# Patient Record
Sex: Female | Born: 1950 | Race: Black or African American | Hispanic: No | Marital: Single | Smoking: Never smoker
Health system: Southern US, Community
[De-identification: ages and names within clinical notes are randomized; demographics above are authoritative.]

## PROBLEM LIST (undated history)

## (undated) DIAGNOSIS — E876 Hypokalemia: Secondary | ICD-10-CM

## (undated) DIAGNOSIS — D649 Anemia, unspecified: Secondary | ICD-10-CM

## (undated) DIAGNOSIS — M545 Low back pain, unspecified: Secondary | ICD-10-CM

## (undated) DIAGNOSIS — H409 Unspecified glaucoma: Secondary | ICD-10-CM

## (undated) DIAGNOSIS — F32A Depression, unspecified: Secondary | ICD-10-CM

## (undated) DIAGNOSIS — M159 Polyosteoarthritis, unspecified: Secondary | ICD-10-CM

## (undated) DIAGNOSIS — M25579 Pain in unspecified ankle and joints of unspecified foot: Secondary | ICD-10-CM

## (undated) DIAGNOSIS — F438 Other reactions to severe stress: Secondary | ICD-10-CM

## (undated) DIAGNOSIS — M79609 Pain in unspecified limb: Secondary | ICD-10-CM

## (undated) DIAGNOSIS — M109 Gout, unspecified: Secondary | ICD-10-CM

## (undated) DIAGNOSIS — C801 Malignant (primary) neoplasm, unspecified: Secondary | ICD-10-CM

## (undated) DIAGNOSIS — J309 Allergic rhinitis, unspecified: Secondary | ICD-10-CM

## (undated) DIAGNOSIS — T7840XA Allergy, unspecified, initial encounter: Secondary | ICD-10-CM

## (undated) DIAGNOSIS — G5600 Carpal tunnel syndrome, unspecified upper limb: Secondary | ICD-10-CM

## (undated) DIAGNOSIS — E785 Hyperlipidemia, unspecified: Secondary | ICD-10-CM

## (undated) DIAGNOSIS — M949 Disorder of cartilage, unspecified: Secondary | ICD-10-CM

## (undated) DIAGNOSIS — E119 Type 2 diabetes mellitus without complications: Secondary | ICD-10-CM

## (undated) DIAGNOSIS — K219 Gastro-esophageal reflux disease without esophagitis: Secondary | ICD-10-CM

## (undated) DIAGNOSIS — M899 Disorder of bone, unspecified: Secondary | ICD-10-CM

## (undated) DIAGNOSIS — I10 Essential (primary) hypertension: Secondary | ICD-10-CM

## (undated) DIAGNOSIS — G629 Polyneuropathy, unspecified: Secondary | ICD-10-CM

## (undated) HISTORY — DX: Pain in unspecified ankle and joints of unspecified foot: M25.579

## (undated) HISTORY — DX: Hyperlipidemia, unspecified: E78.5

## (undated) HISTORY — DX: Gastro-esophageal reflux disease without esophagitis: K21.9

## (undated) HISTORY — DX: Other reactions to severe stress: F43.8

## (undated) HISTORY — PX: TONSILLECTOMY: SUR1361

## (undated) HISTORY — PX: NO PAST SURGERIES: SHX2092

## (undated) HISTORY — DX: Allergy, unspecified, initial encounter: T78.40XA

## (undated) HISTORY — PX: OTHER SURGICAL HISTORY: SHX169

## (undated) HISTORY — DX: Allergic rhinitis, unspecified: J30.9

## (undated) HISTORY — DX: Type 2 diabetes mellitus without complications: E11.9

## (undated) HISTORY — DX: Essential (primary) hypertension: I10

## (undated) HISTORY — DX: Disorder of cartilage, unspecified: M94.9

## (undated) HISTORY — DX: Polyosteoarthritis, unspecified: M15.9

## (undated) HISTORY — DX: Pain in unspecified limb: M79.609

## (undated) HISTORY — DX: Gout, unspecified: M10.9

## (undated) HISTORY — DX: Polyneuropathy, unspecified: G62.9

## (undated) HISTORY — DX: Unspecified glaucoma: H40.9

## (undated) HISTORY — DX: Disorder of bone, unspecified: M89.9

## (undated) HISTORY — PX: COLONOSCOPY: SHX174

## (undated) HISTORY — DX: Carpal tunnel syndrome, unspecified upper limb: G56.00

## (undated) HISTORY — DX: Hypokalemia: E87.6

---

## 1998-04-07 ENCOUNTER — Other Ambulatory Visit: Admission: RE | Admit: 1998-04-07 | Discharge: 1998-04-07 | Payer: Self-pay | Admitting: Obstetrics and Gynecology

## 1998-06-27 ENCOUNTER — Emergency Department (HOSPITAL_COMMUNITY): Admission: EM | Admit: 1998-06-27 | Discharge: 1998-06-27 | Payer: Self-pay | Admitting: Emergency Medicine

## 1999-03-25 ENCOUNTER — Emergency Department (HOSPITAL_COMMUNITY): Admission: EM | Admit: 1999-03-25 | Discharge: 1999-03-25 | Payer: Self-pay | Admitting: Internal Medicine

## 1999-06-27 ENCOUNTER — Emergency Department (HOSPITAL_COMMUNITY): Admission: EM | Admit: 1999-06-27 | Discharge: 1999-06-27 | Payer: Self-pay | Admitting: *Deleted

## 1999-06-28 ENCOUNTER — Emergency Department (HOSPITAL_COMMUNITY): Admission: EM | Admit: 1999-06-28 | Discharge: 1999-06-28 | Payer: Self-pay | Admitting: Emergency Medicine

## 1999-06-28 ENCOUNTER — Encounter: Payer: Self-pay | Admitting: Emergency Medicine

## 1999-06-29 ENCOUNTER — Emergency Department (HOSPITAL_COMMUNITY): Admission: EM | Admit: 1999-06-29 | Discharge: 1999-06-29 | Payer: Self-pay | Admitting: Emergency Medicine

## 1999-07-01 ENCOUNTER — Inpatient Hospital Stay (HOSPITAL_COMMUNITY): Admission: EM | Admit: 1999-07-01 | Discharge: 1999-07-03 | Payer: Self-pay | Admitting: Emergency Medicine

## 1999-07-12 ENCOUNTER — Inpatient Hospital Stay (HOSPITAL_COMMUNITY): Admission: EM | Admit: 1999-07-12 | Discharge: 1999-07-14 | Payer: Self-pay | Admitting: Emergency Medicine

## 1999-07-13 ENCOUNTER — Encounter: Payer: Self-pay | Admitting: Internal Medicine

## 1999-07-19 ENCOUNTER — Inpatient Hospital Stay (HOSPITAL_COMMUNITY): Admission: EM | Admit: 1999-07-19 | Discharge: 1999-07-22 | Payer: Self-pay | Admitting: Emergency Medicine

## 2000-03-18 ENCOUNTER — Emergency Department (HOSPITAL_COMMUNITY): Admission: EM | Admit: 2000-03-18 | Discharge: 2000-03-18 | Payer: Self-pay | Admitting: Emergency Medicine

## 2000-09-20 ENCOUNTER — Other Ambulatory Visit: Admission: RE | Admit: 2000-09-20 | Discharge: 2000-09-20 | Payer: Self-pay | Admitting: Obstetrics and Gynecology

## 2000-12-14 ENCOUNTER — Encounter: Admission: RE | Admit: 2000-12-14 | Discharge: 2001-03-14 | Payer: Self-pay | Admitting: Internal Medicine

## 2001-11-30 ENCOUNTER — Ambulatory Visit (HOSPITAL_BASED_OUTPATIENT_CLINIC_OR_DEPARTMENT_OTHER): Admission: RE | Admit: 2001-11-30 | Discharge: 2001-11-30 | Payer: Self-pay | Admitting: Internal Medicine

## 2001-12-25 ENCOUNTER — Ambulatory Visit (HOSPITAL_COMMUNITY): Admission: RE | Admit: 2001-12-25 | Discharge: 2001-12-25 | Payer: Self-pay | Admitting: Internal Medicine

## 2001-12-25 ENCOUNTER — Encounter: Payer: Self-pay | Admitting: Internal Medicine

## 2002-11-29 ENCOUNTER — Other Ambulatory Visit: Admission: RE | Admit: 2002-11-29 | Discharge: 2002-11-29 | Payer: Self-pay | Admitting: Internal Medicine

## 2004-01-23 ENCOUNTER — Ambulatory Visit: Payer: Self-pay | Admitting: Internal Medicine

## 2004-02-19 ENCOUNTER — Encounter: Admission: RE | Admit: 2004-02-19 | Discharge: 2004-05-19 | Payer: Self-pay | Admitting: Internal Medicine

## 2004-03-17 ENCOUNTER — Ambulatory Visit: Payer: Self-pay | Admitting: Internal Medicine

## 2004-06-10 ENCOUNTER — Encounter: Admission: RE | Admit: 2004-06-10 | Discharge: 2004-09-08 | Payer: Self-pay | Admitting: Internal Medicine

## 2004-12-09 ENCOUNTER — Ambulatory Visit: Payer: Self-pay | Admitting: Internal Medicine

## 2005-01-21 ENCOUNTER — Emergency Department (HOSPITAL_COMMUNITY): Admission: EM | Admit: 2005-01-21 | Discharge: 2005-01-22 | Payer: Self-pay | Admitting: Emergency Medicine

## 2005-03-12 ENCOUNTER — Emergency Department (HOSPITAL_COMMUNITY): Admission: EM | Admit: 2005-03-12 | Discharge: 2005-03-12 | Payer: Self-pay | Admitting: Family Medicine

## 2005-04-13 ENCOUNTER — Emergency Department (HOSPITAL_COMMUNITY): Admission: EM | Admit: 2005-04-13 | Discharge: 2005-04-13 | Payer: Self-pay | Admitting: Family Medicine

## 2005-10-02 ENCOUNTER — Emergency Department (HOSPITAL_COMMUNITY): Admission: AD | Admit: 2005-10-02 | Discharge: 2005-10-02 | Payer: Self-pay | Admitting: Family Medicine

## 2006-02-01 LAB — HM MAMMOGRAPHY: HM Mammogram: NORMAL

## 2006-04-06 ENCOUNTER — Ambulatory Visit: Payer: Self-pay | Admitting: Internal Medicine

## 2006-04-06 LAB — CONVERTED CEMR LAB
ALT: 31 units/L (ref 0–40)
AST: 25 units/L (ref 0–37)
Albumin: 3.6 g/dL (ref 3.5–5.2)
Alkaline Phosphatase: 67 units/L (ref 39–117)
BUN: 5 mg/dL — ABNORMAL LOW (ref 6–23)
Basophils Absolute: 0 10*3/uL (ref 0.0–0.1)
Basophils Relative: 0.6 % (ref 0.0–1.0)
Bilirubin Urine: NEGATIVE
Bilirubin, Direct: 0.2 mg/dL (ref 0.0–0.3)
CO2: 30 meq/L (ref 19–32)
Calcium: 9.3 mg/dL (ref 8.4–10.5)
Chloride: 105 meq/L (ref 96–112)
Cholesterol: 179 mg/dL (ref 0–200)
Creatinine, Ser: 0.7 mg/dL (ref 0.4–1.2)
Eosinophils Absolute: 0.4 10*3/uL (ref 0.0–0.6)
Eosinophils Relative: 6.2 % — ABNORMAL HIGH (ref 0.0–5.0)
GFR calc Af Amer: 113 mL/min
GFR calc non Af Amer: 93 mL/min
Glucose, Bld: 147 mg/dL — ABNORMAL HIGH (ref 70–99)
HCT: 40.4 % (ref 36.0–46.0)
HDL: 40.6 mg/dL (ref >39.0)
Hemoglobin, Urine: NEGATIVE
Hemoglobin: 13.3 g/dL (ref 12.0–15.0)
Ketones, ur: NEGATIVE mg/dL
LDL Cholesterol: 120 mg/dL — ABNORMAL HIGH (ref 0–99)
Leukocytes, UA: NEGATIVE
Lymphocytes Relative: 41.9 % (ref 12.0–46.0)
MCHC: 33 g/dL (ref 30.0–36.0)
MCV: 83.7 fL (ref 78.0–100.0)
Monocytes Absolute: 0.4 10*3/uL (ref 0.2–0.7)
Monocytes Relative: 7.5 % (ref 3.0–11.0)
Neutro Abs: 2.5 10*3/uL (ref 1.4–7.7)
Neutrophils Relative %: 43.8 % (ref 43.0–77.0)
Nitrite: NEGATIVE
Platelets: 231 10*3/uL (ref 150–400)
Potassium: 4.1 meq/L (ref 3.5–5.1)
RBC: 4.83 M/uL (ref 3.87–5.11)
RDW: 13.5 % (ref 11.5–14.6)
Sodium: 142 meq/L (ref 135–145)
Specific Gravity, Urine: 1.025 (ref 1.000–1.03)
TSH: 1.03 microintl units/mL (ref 0.35–5.50)
Total Bilirubin: 1 mg/dL (ref 0.3–1.2)
Total CHOL/HDL Ratio: 4.4
Total Protein, Urine: NEGATIVE mg/dL
Total Protein: 7.4 g/dL (ref 6.0–8.3)
Triglycerides: 93 mg/dL (ref 0–149)
Urine Glucose: NEGATIVE mg/dL
Urobilinogen, UA: 0.2 (ref 0.0–1.0)
VLDL: 19 mg/dL (ref 0–40)
WBC: 5.7 10*3/uL (ref 4.5–10.5)
pH: 6 (ref 5.0–8.0)

## 2006-04-13 ENCOUNTER — Ambulatory Visit: Payer: Self-pay | Admitting: Internal Medicine

## 2006-05-05 ENCOUNTER — Ambulatory Visit: Payer: Self-pay | Admitting: Internal Medicine

## 2006-05-20 ENCOUNTER — Ambulatory Visit: Payer: Self-pay | Admitting: Family Medicine

## 2006-08-10 ENCOUNTER — Emergency Department (HOSPITAL_COMMUNITY): Admission: EM | Admit: 2006-08-10 | Discharge: 2006-08-10 | Payer: Self-pay | Admitting: Family Medicine

## 2006-11-15 DIAGNOSIS — G5600 Carpal tunnel syndrome, unspecified upper limb: Secondary | ICD-10-CM | POA: Insufficient documentation

## 2006-11-15 DIAGNOSIS — K219 Gastro-esophageal reflux disease without esophagitis: Secondary | ICD-10-CM | POA: Insufficient documentation

## 2006-11-15 DIAGNOSIS — E1140 Type 2 diabetes mellitus with diabetic neuropathy, unspecified: Secondary | ICD-10-CM

## 2006-11-15 DIAGNOSIS — I10 Essential (primary) hypertension: Secondary | ICD-10-CM

## 2006-11-15 DIAGNOSIS — E119 Type 2 diabetes mellitus without complications: Secondary | ICD-10-CM

## 2006-11-15 HISTORY — DX: Type 2 diabetes mellitus without complications: E11.9

## 2006-11-15 HISTORY — DX: Essential (primary) hypertension: I10

## 2006-11-15 HISTORY — DX: Carpal tunnel syndrome, unspecified upper limb: G56.00

## 2006-11-15 HISTORY — DX: Gastro-esophageal reflux disease without esophagitis: K21.9

## 2007-05-01 ENCOUNTER — Ambulatory Visit: Payer: Self-pay | Admitting: Internal Medicine

## 2007-05-01 ENCOUNTER — Telehealth (INDEPENDENT_AMBULATORY_CARE_PROVIDER_SITE_OTHER): Payer: Self-pay | Admitting: *Deleted

## 2007-05-01 DIAGNOSIS — E785 Hyperlipidemia, unspecified: Secondary | ICD-10-CM

## 2007-05-01 DIAGNOSIS — E782 Mixed hyperlipidemia: Secondary | ICD-10-CM | POA: Insufficient documentation

## 2007-05-01 DIAGNOSIS — J309 Allergic rhinitis, unspecified: Secondary | ICD-10-CM

## 2007-05-01 HISTORY — DX: Allergic rhinitis, unspecified: J30.9

## 2007-05-01 HISTORY — DX: Hyperlipidemia, unspecified: E78.5

## 2007-05-24 ENCOUNTER — Ambulatory Visit: Payer: Self-pay | Admitting: Internal Medicine

## 2007-05-24 LAB — CONVERTED CEMR LAB
ALT: 27 units/L (ref 0–35)
AST: 21 units/L (ref 0–37)
Albumin: 3.3 g/dL — ABNORMAL LOW (ref 3.5–5.2)
Alkaline Phosphatase: 71 units/L (ref 39–117)
BUN: 7 mg/dL (ref 6–23)
Basophils Absolute: 0 10*3/uL (ref 0.0–0.1)
Basophils Relative: 0.1 % (ref 0.0–1.0)
Bilirubin Urine: NEGATIVE
Bilirubin, Direct: 0.1 mg/dL (ref 0.0–0.3)
CO2: 31 meq/L (ref 19–32)
Calcium: 9.2 mg/dL (ref 8.4–10.5)
Chloride: 103 meq/L (ref 96–112)
Cholesterol: 188 mg/dL (ref 0–200)
Creatinine, Ser: 0.7 mg/dL (ref 0.4–1.2)
Creatinine,U: 30.2 mg/dL
Eosinophils Absolute: 0.3 10*3/uL (ref 0.0–0.7)
Eosinophils Relative: 6.1 % — ABNORMAL HIGH (ref 0.0–5.0)
GFR calc Af Amer: 112 mL/min
GFR calc non Af Amer: 93 mL/min
Glucose, Bld: 193 mg/dL — ABNORMAL HIGH (ref 70–99)
HCT: 36.5 % (ref 36.0–46.0)
HDL: 41.7 mg/dL (ref >39.0)
Hemoglobin, Urine: NEGATIVE
Hemoglobin: 11.9 g/dL — ABNORMAL LOW (ref 12.0–15.0)
Hgb A1c MFr Bld: 9.1 % — ABNORMAL HIGH (ref 4.6–6.0)
Ketones, ur: NEGATIVE mg/dL
LDL Cholesterol: 121 mg/dL — ABNORMAL HIGH (ref 0–99)
Leukocytes, UA: NEGATIVE
Lymphocytes Relative: 44.1 % (ref 12.0–46.0)
MCHC: 32.8 g/dL (ref 30.0–36.0)
MCV: 85.4 fL (ref 78.0–100.0)
Microalb Creat Ratio: 9.9 mg/g (ref 0.0–30.0)
Microalb, Ur: 0.3 mg/dL (ref 0.0–1.9)
Monocytes Absolute: 0.4 10*3/uL (ref 0.1–1.0)
Monocytes Relative: 7.3 % (ref 3.0–12.0)
Neutro Abs: 2.2 10*3/uL (ref 1.4–7.7)
Neutrophils Relative %: 42.4 % — ABNORMAL LOW (ref 43.0–77.0)
Nitrite: NEGATIVE
Platelets: 194 10*3/uL (ref 150–400)
Potassium: 3.6 meq/L (ref 3.5–5.1)
RBC: 4.27 M/uL (ref 3.87–5.11)
RDW: 12.9 % (ref 11.5–14.6)
Sodium: 142 meq/L (ref 135–145)
Specific Gravity, Urine: 1.01 (ref 1.000–1.03)
TSH: 1.18 microintl units/mL (ref 0.35–5.50)
Total Bilirubin: 0.5 mg/dL (ref 0.3–1.2)
Total CHOL/HDL Ratio: 4.5
Total Protein, Urine: NEGATIVE mg/dL
Total Protein: 7.2 g/dL (ref 6.0–8.3)
Triglycerides: 127 mg/dL (ref 0–149)
Urine Glucose: NEGATIVE mg/dL
Urobilinogen, UA: 0.2 (ref 0.0–1.0)
VLDL: 25 mg/dL (ref 0–40)
WBC: 5.2 10*3/uL (ref 4.5–10.5)
pH: 6.5 (ref 5.0–8.0)

## 2007-06-12 ENCOUNTER — Telehealth: Payer: Self-pay | Admitting: Internal Medicine

## 2007-06-12 ENCOUNTER — Ambulatory Visit: Payer: Self-pay | Admitting: Internal Medicine

## 2008-01-01 ENCOUNTER — Emergency Department (HOSPITAL_COMMUNITY): Admission: EM | Admit: 2008-01-01 | Discharge: 2008-01-01 | Payer: Self-pay | Admitting: Emergency Medicine

## 2008-02-14 ENCOUNTER — Emergency Department (HOSPITAL_COMMUNITY): Admission: EM | Admit: 2008-02-14 | Discharge: 2008-02-14 | Payer: Self-pay | Admitting: Family Medicine

## 2008-05-03 ENCOUNTER — Emergency Department (HOSPITAL_COMMUNITY): Admission: EM | Admit: 2008-05-03 | Discharge: 2008-05-03 | Payer: Self-pay | Admitting: Emergency Medicine

## 2008-07-03 ENCOUNTER — Ambulatory Visit: Payer: Self-pay | Admitting: Internal Medicine

## 2008-07-03 ENCOUNTER — Encounter: Payer: Self-pay | Admitting: Internal Medicine

## 2008-07-04 ENCOUNTER — Ambulatory Visit: Payer: Self-pay | Admitting: Internal Medicine

## 2008-07-04 DIAGNOSIS — H409 Unspecified glaucoma: Secondary | ICD-10-CM

## 2008-07-04 HISTORY — DX: Unspecified glaucoma: H40.9

## 2008-07-04 LAB — CONVERTED CEMR LAB
ALT: 34 units/L (ref 0–35)
AST: 27 units/L (ref 0–37)
Albumin: 3.7 g/dL (ref 3.5–5.2)
Alkaline Phosphatase: 72 units/L (ref 39–117)
BUN: 12 mg/dL (ref 6–23)
Basophils Absolute: 0 10*3/uL (ref 0.0–0.1)
Basophils Relative: 0.5 % (ref 0.0–3.0)
Bilirubin Urine: NEGATIVE
Bilirubin, Direct: 0.1 mg/dL (ref 0.0–0.3)
CO2: 33 meq/L — ABNORMAL HIGH (ref 19–32)
Calcium: 9.5 mg/dL (ref 8.4–10.5)
Chloride: 106 meq/L (ref 96–112)
Cholesterol: 205 mg/dL — ABNORMAL HIGH (ref 0–200)
Creatinine, Ser: 0.7 mg/dL (ref 0.4–1.2)
Creatinine,U: 178.3 mg/dL
Direct LDL: 142.4 mg/dL
Eosinophils Absolute: 0.3 10*3/uL (ref 0.0–0.7)
Eosinophils Relative: 4.2 % (ref 0.0–5.0)
GFR calc non Af Amer: 111.3 mL/min (ref >60)
Glucose, Bld: 126 mg/dL — ABNORMAL HIGH (ref 70–99)
HCT: 40.6 % (ref 36.0–46.0)
HDL: 48.9 mg/dL (ref >39.00)
Hemoglobin, Urine: NEGATIVE
Hemoglobin: 13.7 g/dL (ref 12.0–15.0)
Hgb A1c MFr Bld: 8 % — ABNORMAL HIGH (ref 4.6–6.5)
Ketones, ur: NEGATIVE mg/dL
Leukocytes, UA: NEGATIVE
Lymphocytes Relative: 44.6 % (ref 12.0–46.0)
Lymphs Abs: 3.3 10*3/uL (ref 0.7–4.0)
MCHC: 33.7 g/dL (ref 30.0–36.0)
MCV: 85.3 fL (ref 78.0–100.0)
Microalb Creat Ratio: 5 mg/g (ref 0.0–30.0)
Microalb, Ur: 0.9 mg/dL (ref 0.0–1.9)
Monocytes Absolute: 0.5 10*3/uL (ref 0.1–1.0)
Monocytes Relative: 6.1 % (ref 3.0–12.0)
Neutro Abs: 3.3 10*3/uL (ref 1.4–7.7)
Neutrophils Relative %: 44.6 % (ref 43.0–77.0)
Nitrite: NEGATIVE
Platelets: 196 10*3/uL (ref 150.0–400.0)
Potassium: 3.5 meq/L (ref 3.5–5.1)
RBC: 4.76 M/uL (ref 3.87–5.11)
RDW: 13.2 % (ref 11.5–14.6)
Sodium: 145 meq/L (ref 135–145)
Specific Gravity, Urine: >=1.03 (ref 1.000–1.030)
TSH: 0.82 microintl units/mL (ref 0.35–5.50)
Total Bilirubin: 0.7 mg/dL (ref 0.3–1.2)
Total CHOL/HDL Ratio: 4
Total Protein, Urine: NEGATIVE mg/dL
Total Protein: 8.2 g/dL (ref 6.0–8.3)
Triglycerides: 109 mg/dL (ref 0.0–149.0)
Urine Glucose: NEGATIVE mg/dL
Urobilinogen, UA: 0.2 (ref 0.0–1.0)
VLDL: 21.8 mg/dL (ref 0.0–40.0)
WBC: 7.4 10*3/uL (ref 4.5–10.5)
pH: 6 (ref 5.0–8.0)

## 2008-09-26 ENCOUNTER — Ambulatory Visit: Payer: Self-pay | Admitting: Internal Medicine

## 2008-09-27 LAB — CONVERTED CEMR LAB
BUN: 7 mg/dL (ref 6–23)
CO2: 32 meq/L (ref 19–32)
Calcium: 9.4 mg/dL (ref 8.4–10.5)
Chloride: 103 meq/L (ref 96–112)
Cholesterol: 181 mg/dL (ref 0–200)
Creatinine, Ser: 0.7 mg/dL (ref 0.4–1.2)
GFR calc non Af Amer: 111.21 mL/min (ref >60)
Glucose, Bld: 126 mg/dL — ABNORMAL HIGH (ref 70–99)
HDL: 41.6 mg/dL (ref >39.00)
Hgb A1c MFr Bld: 7.4 % — ABNORMAL HIGH (ref 4.6–6.5)
LDL Cholesterol: 125 mg/dL — ABNORMAL HIGH (ref 0–99)
Potassium: 3.2 meq/L — ABNORMAL LOW (ref 3.5–5.1)
Sodium: 141 meq/L (ref 135–145)
Total CHOL/HDL Ratio: 4
Triglycerides: 70 mg/dL (ref 0.0–149.0)
VLDL: 14 mg/dL (ref 0.0–40.0)

## 2008-10-02 ENCOUNTER — Ambulatory Visit: Payer: Self-pay | Admitting: Internal Medicine

## 2008-10-02 DIAGNOSIS — E876 Hypokalemia: Secondary | ICD-10-CM

## 2008-10-02 HISTORY — DX: Hypokalemia: E87.6

## 2009-03-11 ENCOUNTER — Encounter: Payer: Self-pay | Admitting: Internal Medicine

## 2009-03-12 ENCOUNTER — Telehealth: Payer: Self-pay | Admitting: Internal Medicine

## 2009-03-31 ENCOUNTER — Ambulatory Visit: Payer: Self-pay | Admitting: Internal Medicine

## 2009-03-31 LAB — CONVERTED CEMR LAB
Chloride: 106 meq/L (ref 96–112)
Cholesterol: 192 mg/dL (ref 0–200)
Creatinine, Ser: 0.7 mg/dL (ref 0.4–1.2)
HDL: 56.8 mg/dL (ref >39.00)
LDL Cholesterol: 121 mg/dL — ABNORMAL HIGH (ref 0–99)
Potassium: 4.2 meq/L (ref 3.5–5.1)
Sodium: 144 meq/L (ref 135–145)
Triglycerides: 69 mg/dL (ref 0.0–149.0)

## 2009-04-02 ENCOUNTER — Ambulatory Visit: Payer: Self-pay | Admitting: Internal Medicine

## 2009-04-02 DIAGNOSIS — M79609 Pain in unspecified limb: Secondary | ICD-10-CM

## 2009-04-02 HISTORY — DX: Pain in unspecified limb: M79.609

## 2009-07-10 ENCOUNTER — Telehealth: Payer: Self-pay | Admitting: Internal Medicine

## 2009-07-18 ENCOUNTER — Emergency Department (HOSPITAL_COMMUNITY): Admission: EM | Admit: 2009-07-18 | Discharge: 2009-07-18 | Payer: Self-pay | Admitting: Family Medicine

## 2009-07-21 ENCOUNTER — Encounter: Payer: Self-pay | Admitting: Internal Medicine

## 2009-07-23 ENCOUNTER — Encounter: Payer: Self-pay | Admitting: Internal Medicine

## 2009-09-02 ENCOUNTER — Encounter: Payer: Self-pay | Admitting: Internal Medicine

## 2009-09-02 ENCOUNTER — Ambulatory Visit: Payer: Self-pay | Admitting: Internal Medicine

## 2009-09-02 DIAGNOSIS — M949 Disorder of cartilage, unspecified: Secondary | ICD-10-CM

## 2009-09-02 DIAGNOSIS — M899 Disorder of bone, unspecified: Secondary | ICD-10-CM

## 2009-09-02 DIAGNOSIS — M25579 Pain in unspecified ankle and joints of unspecified foot: Secondary | ICD-10-CM

## 2009-09-02 DIAGNOSIS — F4389 Other reactions to severe stress: Secondary | ICD-10-CM

## 2009-09-02 DIAGNOSIS — F438 Other reactions to severe stress: Secondary | ICD-10-CM

## 2009-09-02 HISTORY — DX: Other reactions to severe stress: F43.89

## 2009-09-02 HISTORY — DX: Disorder of bone, unspecified: M89.9

## 2009-09-02 HISTORY — DX: Pain in unspecified ankle and joints of unspecified foot: M25.579

## 2009-09-02 HISTORY — DX: Other reactions to severe stress: F43.8

## 2009-09-02 LAB — CONVERTED CEMR LAB
BUN: 11 mg/dL (ref 6–23)
Basophils Absolute: 0 10*3/uL (ref 0.0–0.1)
Cholesterol: 203 mg/dL — ABNORMAL HIGH (ref 0–200)
Creatinine,U: 175.1 mg/dL
Direct LDL: 136 mg/dL
GFR calc non Af Amer: 122.91 mL/min (ref >60)
Glucose, Bld: 114 mg/dL — ABNORMAL HIGH (ref 70–99)
HCT: 40.3 % (ref 36.0–46.0)
HDL: 50.2 mg/dL (ref >39.00)
Hemoglobin, Urine: NEGATIVE
Ketones, ur: NEGATIVE mg/dL
Lymphs Abs: 2.2 10*3/uL (ref 0.7–4.0)
MCV: 85.5 fL (ref 78.0–100.0)
Monocytes Absolute: 0.4 10*3/uL (ref 0.1–1.0)
Monocytes Relative: 7.5 % (ref 3.0–12.0)
Platelets: 215 10*3/uL (ref 150.0–400.0)
Potassium: 4.2 meq/L (ref 3.5–5.1)
RDW: 15.2 % — ABNORMAL HIGH (ref 11.5–14.6)
Total Bilirubin: 0.6 mg/dL (ref 0.3–1.2)
Total CHOL/HDL Ratio: 4
Triglycerides: 84 mg/dL (ref 0.0–149.0)
Urine Glucose: NEGATIVE mg/dL
Urobilinogen, UA: 0.2 (ref 0.0–1.0)
VLDL: 16.8 mg/dL (ref 0.0–40.0)

## 2010-02-14 ENCOUNTER — Emergency Department (HOSPITAL_COMMUNITY)
Admission: EM | Admit: 2010-02-14 | Discharge: 2010-02-14 | Payer: Self-pay | Source: Home / Self Care | Admitting: Family Medicine

## 2010-03-02 ENCOUNTER — Ambulatory Visit: Admit: 2010-03-02 | Payer: Self-pay | Admitting: Internal Medicine

## 2010-03-05 NOTE — Medication Information (Signed)
Summary: [facility] [employer] for Diovan/[facility]  [facility] [employer] for Diovan/[facility]   Imported By: Sherian Rein 07/23/2009 11:11:36  _____________________________________________________________________  External Attachment:    Type:   Image     Comment:   External Document

## 2010-03-05 NOTE — Medication Information (Signed)
Summary: Diovan DENIED/[facility]  Diovan DENIED/[facility]   Imported By: Sherian Rein 07/28/2009 09:19:18  _____________________________________________________________________  External Attachment:    Type:   Image     Comment:   External Document

## 2010-03-05 NOTE — Medication Information (Signed)
Summary: Diovan DENIED/[facility]  Diovan DENIED/[facility]   Imported By: Sherian Rein 07/25/2009 12:06:15  _____________________________________________________________________  External Attachment:    Type:   Image     Comment:   External Document

## 2010-03-05 NOTE — Letter (Signed)
Summary: Optometrist Clarene Critchley  Optometrist Charlie Byrnes   Imported By: Lester Anson 03/14/2009 07:34:44  _____________________________________________________________________  External Attachment:    Type:   Image     Comment:   External Document

## 2010-03-05 NOTE — Miscellaneous (Signed)
Summary: Orders Update  Clinical Lists Changes  Problems: Added new problem of ANKLE PAIN, RIGHT (ICD-719.47) Orders: Added new Test order of T-Ankle Comp Right (73610TC) - Signed

## 2010-03-05 NOTE — Progress Notes (Signed)
Summary: PA Diovan  Phone Note Call from Patient Call back at Home Phone [phone]   Caller: Patient [phone] Summary of Call: pt called stating PA is needed fo BP med Diovan and she is completely out. I called pt and advised that PA will take a few days but we can change RX or she may get 2 week sample while PA gets worked out. Requested pt call back. Initial call taken by: Margaret Pyle, CMA,  July 10, 2009 4:02 PM  Follow-up for Phone Call        Case is not available via [email or web address]  Spoke with [employer] and the preferred alternatives are Prinzide and Lotensin HCT. Please advise.  [employer] [phone] ID:  [id number] Follow-up by: Lucious Groves,  July 11, 2009 10:04 AM  Additional Follow-up for Phone Call Additional follow up Details #1::        ok for samples, and ok to try the PA  pt informed via VM, samples in cabinet for pt pick up Margaret Pyle, CMA  July 14, 2009 11:55 AM  Additional Follow-up by: Corwin Levins MD,  July 11, 2009 10:58 AM    Additional Follow-up for Phone Call Additional follow up Details #2::    [employer] ID: [id number] Form has been requested from [employer]. Lucious Groves  July 16, 2009 3:06 PM   Form requested again. Lucious Groves  July 18, 2009 10:25 AM   I was notified via fax that [employer] does not do this prior authorization and I should call [phone]. Spoke with insurance company and was transferred to [phone].  They will fax form. Lucious Groves  July 21, 2009 9:50 AM   Form completed and faxed, will await insurance company reply. Lucious Groves  July 21, 2009 3:28 PM    Appended Document: PA Diovan PA has been denied, please advise.  Appended Document: PA Diovan is there another ARB such as micardis, benicar, atacand , or losartan that is covered/?  Appended Document: PA Diovan Benicar and Micardis are covered without prior authorization.

## 2010-03-05 NOTE — Assessment & Plan Note (Signed)
Summary: 6 MO ROV /NWS $50   Vital Signs:  Patient profile:   60 year old female Height:      62 inches Weight:      241.25 pounds BMI:     44.28 O2 Sat:      98 % on Room air Temp:     97.1 degrees F oral Pulse rate:   82 / minute BP sitting:   140 / 82  (left arm) Cuff size:   large  Vitals Entered ByZella Ball Ewing (April 02, 2009 1:25 PM)  O2 Flow:  Room air  CC: 6 Mo ROV/RE   CC:  6 Mo ROV/RE.  History of Present Illness: has not yet taken her diovan hct today, BP at work usually < 130.90;  Pt denies CP, sob, doe, wheezing, orthopnea, pnd, worsening LE edema, palps, dizziness or syncope  Pt denies new neuro symptoms such as headache, facial or extremity weakness   Pt denies polydipsia, polyuria, or low sugar symptoms such as shakiness improved with eating.  Overall good compliance with meds, trying to follow low chol, DM diet, wt stable, little excercise however Unfort right thumb is not painful and swollen at the distal joint and cannot use now as it gets stuck in flexion. Had to stop the lovastatin due to myalgias.   Preventive Screening-Counseling & Management      Drug Use:  no.    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 1po Once Daily 2)  Actos 45 Mg Tabs (Pioglitazone Hcl) .Marland Kitchen.. 1 By Mouth Once Daily 3)  Lovastatin 40 Mg Tabs (Lovastatin) .... Take 1 Tablet By Mouth At Night 4)  Diovan Hct 320-25 Mg Tabs (Valsartan-Hydrochlorothiazide) .Marland Kitchen.. 1po Once Daily 5)  Adult  Aspirin Ec Low Strength 81 Mg  Tbec (Aspirin) .Marland Kitchen.. 1 By Mouth Qd 6)  Cetirizine Hcl 10 Mg Tabs (Cetirizine Hcl) .Marland Kitchen.. 1 By Mouth Once Daily 7)  Klor-Con 10 10 Meq Cr-Tabs (Potassium Chloride) .Marland Kitchen.. 1 By Mouth Once Daily  Current Medications (verified): 1)  Pantoprazole Sodium 40 Mg  Tbec (Pantoprazole Sodium) .Marland Kitchen.. 1po Once Daily 2)  Actos 45 Mg Tabs (Pioglitazone Hcl) .Marland Kitchen.. 1 By Mouth Once Daily 3)  Pravachol 20 Mg Tabs (Pravastatin Sodium) .Marland Kitchen.. 1 By Mouth Once Daily 4)  Diovan Hct 320-25 Mg Tabs (Valsartan-Hydrochlorothiazide) .Marland Kitchen.. 1po Once Daily 5)  Adult Aspirin Ec Low Strength 81 Mg  Tbec (Aspirin) .Marland Kitchen.. 1 By Mouth Qd 6)  Cetirizine Hcl 10 Mg Tabs (Cetirizine Hcl) .Marland Kitchen.. 1 By Mouth Once Daily 7)  Klor-Con 10 10 Meq Cr-Tabs (Potassium Chloride) .Marland Kitchen.. 1 By Mouth Once Daily 8)  Glimepiride 1 Mg Tabs (Glimepiride) .Marland Kitchen.. 1 By Mouth Once Daily  Allergies (verified): 1)  ! 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 brother with throat cancer/smoker heart disease HTN 2 uncles with lung cancer/smioking breast cancer  Social History: Reviewed history from 05/01/2007 and no changes required. 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   Head:  normocephalic and  atraumatic.   Eyes:  vision grossly intact and pupils reactive to light.   Ears:  R ear normal and L ear normal.   Nose:  no external deformity and no nasal discharge.   Mouth:  no gingival abnormalities and pharynx pink and moist.   Neck:  supple and no masses.   Lungs:  normal respiratory effort and normal breath sounds.   Heart:  normal rate and regular rhythm.   Msk:  right thumb with DIP "click" and pain with flexion/extension o/w neurovasc intact Extremities:  no edema, no erythema    Impression & Recommendations:  Problem # 1:  DIABETES MELLITUS, TYPE II (ICD-250.00)  Her updated medication list for this problem includes:    Actos 45 Mg Tabs (Pioglitazone hcl) .Marland Kitchen... 1 by mouth once daily    Diovan Hct 320-25 Mg Tabs (Valsartan-hydrochlorothiazide) .Marland Kitchen... 1po once daily    Adult Aspirin Ec Low Strength 81 Mg Tbec (Aspirin) .Marland Kitchen... 1 by mouth qd    Glimepiride 1 Mg Tabs (Glimepiride) .Marland Kitchen... 1 by mouth once daily uncontrolled - to add the low dose glimeparide 1 mg   Labs Reviewed: Creat: 0.7 (03/31/2009)    Reviewed HgBA1c results: 7.3 (03/31/2009)  7.4 (09/26/2008)  Problem # 2:  HYPERLIPIDEMIA (ICD-272.4)  Her updated medication list for this problem includes:    Pravachol 20 Mg Tabs (Pravastatin sodium) .Marland Kitchen... 1 by mouth once daily to stop the lovastatin due to leg cramps; start the pravastatin 20 mg per day  Labs Reviewed: SGOT: 27 (07/04/2008)   SGPT: 34 (07/04/2008)   HDL:56.80 (03/31/2009), 41.60 (09/26/2008)  LDL:121 (03/31/2009), 125 (09/26/2008)  Chol:192 (03/31/2009), 181 (09/26/2008)  Trig:69.0 (03/31/2009), 70.0 (09/26/2008)  Problem # 3:  HYPERTENSION (ICD-401.9)  Her updated medication list for this problem includes:    Diovan Hct 320-25 Mg Tabs (Valsartan-hydrochlorothiazide) .Marland Kitchen... 1po once daily  BP today: 140/82 Prior BP: 162/106 (10/02/2008)  Labs Reviewed: K+: 4.2 (03/31/2009) Creat: : 0.7 (03/31/2009)   Chol: 192 (03/31/2009)   HDL: 56.80  (03/31/2009)   LDL: 121 (03/31/2009)   TG: 69.0 (03/31/2009) stable overall by hx and exam, ok to continue meds/tx as is   Problem # 4:  THUMB PAIN, RIGHT (ICD-729.5)  will need hand ortho referral  Orders: Orthopedic Referral (Ortho)  Complete Medication List: 1)  Pantoprazole Sodium 40 Mg Tbec (Pantoprazole sodium) .Marland Kitchen.. 1po once daily 2)  Actos 45 Mg Tabs (Pioglitazone hcl) .Marland Kitchen.. 1 by mouth once daily 3)  Pravachol 20 Mg Tabs (Pravastatin sodium) .Marland Kitchen.. 1 by mouth once daily 4)  Diovan Hct 320-25 Mg Tabs (Valsartan-hydrochlorothiazide) .Marland Kitchen.. 1po once daily 5)  Adult Aspirin Ec Low Strength 81 Mg Tbec (Aspirin) .Marland Kitchen.. 1 by mouth qd 6)  Cetirizine Hcl 10 Mg Tabs (Cetirizine hcl) .Marland Kitchen.. 1 by mouth once daily 7)  Klor-con 10 10 Meq Cr-tabs (Potassium chloride) .Marland KitchenMarland KitchenMarland Kitchen  1 by mouth once daily 8)  Glimepiride 1 Mg Tabs (Glimepiride) .Marland Kitchen.. 1 by mouth once daily  Patient Instructions: 1)  You will be contacted about the referral(s) to: hand surgeon 2)  Please take all new medications as prescribed 3)  Continue all previous medications as before this visit  4)  Please schedule a follow-up appointment in 6 months.with: CPX labs and: 5)  HbgA1C prior to visit, ICD-9: 250.02 6)  Urine Microalbumin prior to visit, ICD-9: Prescriptions: GLIMEPIRIDE 1 MG TABS (GLIMEPIRIDE) 1 by mouth once daily  #90 x 3   Entered and Authorized by:   Corwin Levins MD   Signed by:   Corwin Levins MD on 04/02/2009   Method used:   Electronically to        [facility] [address] [phone]* (retail)       [address]       [address]       [place], [place]  [address]       Ph: [phone]       Fax: [phone]   RxID:   [id number] PRAVACHOL 20 MG TABS (PRAVASTATIN SODIUM) 1 by mouth once daily  #90 x 3   Entered and Authorized by:   Corwin Levins MD   Signed by:   Corwin Levins MD on 04/02/2009   Method used:   Electronically to        [facility] [phone]* (retail)       [address]       [address]       [place], [place]  [address]       Ph: [phone]       Fax: [phone]   RxID:   [id number]

## 2010-03-05 NOTE — Assessment & Plan Note (Signed)
Summary: DISCUSS PAPERWORK /NWS   Vital Signs:  Patient profile:   60 year old female Height:      63 inches Weight:      241.25 pounds BMI:     42.89 O2 Sat:      97 % on Room air Temp:     98.2 degrees F oral Pulse rate:   82 / minute BP sitting:   126 / 82  (left arm) Cuff size:   large  Vitals Entered By: Zella Ball Ewing CMA Duncan Dull) (September 02, 2009 8:13 AM)  O2 Flow:  Room air  Preventive Care Screening     declines colonsocpy due to cost of copay  CC: Discuss note for work, right ankle pain/RE   CC:  Discuss note for work and right ankle pain/RE.  History of Present Illness: here - "having trouble with my job"  - her employer is requiring firther trainng classes which involve hoomework, even online , often late at night, eyes hurt, head pounds,  tearful today,  "it's stressing me out"  Tried going on campus as well.  Also misstepped  and twisted the right ankle.  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  Denies significant depressive symtpoms or suicidal ideation.  No panic.  Her full time job is trying to attain a 5 star rating and requiring all to attend classes.  Many her age have quit.  She cant quit her jobs due to finances.  Stays tired all the time and just  .  No osa symptoms, lost 15 lbs since early 2009 RIght ankle still painful medially after twisting the ankle,  has some swelling but no bruising, stil llimping though.  Gets to sit at her job at [employer], but does stand and walk at the daycare. Pt denies CP, sob, doe, wheezing, orthopnea, pnd, worsening LE edema, palps, dizziness or syncope  Pt denies new neuro symptoms such as headache, facial or extremity weakness Pt denies polydipsia, polyuria, or low sugar symptoms such as shakiness improved with eating.  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 1po Once Daily 2)  Actos 45 Mg Tabs (Pioglitazone Hcl) .Marland Kitchen.. 1 By Mouth Once Daily 3)  Pravachol 20 Mg Tabs (Pravastatin Sodium) .Marland Kitchen.. 1 By Mouth Once Daily 4)  Benicar Hct 40-25 Mg Tabs (Olmesartan Medoxomil-Hctz) .Marland Kitchen.. 1po Once Daily 5)  Adult Aspirin Ec Low Strength 81 Mg  Tbec (Aspirin) .Marland Kitchen.. 1 By Mouth Qd 6)  Cetirizine Hcl 10 Mg Tabs (Cetirizine Hcl) .Marland Kitchen.. 1 By Mouth Once Daily 7)  Klor-Con 10 10 Meq Cr-Tabs (Potassium Chloride) .Marland Kitchen.. 1 By Mouth Once Daily 8)  Glimepiride 1 Mg Tabs (Glimepiride) .Marland Kitchen.. 1 By Mouth Once Daily  Current Medications (verified): 1)  Pantoprazole Sodium 40 Mg  Tbec (Pantoprazole Sodium) .Marland Kitchen.. 1po Once Daily 2)  Actos 45 Mg Tabs (Pioglitazone Hcl) .Marland Kitchen.. 1 By Mouth Once  Daily 3)  Pravachol 20 Mg Tabs (Pravastatin Sodium) .Marland Kitchen.. 1 By Mouth Once Daily 4)  Benicar Hct 40-25 Mg Tabs (Olmesartan Medoxomil-Hctz) .Marland Kitchen.. 1po Once Daily 5)  Adult Aspirin Ec Low Strength 81 Mg  Tbec (Aspirin) .Marland Kitchen.. 1 By Mouth Qd 6)  Cetirizine Hcl 10 Mg Tabs (Cetirizine Hcl) .Marland Kitchen.. 1 By Mouth Once Daily 7)  Klor-Con 10 10 Meq Cr-Tabs (Potassium Chloride) .Marland Kitchen.. 1 By Mouth Once Daily 8)  Glimepiride 1 Mg Tabs (Glimepiride) .Marland Kitchen.. 1 By Mouth Once Daily  Allergies (verified): 1)  ! 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 Divorced 3 children Drug use-no  Risk Factors: Smoking Status: never (05/01/2007)  Past Medical History: GERD Hypertension Diabetes mellitus, type II Hyperlipidemia right knee DJD right CTS Allergic rhinitis glaucoma Osteopenia  Review of Systems  The patient denies anorexia, fever, vision loss, decreased hearing, hoarseness, chest pain, syncope, dyspnea on exertion, peripheral edema, prolonged cough, headaches, hemoptysis, abdominal pain, melena, hematochezia, severe indigestion/heartburn, hematuria, muscle weakness, suspicious skin lesions, transient blindness, difficulty walking, depression, unusual weight change, abnormal bleeding, enlarged lymph nodes, and angioedema.         all otherwise negative per pt -    Physical Exam  General:  alert and overweight-appearing.   Head:  normocephalic and atraumatic.   Eyes:  vision grossly intact, pupils equal, and pupils round.   Ears:  R ear normal and L ear normal.   Nose:  no external deformity and no nasal discharge.   Mouth:  good dentition, no gingival abnormalities, and pharynx pink and moist.   Neck:  supple and no masses.   Lungs:  normal respiratory effort and normal breath sounds.   Heart:  normal rate and regular rhythm.   Abdomen:  soft, non-tender, and normal bowel sounds.   Msk:  medial tender and mild swelling to the area just distal to the medial malleolus Extremities:  no edema, no erythema  Neurologic:  cranial nerves II-XII intact and strength normal in all extremities.   Skin:  color normal and no rashes.   Psych:  memory intact for recent and remote and normally interactive.  , but severe anxious and tearful   Impression & Recommendations:  Problem # 1:  Preventive Health Care (ICD-V70.0)  Overall doing well, age appropriate education and counseling updated and  referral for appropriate preventive services done unless declined, immunizations up to date or declined, diet counseling done if overweight, urged to quit smoking if smokes , most recent labs reviewed and current ordered if appropriate, ecg reviewed or declined (interpretation per ECG scanned in the EMR if done); information regarding Medicare Prevention requirements given if appropriate; speciality referrals updated as appropriate   Orders: TLB-BMP (Basic Metabolic Panel-BMET) (80048-METABOL) TLB-CBC Platelet - w/Differential (85025-CBCD) TLB-Hepatic/Liver Function Pnl (80076-HEPATIC) TLB-Lipid Panel (80061-LIPID) TLB-TSH (Thyroid Stimulating Hormone) (84443-TSH) TLB-Udip ONLY (81003-UDIP)  Problem # 2:  DIABETES MELLITUS, TYPE II (ICD-250.00)  Her updated medication list for this problem includes:    Actos 45 Mg Tabs (Pioglitazone hcl) .Marland Kitchen... 1 by mouth once daily    Benicar Hct 40-25 Mg Tabs (Olmesartan medoxomil-hctz) .Marland Kitchen... 1po once daily    Adult Aspirin Ec Low Strength 81 Mg Tbec (Aspirin) .Marland KitchenMarland KitchenMarland KitchenMarland Kitchen  1 by mouth qd    Glimepiride 1 Mg Tabs (Glimepiride) .Marland Kitchen... 1 by mouth once daily  Labs Reviewed: Creat: 0.7 (03/31/2009)    Reviewed HgBA1c results: 7.3 (03/31/2009)  7.4 (09/26/2008) stable overall by hx and exam, ok to continue meds/tx as is . Pt to cont DM diet, excercise, wt loss efforts; to check labs today   Orders: TLB-Microalbumin/Creat Ratio, Urine (82043-MALB) TLB-A1C / Hgb A1C (Glycohemoglobin) (83036-A1C)  Problem # 3:  HYPERTENSION (ICD-401.9)  Her updated medication list for this problem includes:    Benicar Hct 40-25 Mg Tabs (Olmesartan medoxomil-hctz) .Marland Kitchen... 1po once daily  BP today: 126/82 Prior BP: 140/82 (04/02/2009)  Labs Reviewed: K+: 4.2 (03/31/2009) Creat: : 0.7 (03/31/2009)   Chol: 192 (03/31/2009)   HDL: 56.80 (03/31/2009)   LDL: 121 (03/31/2009)   TG: 69.0 (03/31/2009) stable overall by hx and exam, ok to continue meds/tx as is   Problem # 4:  ANXIETY,  SITUATIONAL (ICD-308.3) pt declines meds or counseling;  will Ok letter to state she should not be required to attend the classes for medical reason  Complete Medication List: 1)  Pantoprazole Sodium 40 Mg Tbec (Pantoprazole sodium) .Marland Kitchen.. 1po once daily 2)  Actos 45 Mg Tabs (Pioglitazone hcl) .Marland Kitchen.. 1 by mouth once daily 3)  Pravachol 20 Mg Tabs (Pravastatin sodium) .Marland Kitchen.. 1 by mouth once daily 4)  Benicar Hct 40-25 Mg Tabs (Olmesartan medoxomil-hctz) .Marland Kitchen.. 1po once daily 5)  Adult Aspirin Ec Low Strength 81 Mg Tbec (Aspirin) .Marland Kitchen.. 1 by mouth qd 6)  Cetirizine Hcl 10 Mg Tabs (Cetirizine hcl) .Marland Kitchen.. 1 by mouth once daily 7)  Klor-con 10 10 Meq Cr-tabs (Potassium chloride) .Marland Kitchen.. 1 by mouth once daily 8)  Glimepiride 1 Mg Tabs (Glimepiride) .Marland Kitchen.. 1 by mouth once daily  Patient Instructions: 1)  you had the pneumonia shot today 2)  please call for the screening colonoscopy if you change your mind 3)  Please call for your yearly mammogram -  consider [facility] on [address], or [facility] on [address] 4)  You are given the letter today 5)  Please schedule a follow-up appointment in 6 months with: 6)  BMP prior to visit, ICD-9: 250.02 7)  Lipid Panel prior to visit, ICD-9: 8)  HbgA1C prior to visit, ICD-9:  Appended Document: DISCUSS PAPERWORK /NWS also for right ankle film - will order  Appended Document: Immunization Entry      Immunizations Administered:  Pneumonia Vaccine:    Vaccine Type: Pneumovax    Site: left deltoid    Mfr: Merck    Dose: 0.5 ml    Route: IM    Given by: Zella Ball Ewing CMA (AAMA)    Exp. Date: 01/31/2011    Lot #: [id number]    VIS given: 08/30/95 version given September 02, 2009.

## 2010-03-05 NOTE — Letter (Signed)
Summary: Generic Letter  [facility]  [address] [place], [place] [address]   Phone: [phone]  Fax: [phone]    09/02/2009  [facility] [address] [place], [place]  [address]  Dear Ms. Oquin,       Please excuse Ms Lininger from the requirement of  classes as a condition of her continued employment as  this will lead to adverse and negative  effect on her  health.         Sincerely,   Oliver Barre MD

## 2010-03-05 NOTE — Progress Notes (Signed)
Summary: Flu Shot  Phone Note From Pharmacy   Caller: [facility] [address] [place] [address] Initial call taken by: Margaret Pyle, CMA,  March 12, 2009 1:37 PM      Immunization History:  Influenza Immunization History:    Influenza:  historical (03/12/2009)

## 2010-03-10 ENCOUNTER — Ambulatory Visit: Payer: Self-pay | Admitting: Internal Medicine

## 2010-03-16 ENCOUNTER — Telehealth: Payer: Self-pay | Admitting: Internal Medicine

## 2010-03-17 ENCOUNTER — Ambulatory Visit: Payer: Self-pay | Admitting: Internal Medicine

## 2010-03-24 ENCOUNTER — Telehealth (INDEPENDENT_AMBULATORY_CARE_PROVIDER_SITE_OTHER): Payer: Self-pay | Admitting: *Deleted

## 2010-03-25 ENCOUNTER — Encounter: Payer: Self-pay | Admitting: Internal Medicine

## 2010-03-25 NOTE — Progress Notes (Signed)
  Phone Note Call from Patient Call back at Home Phone [phone]   Caller: [phone] Call For: Dr Jonny Ruiz Summary of Call: Pt states her Benicar, needs prior-auth from our office for [employer].---[phone] Initial call taken by: Verdell Face,  March 16, 2010 8:59 AM  Follow-up for Phone Call        please ask if pt can ask at the pharmacy if a change to diovan, micadis or losartan is ok? Follow-up by: Corwin Levins MD,  March 16, 2010 10:10 AM  Additional Follow-up for Phone Call Additional follow up Details #1::        called pt. left msg. to call back Additional Follow-up by: Robin Ewing CMA Duncan Dull),  March 16, 2010 1:18 PM    Additional Follow-up for Phone Call Additional follow up Details #2::    patient returned call this morniing, informed of MD's request, patient agreed to do so and will call back once she gets a response. Follow-up by: Zella Ball Ewing CMA Duncan Dull),  March 17, 2010 8:54 AM   Appended Document:  patient called back and did call the pharmacy, they need a prescription and the insur. company should send a prior authorization request form. The patient is requesting samples of BP med. until she can get a prescription. Called the patient left msg. to call back.  Appended Document:  ok for 2 -3 wks samples  Appended Document:  called pt left msg. to call back  Appended Document:  called pt. informed samples in the cabnet at the front desk. She informed her insur. will be faxing over a prior auth. form for her medication.

## 2010-03-31 NOTE — Medication Information (Signed)
Summary: Prior autho & approved for Benicar/[employer]  Prior autho & approved for Benicar/[employer]   Imported By: Sherian Rein 03/27/2010 08:31:17  _____________________________________________________________________  External Attachment:    Type:   Image     Comment:   External Document

## 2010-03-31 NOTE — Progress Notes (Signed)
Summary: PA-Benicar  Phone Note From Pharmacy   Summary of Call: PA-Benicar, faxed to [facility] @  [phone], awaiting approval. Dagoberto Reef  March 24, 2010 4:32 PM PA Approved 03/25/10 - 12/18/2012, pt aware.  Initial call taken by: Dagoberto Reef,  March 25, 2010 3:59 PM

## 2010-05-12 ENCOUNTER — Other Ambulatory Visit: Payer: Self-pay

## 2010-05-12 ENCOUNTER — Other Ambulatory Visit (INDEPENDENT_AMBULATORY_CARE_PROVIDER_SITE_OTHER): Payer: BLUE CROSS/BLUE SHIELD

## 2010-05-12 ENCOUNTER — Ambulatory Visit (INDEPENDENT_AMBULATORY_CARE_PROVIDER_SITE_OTHER): Payer: BLUE CROSS/BLUE SHIELD | Admitting: Internal Medicine

## 2010-05-12 ENCOUNTER — Encounter: Payer: Self-pay | Admitting: Internal Medicine

## 2010-05-12 VITALS — BP 112/82 | HR 81 | Temp 97.9°F | Ht 62.0 in | Wt 233.4 lb

## 2010-05-12 DIAGNOSIS — Z0000 Encounter for general adult medical examination without abnormal findings: Secondary | ICD-10-CM

## 2010-05-12 DIAGNOSIS — Z01810 Encounter for preprocedural cardiovascular examination: Secondary | ICD-10-CM | POA: Insufficient documentation

## 2010-05-12 DIAGNOSIS — M25579 Pain in unspecified ankle and joints of unspecified foot: Secondary | ICD-10-CM

## 2010-05-12 DIAGNOSIS — M79609 Pain in unspecified limb: Secondary | ICD-10-CM

## 2010-05-12 DIAGNOSIS — E119 Type 2 diabetes mellitus without complications: Secondary | ICD-10-CM

## 2010-05-12 DIAGNOSIS — M79646 Pain in unspecified finger(s): Secondary | ICD-10-CM

## 2010-05-12 DIAGNOSIS — Z13220 Encounter for screening for lipoid disorders: Secondary | ICD-10-CM | POA: Insufficient documentation

## 2010-05-12 DIAGNOSIS — M25571 Pain in right ankle and joints of right foot: Secondary | ICD-10-CM

## 2010-05-12 DIAGNOSIS — I10 Essential (primary) hypertension: Secondary | ICD-10-CM

## 2010-05-12 LAB — LIPID PANEL
LDL Cholesterol: 118 mg/dL — ABNORMAL HIGH (ref 0–99)
Total CHOL/HDL Ratio: 4

## 2010-05-12 LAB — BASIC METABOLIC PANEL
BUN: 10 mg/dL (ref 6–23)
Chloride: 102 mEq/L (ref 96–112)
GFR: 103.7 mL/min (ref >60.00)
Potassium: 4 mEq/L (ref 3.5–5.1)
Sodium: 142 mEq/L (ref 135–145)

## 2010-05-12 LAB — HEMOGLOBIN A1C: Hgb A1c MFr Bld: 7.2 % — ABNORMAL HIGH (ref 4.6–6.5)

## 2010-05-12 MED ORDER — OLMESARTAN MEDOXOMIL-HCTZ 40-25 MG PO TABS: 1.0000 | ORAL_TABLET | Freq: Every day | ORAL | Status: DC

## 2010-05-12 MED ORDER — GLIMEPIRIDE 1 MG PO TABS: 1.0000 mg | ORAL_TABLET | Freq: Every day | ORAL | Status: DC

## 2010-05-12 MED ORDER — POTASSIUM CHLORIDE 10 MEQ PO TBCR: 10.0000 meq | EXTENDED_RELEASE_TABLET | Freq: Every day | ORAL | Status: DC

## 2010-05-12 MED ORDER — PANTOPRAZOLE SODIUM 40 MG PO TBEC: 40.0000 mg | DELAYED_RELEASE_TABLET | Freq: Every day | ORAL | Status: DC

## 2010-05-12 MED ORDER — CETIRIZINE HCL 10 MG PO TABS: 10.0000 mg | ORAL_TABLET | Freq: Every day | ORAL | Status: DC

## 2010-05-12 NOTE — Assessment & Plan Note (Signed)
stable overall by hx and exam, most recent lab reviewed with pt, and pt to continue medical treatment as before , to check labs today,  to f/u any worsening symptoms or concerns  Lab Results  Component Value Date   HGBA1C 7.2* 05/12/2010

## 2010-05-12 NOTE — Assessment & Plan Note (Signed)
stable overall by hx and exam, most recent lab reviewed with pt, and pt to continue medical treatment as before   BP Readings from Last 3 Encounters:  05/12/10 112/82  09/02/09 126/82  04/02/09 140/82

## 2010-05-12 NOTE — Assessment & Plan Note (Signed)
Improved, with evidence for tendonitis, pt educated and reassured, has minor flexion abnormality and no pain so pt declines hand surgury referral,  to f/u any worsening symptoms or concerns

## 2010-05-12 NOTE — Patient Instructions (Addendum)
You will be contacted regarding the referral for: orthopedic, Dr Lestine Box - [facility] [facility] Ok to stop the actos as you have Continue all other medications as before Please go to LAB in the Basement for the blood and/or urine tests to be done today Please call the number on the Blue Card (the [employer] System) for results of testing in 2-3 days Please call for referral if you need for the right thumb if gets worse Please return in 6 mo with Lab testing done 3-5 days before

## 2010-05-12 NOTE — Assessment & Plan Note (Addendum)
Mild to mod but persistent pain And swelling to medial ankle and foot -= ? tendinopathy - for referral to ortho/dr bednarz, Continue all other medications as before

## 2010-05-12 NOTE — Progress Notes (Signed)
Subjective:    Patient ID: Alice Hickman, female    DOB: Mar 30, 1950, 60 y.o.   MRN: [id number]  HPI  Here to f/u;  Overall doing ok,  Did lose 10 lbs since last visit with better diet;  Pt denies chest pain, increased sob or doe, wheezing, orthopnea, PND, increased LE swelling, palpitations, dizziness or syncope  Pt denies new neurological symptoms such as new headache, or facial or extremity weakness or numbness   Pt denies polydipsia, polyuria, or low sugar symptoms such as weakness or confusion improved with po intake.  Pt states overall good compliance with meds she is currently taking, trying to follow lower cholesterol, diabetic diet, wt overall stable but little exercise however.     CBG's in the lower 100's. Has ongoign right ankle and medial foot pain, just not improved from last visit, with some puffuiness, mild persistent, worse to stand, better to sit, limits her exercise as well.  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    Overall good compliance with treatment, and good medicine tolerability  Denies worsening depressive symptoms, suicidal ideation, or panic, though has ongoing anxiety, not increased recently.   Did stop the actos after she saw ads on TV with concerns about bladder cancer.  Stopped her pravastatin as well due to leg aching a few months ago.   Past Medical History  Diagnosis Date  . DM w/o Complication Type II 57/32/2025  . HYPERLIPIDEMIA 05/01/2007  . HYPOKALEMIA 10/02/2008  . ANXIETY, SITUATIONAL 09/02/2009  . CARPAL TUNNEL SYNDROME, RIGHT 11/15/2006  . GLAUCOMA 07/04/2008  . HYPERTENSION 11/15/2006  . ALLERGIC RHINITIS 05/01/2007  . GERD 11/15/2006  . ANKLE PAIN, RIGHT 09/02/2009  . THUMB PAIN, RIGHT 04/02/2009  . OSTEOPENIA 09/02/2009   Past Surgical History    Procedure Date  . Tonsillectomy   . Right shoulder sugury     reports that she has never smoked. She does not have any smokeless tobacco history on file. She reports that she does not drink alcohol or use illicit drugs. family history includes Cancer in her brother and other; Heart disease in her other; and Hypertension in her other. Allergies  Allergen Reactions  . Lovastatin     REACTION: leg cramps  . Metformin     REACTION: GI upset  . Penicillins     REACTION: erythema multiforme 2001   Current Outpatient Prescriptions on File Prior to Visit  Medication Sig Dispense Refill  . aspirin 81 MG tablet Take 81 mg by mouth daily.        Marland Kitchen DISCONTD: cetirizine (ZYRTEC) 10 MG tablet Take 10 mg by mouth daily.        Marland Kitchen DISCONTD: glimepiride (AMARYL) 1 MG tablet Take 1 mg by mouth daily.        Marland Kitchen DISCONTD: olmesartan-hydrochlorothiazide (BENICAR HCT) 40-25 MG per tablet Take 1 tablet by mouth daily.        Marland Kitchen DISCONTD: pantoprazole (PROTONIX) 40 MG tablet Take 40 mg by mouth daily.        Marland Kitchen DISCONTD: potassium chloride (KLOR-CON) 10 MEQ CR tablet Take 10 mEq by mouth daily.        Marland Kitchen DISCONTD: pioglitazone (ACTOS) 45 MG tablet Take 45 mg by  mouth daily.        Marland Kitchen DISCONTD: pravastatin (PRAVACHOL) 20 MG tablet Take 20 mg by mouth daily.          Review of Systems All otherwise neg per pt     Objective:   Physical ExamBP 112/82  Pulse 81  Temp(Src) 97.9 F (36.6 C) (Oral)  Ht 5\' 2"  (1.575 m)  Wt 233 lb 6 oz (105.858 kg)  BMI 42.68 kg/m2  SpO2 99% Physical Exam  VS noted Constitutional: Pt appears well-developed and well-nourished.  HENT: Head: Normocephalic.  Right Ear: External ear normal.  Left Ear: External ear normal.  Eyes: Conjunctivae and EOM are normal. Pupils are equal, round, and reactive to light.  Neck: Normal range of motion. Neck supple.  Cardiovascular: Normal rate and regular rhythm.   Pulmonary/Chest: Effort normal and breath sounds normal.  Abd:  Soft, NT,  non-distended, + BS Neurological: Pt is alert. No cranial nerve deficit.  Skin: Skin is warm. No erythema.  Psychiatric: Pt behavior is normal. Thought content normal.  MSK:  Right medial ankle with tender over tarsal tunnel area with mild puffiness, o/w neurovasc intact, no ulcers or erythema Right thumb with slight decreased full flexion, and palmar tendon mass approx 10 mm near base of the thumb, mobile, firm, nontender        Assessment & Plan:

## 2010-05-13 ENCOUNTER — Other Ambulatory Visit: Payer: Self-pay | Admitting: Internal Medicine

## 2010-05-13 MED ORDER — GLIMEPIRIDE 1 MG PO TABS: ORAL_TABLET | ORAL | Status: DC

## 2010-05-13 NOTE — Telephone Encounter (Signed)
glimeparide increased

## 2010-06-01 ENCOUNTER — Other Ambulatory Visit: Payer: Self-pay

## 2010-06-01 MED ORDER — OLMESARTAN MEDOXOMIL-HCTZ 40-25 MG PO TABS: 1.0000 | ORAL_TABLET | Freq: Every day | ORAL | Status: DC

## 2010-06-01 MED ORDER — CETIRIZINE HCL 10 MG PO TABS: 10.0000 mg | ORAL_TABLET | Freq: Every day | ORAL | Status: DC

## 2010-06-01 MED ORDER — POTASSIUM CHLORIDE 10 MEQ PO TBCR: 10.0000 meq | EXTENDED_RELEASE_TABLET | Freq: Every day | ORAL | Status: DC

## 2010-06-01 MED ORDER — GLIMEPIRIDE 1 MG PO TABS: ORAL_TABLET | ORAL | Status: DC

## 2010-06-01 MED ORDER — PANTOPRAZOLE SODIUM 40 MG PO TBEC: 40.0000 mg | DELAYED_RELEASE_TABLET | Freq: Every day | ORAL | Status: DC

## 2010-06-10 ENCOUNTER — Telehealth: Payer: Self-pay

## 2010-06-10 NOTE — Telephone Encounter (Signed)
Patient requesting updated letter to be completed for her employer stating the patient is unable to attend school due to medical reasons. Patient is starting PT for her legs and feet as Dr. Jonny Ruiz referred her for. Dr. Jonny Ruiz did ok letter for the patient. Completed letter and called patient to inform pickup at front desk.

## 2010-06-19 NOTE — H&P (Signed)
[place]. [facility]  Patient:    Alice Hickman, Alice Hickman                      MRN: [id number] Adm. Date:  [id number] Attending:  Rosezetta Schlatter                         History and Physical  CHIEF COMPLAINT:  Recurrent erythema multiforme with bullae.  HISTORY OF PRESENT ILLNESS:  Alice Hickman is a 60 year old black female recently hospitalized for erythema multiforme May 30 to July 03, 1999.  She was readmitted June 10 to July 14, 1999, for steroid-induced gastritis.  The patient presents to the ER today for recurrent rash now with fluid-filled bullae on her arms.  She reports the rash is pruritic and painful.  She has had no respiratory distress.  She denies any other systemic symptoms. She has had a prior PENICILLIN reaction.  She has no known other allergies.  She has had no prior skin diseases.  PAST MEDICAL HISTORY, FAMILY HISTORY, AND SOCIAL HISTORY:  Are documented in her recent two admissions.  PHYSICAL EXAMINATION AT ADMISSION:  VITAL SIGNS: Temperature 97.6, blood pressure 155/85, heart rate 95, respirations 20.  GENERAL:  A pleasant, obese, black female in no acute distress.  HEENT:  Normocephalic, atraumatic.  Conjunctivae and sclerae clear. Oropharynx without lesion.  NECK:  Supple.  There was no thyromegaly, no adenopathy noted in cervical or supraclavicular regions.  CHEST:  Clear to auscultation and percussion with no rales, wheezes, or rhonchi.  CARDIOVASCULAR:  Radial pulses 2+.  Quiet precordium.  Regular rate and rhythm without murmurs.  ABDOMEN:  Obese, soft, with positive bowel sounds.  GENITALIA/RECTAL:  Exams deferred.  EXTREMITIES:  Without clubbing, cyanosis, edema, or deformity.  DERMATOLOGIC:  The patient has diffuse macular rash on her back, arms, and abdomen with lesions that are generally small.  She has in addition larger macular lesions with central clearing, mostly on her arms an back.  The patient has  multiple fluid-filled bullae on her arms with one that has been ruptured with some grayish fibrin now over the lesion.  There is no erythema about that lesion.  No erythema or tenderness to suggest cellulitis.  LABORATORY DATA:  I-STAT revealed a sodium of 142, potassium 3.6, chloride 105, BUN 7, creatinine 0.6, glucose 132.  CBC showed a hemoglobin of 11.7, hematocrit 35.8, white count 9800 with 29% segs, 58% lymphs, 4% monos, 6% eosinophils.  ASSESSMENT/PLAN:  Dermatologic.  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 The patient was intolerant of all steroids in the past with significant gastritis.  PLAN:  The patient is admitted for IV steroids starting with Solu-Medrol 80 mg t.i.d. x 3 doses, then 80 mg b.i.d.  The patient will need a dermatology consult.  Will have nursing staff apply nonadherent dressing to the patients ruptured bullae.  The patient will be continued on a proton pump inhibitor. She will be continued on hydrochlorothiazide 25 mg q.d., Darvocet-N 100 for pain.  Will also obtain routine chemistries given the patients history of hyponatremia and hyperglycemia.  Will also include hemoglobin A1C in that lab. DD:  07/19/99 TD:  07/19/99 Job: [id number]

## 2010-06-19 NOTE — H&P (Signed)
[place]. [facility]  Patient:    CLAIRISSA, VALVANO                      MRN: [id number] Adm. Date:  [id number] Disc. Date: [id number] Attending:  Rosezetta Schlatter CC:         Biagio Borg, M.D. [facility]                         History and Physical  DATE OF BIRTH:  March 16, 1950  CHIEF COMPLAINT:  Cannot keep anything down.  HISTORY OF PRESENT ILLNESS:  The patient is a 60 year old female who started to get sick to her stomach about three days ago.  She was supposed to take two prednisone a day and has not been able to take any for three days.  She woke up this morning with nausea.  Her throat was burning and she had abdominal discomfort.  She threw up and after that started to spit up foam.  She has been unable to keep anything down and presented to the emergency room.  MEDICINES: 1. Prednisone taper (not taken for three days). 2. Diflucan, finished. 3. HCTZ 25 mg daily. 4. Darvocet p.r.n.  ALLERGIES:  PENICILLIN.  Her erythema may be related to [facility], which is an over the counter body detoxification product.  She took the last dose on May 18.  She stated that it was burning her throat all the time when she was taking it.  PAST MEDICAL HISTORY:  Erythema multiforme, hospitalized Jul 01, 1999 through July 03, 1999.  Oral Candidiasis, treated.  Hypertension.  SOCIAL HISTORY:  She has three kids.  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  FAMILY HISTORY:  No severe allergic reactions in the family.  REVIEW OF SYSTEMS:  Some indigestion over the past 2-3 days.  She denies chest pain.  There was no abdominal pain prior, no syncope, no hives.  The rest is negative or as above.  PHYSICAL EXAMINATION:  VITAL SIGNS:  Blood pressure 132/88, pulse 119, respirations 24, temperature 98.3, O2 saturation 98% on room air.  GENERAL:  She is in mild acute distress, appears uncomfortable.  She is holding a tray and spitting foamy saliva all the  time.  HEENT:  Reveals slightly dry oral mucosa.  She has erythema in the back of her throat, extending up to posterior part of the hard palate and she does have erythema of the inner lower gum.  There are no ulcers or erosions in the mouth.  She denies mouth pain.  NECK:  Supple, no pain to palpation.  CHEST:  Chest wall nontender.  LUNGS:  Clear, no wheezes.  HEART:  With S1, S2, tachycardic.  ABDOMEN:  _________ sensitive in the epigastric area, no organomegaly, no mass felt.  No rebound symptoms.  SKIN:  With healing papules (no fresh lesions).  Lower extremities without edema.  NEUROLOGIC:  She is alert and cooperative.  Cranial nerves 2-12 normal. _________ strength normal.  LABORATORY:  White count 14.5, hemoglobin 15.2, platelets 318.  ASSESSMENT/PLAN: 1. Nausea and vomiting due to possible gastritis.  Possibly steroid induced.    Will use IV Protonix, IV Phenergan.  May need a GI consult on June 11 if    not feeling better. 2. Dehydration due to problem #1.  Will treat with IV fluids. 3. Elevated white count due to steroids, most likely.  Will repeat next  erythema multiforme, probably improving overall.  The patient denies    mouth/throat pain, no itching, no new skin lesions.  Will treat with IV    steroids to cover for stress.  _________ 10 mg daily with Benadryl IV. DD:  07/12/99 TD:  07/12/99 Job: [id number]

## 2010-06-19 NOTE — Discharge Summary (Signed)
[place]. [facility]  Patient:    Alice Hickman, Alice Hickman                      MRN: [id number] Adm. Date:  [id number] Disc. Date: [id number] Attending:  Rosezetta Schlatter Dictator:   Helayne Seminole, P.A. CC:         Dr. Jenny Reichmann                           Discharge Summary  DISCHARGE DIAGNOSES: 1. Erythema multiforme minor. 2. Candida. 3. Penicillin allergy.  RECENT ADMISSION HISTORY:  Alice Hickman is a 60 year old African-American female who was seen in the [facility] Emergency Room on Friday, May 25.  She had canker sores that were inflamed.  She was apparently given some Keflex to be taken four times a day.  Her symptoms worsened with spread of that rash leading to a revisit on Sunday, May 27.  At that time she was diagnosed with "fungus" and given IV ampicillin and sent home on amoxicillin.  The patient then presented to [facility] Emergency Department on Monday, May 28 with oral blisters.  She was seen by Dr. Kenton Kingfisher and Radford Pax and diagnosed with Stevens-Johnson syndrome.  She was started on Magic Mouthwash, Zithromax and prednisone.  The patient was discharged home but was unable to take the prednisone secondary to vomiting and mouth pain.  Therefore, she presented back to the [facility] Emergency Room unable to swallow.  The patient was admitted for IV fluids and supportive care.  PAST MEDICAL HISTORY: 1. History of measles and mumps. 2. History of gestational diabetes. 3. Status post tonsillectomy. 4. Bilateral tubal ligation. 5. Fractured clavicle and right foot.  LABORATORY DATA ON ADMISSION:  White count was 11.7 with 67% neutrophils and 3% eosinophils which is normal.  Potassium is 3.3, glucose 136.  Blood cultures were negative.  HOSPITAL COURSE:  Problem 1. Dermatology.  Patient presented with worsening erythema multiforme.  The patient was unable to take the steroids at home. She was admitted to the hospital and Dr. Radford Pax was again, asked to see  the patient.  He recommended IV Solu-Medrol.  The patient was empirically started on IV Levaquin as well secondary to a fever with a T-max of 103 and a mild leukocytosis.  As noted, the blood cultures were negative.  The patients lesions did not progress and she began to feel better.  She did appear to have evidence of Candida orally and was complaining of some dysuria, therefore, we did empirically start her on Diflucan as well.  Dr. Heron Nay made recommendations for steroid taper.  As the patients fever resolved and her white count normalized, antibiotics were discontinued as we did not have any clear source of infection.  Problem 2. Hypertension.  The patient had consistently elevated systolic blood pressure between 170 and 180.  She has been told in the past that she has high blood pressure, but has never been on any antihypertensive.  We did start the patient on hydrochlorothiazide.  DISCHARGE MEDICATIONS: 1. Prednisone taper 20 mg t.i.d. for seven days, then 20 mg b.i.d. for two    days, then 20 mg 1-1/2 tabs q.d. for two days, then 1 tab for two days,    then stop. 2. Diflucan 150 mg daily for eight days. 3. Hydrochlorothiazide 25 mg q.d. 4. Darvocet-N 100 q.4-6h. p.r.n.  FOLLOWUP:  Follow up with Dr. Jenny Reichmann, June 5 at 9:15 a.m. DD:  07/03/99 TD:  07/07/99 Job: [id number]

## 2010-06-19 NOTE — Discharge Summary (Signed)
[place]. [facility]  Patient:    Alice Hickman, Alice Hickman                      MRN: [id number] Adm. Date:  [id number] Disc. Date: [id number] Attending:  Rosezetta Schlatter Dictator:   Helayne Seminole, P.A. CC:         Princess Bruins. Lynnea Maizes, M.D.             Walker Kehr, M.D. [facility]                           Discharge Summary  DISCHARGE DIAGNOSES: 1. Recurrent erythema multiforme. 2. Glucose intolerance. 3. Hypertension. 4. Dysuria. 5. Steroid-induced gastritis.  HISTORY OF PRESENT ILLNESS:  [name] is a 60 year old African-American female.  He was recently hospitalized with erythema multiforme May 30 through July 03, 1999.  This was secondary to a penicillin reaction.  She was readmitted June 10 through July 14, 1999, for a steroid-induced gastritis. She presented to the ER on the day of admission with recurrent rash.  HOSPITAL COURSE: #1 - DERMATOLOGY:  The patient presented with recurrent erythema multiforme originally secondary to a penicillin reaction.  Dr. Radford Pax saw the patient during her initial hospitalization and we did ask him to evaluate her again. He did note that the reaction could continue for up to six to eight weeks and that it would be important for her to be able to continue the steroids.  At this time, he recommended 60 mg of prednisone in three divided doses daily until he sees her next week.  He also recommended triamcinolone cream topically up to three times a day to prevent discoloration of the rash.  #2 - HYPERGLYCEMIA/GLUCOSE INTOLERANCE:  Likely steroid induced.  Patient has been given instructions for a diabetic diet while on prednisone.  #3 - GASTRITIS:  This has been better since she has been on Protonix and will have her continue the Protonix.  FOLLOW-UP:  The patient is to follow up with Dr. Radford Pax and her primary physician in seven days. DD:  07/22/99 TD:  07/23/99 Job: [id number]

## 2010-06-19 NOTE — Discharge Summary (Signed)
[place]. [facility]  Patient:    Alice Hickman, Alice Hickman                      MRN: [id number] Adm. Date:  [id number] Disc. Date: [id number] Attending:  Cassandria Anger Dictator:   Helayne Seminole, P.A. CC:         Biagio Borg, M.D. [facility]                           Discharge Summary  DISCHARGE DIAGNOSES: 1. Intractable nausea and vomiting. 2. Abdominal pain.  HISTORY OF PRESENT ILLNESS:  Alice Hickman is a 60 year old African-American female who presented after three days of nausea and vomiting.  The patient has been on a steroid taper for two weeks secondary to erythema multiforme minor. The patient presented to the ER as she was not able to keep anything down.  PAST MEDICAL HISTORY: 1. Erythema multiforme secondary to penicillin allergy, hospitalized Jul 01, 1999, through July 03, 1999. 2. Recent oral candidiasis, treated. 3. Hypertension. 4. History of gestational diabetes.  ADMISSION LABORATORY DATA:  White count 14.5, hemoglobin 15.2, platelets 318.  HOSPITAL COURSE: #1 - GASTROINTESTINAL:  The patient presented with intractable nausea and vomiting likely secondary to gastritis, possibly steroid-induced.  The patient was started on Protonix and Phenergan as well as IV fluids.  The patient did have some mild dehydration.  The patient was rehydrated and, with treatment, was able to tolerate a diet.  At discharge, she is tolerating a regular diet without any nausea or vomiting.  #2 - HYPERGLYCEMIA:  The patients blood sugars were in the 200s.  She does have a history of gestational diabetes, but has been on a two-week course of steroids, and this may be the etiology of her hyperglycemia.  We did check a hemoglobin A1C; however, this is still pending.  At discharge, her blood sugars were between 120 and 130.  We will let Dr. Jenny Reichmann follow up this in the office.  #3 - LEUKOCYTOSIS WITHOUT FEVER:  Again, this is felt to be a steroid effect as no evidence of  infection was identified.  #4 - HYPONATREMIA:  The patients sodium levels were low on admission.  This may be secondary to her gastritis and/or her hydrochlorothiazide.  At discharge, her sodium was 134.  DISCHARGE LABORATORY DATA:  Amylase and lipase were normal.  Sodium was 134, BUN was 19, creatinine was 0.8.  Sedimentation rate was 19.  DISCHARGE MEDICATIONS: 1. Hydrochlorothiazide 25 mg q.d. 2. Darvocet p.r.n. 3. Protonix 40 mg q.d.  FOLLOW-UP:  The patient is to follow up with Dr. Jenny Reichmann in one to two weeks. DD:  07/14/99 TD:  07/16/99 Job: [id number]

## 2010-08-17 ENCOUNTER — Ambulatory Visit: Payer: BLUE CROSS/BLUE SHIELD | Admitting: *Deleted

## 2010-08-17 DIAGNOSIS — Z111 Encounter for screening for respiratory tuberculosis: Secondary | ICD-10-CM

## 2010-11-11 ENCOUNTER — Ambulatory Visit: Payer: BLUE CROSS/BLUE SHIELD | Admitting: Internal Medicine

## 2011-01-25 ENCOUNTER — Emergency Department (HOSPITAL_COMMUNITY)
Admission: EM | Admit: 2011-01-25 | Discharge: 2011-01-25 | Disposition: A | Discharge status: Home / Self Care | Payer: Self-pay | Attending: Emergency Medicine | Admitting: Emergency Medicine

## 2011-01-25 ENCOUNTER — Encounter (HOSPITAL_COMMUNITY): Payer: Self-pay | Admitting: Emergency Medicine

## 2011-01-25 DIAGNOSIS — S335XXA Sprain of ligaments of lumbar spine, initial encounter: Secondary | ICD-10-CM | POA: Insufficient documentation

## 2011-01-25 DIAGNOSIS — S39012A Strain of muscle, fascia and tendon of lower back, initial encounter: Secondary | ICD-10-CM

## 2011-01-25 DIAGNOSIS — E785 Hyperlipidemia, unspecified: Secondary | ICD-10-CM | POA: Insufficient documentation

## 2011-01-25 DIAGNOSIS — M545 Low back pain, unspecified: Secondary | ICD-10-CM | POA: Insufficient documentation

## 2011-01-25 DIAGNOSIS — Z7982 Long term (current) use of aspirin: Secondary | ICD-10-CM | POA: Insufficient documentation

## 2011-01-25 DIAGNOSIS — I10 Essential (primary) hypertension: Secondary | ICD-10-CM | POA: Insufficient documentation

## 2011-01-25 DIAGNOSIS — E119 Type 2 diabetes mellitus without complications: Secondary | ICD-10-CM | POA: Insufficient documentation

## 2011-01-25 DIAGNOSIS — X503XXA Overexertion from repetitive movements, initial encounter: Secondary | ICD-10-CM | POA: Insufficient documentation

## 2011-01-25 DIAGNOSIS — Z79899 Other long term (current) drug therapy: Secondary | ICD-10-CM | POA: Insufficient documentation

## 2011-01-25 DIAGNOSIS — H409 Unspecified glaucoma: Secondary | ICD-10-CM | POA: Insufficient documentation

## 2011-01-25 DIAGNOSIS — K219 Gastro-esophageal reflux disease without esophagitis: Secondary | ICD-10-CM | POA: Insufficient documentation

## 2011-01-25 HISTORY — DX: Low back pain: M54.5

## 2011-01-25 HISTORY — DX: Low back pain, unspecified: M54.50

## 2011-01-25 MED ORDER — HYDROCODONE-ACETAMINOPHEN 5-325 MG PO TABS
1.0000 | ORAL_TABLET | Freq: Once | ORAL | Status: AC
  Administered 2011-01-25: 1 via ORAL
  Filled 2011-01-25 (×2): qty 1

## 2011-01-25 MED ORDER — METHOCARBAMOL 500 MG PO TABS: 1000.0000 mg | ORAL_TABLET | Freq: Four times a day (QID) | ORAL | Status: AC | PRN

## 2011-01-25 MED ORDER — DIAZEPAM 5 MG PO TABS
5.0000 mg | ORAL_TABLET | Freq: Once | ORAL | Status: AC
  Administered 2011-01-25: 5 mg via ORAL
  Filled 2011-01-25: qty 1

## 2011-01-25 MED ORDER — HYDROCODONE-ACETAMINOPHEN 5-325 MG PO TABS: ORAL_TABLET | ORAL | Status: DC

## 2011-01-25 NOTE — ED Notes (Signed)
Dr Clarene Duke at bedside to see pt.

## 2011-01-25 NOTE — ED Notes (Signed)
Pt c/o right sided low back pain. Pt states been doing some heavy lifting and moving. Pt denies n/t to legs. Pt denies n/v. Pt denies any pain with urination or urinary frequency. Pt ambulatory steady gait noted.

## 2011-01-25 NOTE — ED Provider Notes (Signed)
History     CSN: [id number]  Arrival date & time 01/25/11  0134  Chief Complaint  Patient presents with  . Back Pain    pt states been having right flank pain since yesterday. pt states has been doing some heavy lifting and moving. pt denies n/t to legs.    HPI Pt was seen at 0240.  Per pt, c/o gradual onset and persistence of constant right sided low back "pain" x2 days.  States her pain began after she was moving furniture.  Has previous hx of LBP.  Denies incont/retention of bowel or bladder, no saddle anesthesia, no focal motor weakness, no tingling/numbness in extremities, no fevers, no injury.   The symptoms have been associated with no other complaints.   Past Medical History  Diagnosis Date  . DM w/o Complication Type II 11/15/2006  . HYPERLIPIDEMIA 05/01/2007  . HYPOKALEMIA 10/02/2008  . ANXIETY, SITUATIONAL 09/02/2009  . CARPAL TUNNEL SYNDROME, RIGHT 11/15/2006  . GLAUCOMA 07/04/2008  . HYPERTENSION 11/15/2006  . ALLERGIC RHINITIS 05/01/2007  . GERD 11/15/2006  . ANKLE PAIN, RIGHT 09/02/2009  . THUMB PAIN, RIGHT 04/02/2009  . OSTEOPENIA 09/02/2009  . Low back pain     Past Surgical History  Procedure Date  . Tonsillectomy   . Right shoulder sugury     Family History  Problem Relation Age of Onset  . Cancer Brother     throat cancer, smoker  . Heart disease Other   . Hypertension Other   . Cancer Other     lung cancer    History  Substance Use Topics  . Smoking status: Never Smoker   . Smokeless tobacco: Not on file  . Alcohol Use: No    Review of Systems ROS: Statement: All systems negative except as marked or noted in the HPI; Constitutional: Negative for fever and chills. ; ; Eyes: Negative for eye pain, redness and discharge. ; ; ENMT: Negative for ear pain, hoarseness, nasal congestion, sinus pressure and sore throat. ; ; Cardiovascular: Negative for chest pain, palpitations, diaphoresis, dyspnea and peripheral edema. ; ; Respiratory: Negative for cough,  wheezing and stridor. ; ; Gastrointestinal: Negative for nausea, vomiting, diarrhea, abdominal pain, blood in stool, hematemesis, jaundice and rectal bleeding. . ; ; Genitourinary: Negative for dysuria, flank pain and hematuria. ; ; Musculoskeletal: +LBP.  Negative for neck pain. Negative for swelling and trauma.; ; Skin: Negative for pruritus, rash, abrasions, blisters, bruising and skin lesion.; ; Neuro: Negative for headache, lightheadedness and neck stiffness. Negative for weakness, altered level of consciousness , altered mental status, extremity weakness, paresthesias, involuntary movement, seizure and syncope.     Allergies  Lovastatin; Metformin; and Penicillins  Home Medications   Current Outpatient Rx  Name Route Sig Dispense Refill  . ASPIRIN 81 MG PO TABS Oral Take 81 mg by mouth daily.      Marland Kitchen GLIMEPIRIDE 1 MG PO TABS  Take 1 and 1/2 pills in the AM 135 tablet 3  . OLMESARTAN MEDOXOMIL-HCTZ 40-25 MG PO TABS Oral Take 1 tablet by mouth daily. 90 tablet 3  . PANTOPRAZOLE SODIUM 40 MG PO TBEC Oral Take 1 tablet (40 mg total) by mouth daily. 90 tablet 3    BP 158/98  Pulse 82  Temp(Src) 98.5 F (36.9 C) (Oral)  Resp 20  SpO2 99%  Physical Exam 0245: Physical examination:  Nursing notes reviewed; Vital signs and O2 SAT reviewed;  Constitutional: Well developed, Well nourished, Well hydrated, In no acute distress; Head:  Normocephalic,  atraumatic; Eyes: EOMI, PERRL, No scleral icterus; ENMT: Mouth and pharynx normal, Mucous membranes moist; Neck: Supple, Full range of motion, No lymphadenopathy; Cardiovascular: Regular rate and rhythm, No murmur, rub, or gallop; Respiratory: Breath sounds clear & equal bilaterally, No rales, rhonchi, wheezes, or rub, Normal respiratory effort/excursion; Chest: Nontender, Movement normal; Genitourinary: No CVA tenderness; Spine:  No midline CS, TS, LS tenderness. +TTP right lower lumbar paraspinal muscles which reproduces pt's symptoms. Extremities:  Pulses normal, No tenderness, No edema, No calf edema or asymmetry.; Neuro: AA&Ox3, Major CN grossly intact. Strength 5/5 equal bilat UE's and LE's, including great toe dorsiflexion.  DTR 2/4 equal bilat UE's and LE's.  No gross sensory deficits.  Neg straight leg raises bilat.  Gait steady..; Skin: Color normal, Warm, Dry, no rash.    ED Course  Procedures    MDM  MDM Reviewed: nursing note and vitals    Right sided LBP after moving furniture.  Endorses hx of same.  Has not taken any meds for pain.  Requesting dose here before discharge.    Romanda Turrubiates Allison Quarry, DO 01/27/11 [id number]

## 2011-01-31 ENCOUNTER — Encounter (HOSPITAL_COMMUNITY): Payer: Self-pay

## 2011-01-31 ENCOUNTER — Emergency Department (INDEPENDENT_AMBULATORY_CARE_PROVIDER_SITE_OTHER)
Admission: EM | Admit: 2011-01-31 | Discharge: 2011-01-31 | Disposition: A | Discharge status: Home / Self Care | Payer: Self-pay | Source: Home / Self Care

## 2011-01-31 DIAGNOSIS — B029 Zoster without complications: Secondary | ICD-10-CM

## 2011-01-31 MED ORDER — VALACYCLOVIR HCL 1 G PO TABS: 1000.0000 mg | ORAL_TABLET | Freq: Three times a day (TID) | ORAL | Status: AC

## 2011-01-31 NOTE — ED Notes (Signed)
Pt states prescribed hydrocodone Friday for pulled muscle and started breaking out with a red raised rash on back and abdomen. C/o itching.

## 2011-01-31 NOTE — ED Provider Notes (Signed)
Medical screening examination/treatment/procedure(s) were performed by non-physician practitioner and as supervising physician I was immediately available for consultation/collaboration.  Hillery Hunter, MD 01/31/11 [phone]

## 2011-01-31 NOTE — ED Provider Notes (Signed)
History     CSN: [id number]  Arrival date & time 01/31/11  0910   None     Chief Complaint  Patient presents with  . Allergic Reaction    rash appeared after taking hydrocodone friday night.    (Consider location/radiation/quality/duration/timing/severity/associated sxs/prior treatment) HPI Comments: Pt was seen in ED 01-25-11 with Rt lower back pain. She thought she had pulled a muscle and was prescribed Robaxin and Hydrocodone. The next day she began to notice a rash on her Rt lower back and yesterday noticed the rash coming around to her Rt abdomen. She put hydrocortisone cream on the rash yesterday without improvement. She is concerned that the rash is due to an allergic reaction to the Hydrocodone she is taking for pain.    Past Medical History  Diagnosis Date  . DM w/o Complication Type II 11/15/2006  . HYPERLIPIDEMIA 05/01/2007  . HYPOKALEMIA 10/02/2008  . ANXIETY, SITUATIONAL 09/02/2009  . CARPAL TUNNEL SYNDROME, RIGHT 11/15/2006  . GLAUCOMA 07/04/2008  . HYPERTENSION 11/15/2006  . ALLERGIC RHINITIS 05/01/2007  . GERD 11/15/2006  . ANKLE PAIN, RIGHT 09/02/2009  . THUMB PAIN, RIGHT 04/02/2009  . OSTEOPENIA 09/02/2009  . Low back pain     Past Surgical History  Procedure Date  . Tonsillectomy   . Right shoulder sugury     Family History  Problem Relation Age of Onset  . Cancer Brother     throat cancer, smoker  . Heart disease Other   . Hypertension Other   . Cancer Other     lung cancer    History  Substance Use Topics  . Smoking status: Never Smoker   . Smokeless tobacco: Not on file  . Alcohol Use: No    OB History    Grav Para Term Preterm Abortions TAB SAB Ect Mult Living                  Review of Systems  Constitutional: Negative for fever and chills.  Respiratory: Negative for cough and shortness of breath.   Cardiovascular: Negative for chest pain.  Gastrointestinal: Negative for nausea, vomiting and abdominal pain.  Musculoskeletal: Positive  for back pain.  Skin: Positive for rash.    Allergies  Lovastatin; Metformin; and Penicillins  Home Medications   Current Outpatient Rx  Name Route Sig Dispense Refill  . ASPIRIN 81 MG PO TABS Oral Take 81 mg by mouth daily.      Marland Kitchen GLIMEPIRIDE 1 MG PO TABS  Take 1 and 1/2 pills in the AM 135 tablet 3  . HYDROCODONE-ACETAMINOPHEN 5-325 MG PO TABS  1 or 2 tabs PO q4-6 hours prn pain 20 tablet 0  . METHOCARBAMOL 500 MG PO TABS Oral Take 2 tablets (1,000 mg total) by mouth 4 (four) times daily as needed. 15 tablet 0  . OLMESARTAN MEDOXOMIL-HCTZ 40-25 MG PO TABS Oral Take 1 tablet by mouth daily. 90 tablet 3  . PANTOPRAZOLE SODIUM 40 MG PO TBEC Oral Take 1 tablet (40 mg total) by mouth daily. 90 tablet 3  . VALACYCLOVIR HCL 1 G PO TABS Oral Take 1 tablet (1,000 mg total) by mouth 3 (three) times daily. 21 tablet 0    BP 187/94  Pulse 84  Temp(Src) 98.5 F (36.9 C) (Oral)  Resp 18  SpO2 100%  Physical Exam  Nursing note and vitals reviewed. Constitutional: She appears well-developed and well-nourished. No distress.  Cardiovascular: Normal rate, regular rhythm and normal heart sounds.   Pulmonary/Chest: Effort normal and breath  sounds normal. No respiratory distress.  Skin: Skin is warm and dry. Rash noted. Rash is papular and vesicular.     Psychiatric: She has a normal mood and affect.    ED Course  Procedures (including critical care time)  Labs Reviewed - No data to display No results found.   1. Herpes zoster       MDM  Typical zoster rash Rt waistline.  01-25-11 ED visit reviewed.        Melody Comas, [place] 01/31/11 [phone]

## 2011-02-03 ENCOUNTER — Other Ambulatory Visit (INDEPENDENT_AMBULATORY_CARE_PROVIDER_SITE_OTHER): Payer: BLUE CROSS/BLUE SHIELD

## 2011-02-03 ENCOUNTER — Ambulatory Visit (INDEPENDENT_AMBULATORY_CARE_PROVIDER_SITE_OTHER): Payer: BLUE CROSS/BLUE SHIELD | Admitting: Internal Medicine

## 2011-02-03 ENCOUNTER — Encounter: Payer: Self-pay | Admitting: Internal Medicine

## 2011-02-03 ENCOUNTER — Other Ambulatory Visit: Payer: Self-pay | Admitting: Internal Medicine

## 2011-02-03 VITALS — BP 122/80 | HR 75 | Temp 98.4°F | Ht 62.0 in | Wt 229.4 lb

## 2011-02-03 DIAGNOSIS — I10 Essential (primary) hypertension: Secondary | ICD-10-CM

## 2011-02-03 DIAGNOSIS — E119 Type 2 diabetes mellitus without complications: Secondary | ICD-10-CM

## 2011-02-03 DIAGNOSIS — B029 Zoster without complications: Secondary | ICD-10-CM | POA: Insufficient documentation

## 2011-02-03 DIAGNOSIS — Z0000 Encounter for general adult medical examination without abnormal findings: Secondary | ICD-10-CM

## 2011-02-03 LAB — BASIC METABOLIC PANEL
CO2: 34 mEq/L — ABNORMAL HIGH (ref 19–32)
Chloride: 102 mEq/L (ref 96–112)
Creatinine, Ser: 0.8 mg/dL (ref 0.4–1.2)
Glucose, Bld: 104 mg/dL — ABNORMAL HIGH (ref 70–99)

## 2011-02-03 LAB — LIPID PANEL
Cholesterol: 186 mg/dL (ref 0–200)
Total CHOL/HDL Ratio: 4
Triglycerides: 96 mg/dL (ref 0.0–149.0)

## 2011-02-03 MED ORDER — PRAVASTATIN SODIUM 20 MG PO TABS: 20.0000 mg | ORAL_TABLET | Freq: Every day | ORAL | Status: DC

## 2011-02-03 MED ORDER — LIDOCAINE 5 % EX PTCH: 1.0000 | MEDICATED_PATCH | CUTANEOUS | Status: AC

## 2011-02-03 MED ORDER — HYDROCODONE-ACETAMINOPHEN 5-325 MG PO TABS: 1.0000 | ORAL_TABLET | Freq: Four times a day (QID) | ORAL | Status: AC | PRN

## 2011-02-03 NOTE — Patient Instructions (Addendum)
Take all new medications as prescribed - the pain medication, and the patch Please finish antibiotic You are given the work note Your FMLA form was filled out Please go to LAB in the Basement for the blood and/or urine tests to be done today Please call the phone number [phone] (the [employer]Tree System) for results of testing in 2-3 days;  When calling, simply dial the number, and when prompted enter the MRN number above (the Medical Record Number) and the # key, then the message should start. Please return in 6 mo with Lab testing done 3-5 days before

## 2011-02-05 ENCOUNTER — Telehealth: Payer: Self-pay

## 2011-02-05 NOTE — Telephone Encounter (Signed)
Patient came by office to ask for extension on work note from 01/24/11 through 02/07/11 to go until 02/14/11. Informed JWJ and he stated he could not do so. Called and informed the patient of MD's response.

## 2011-02-07 ENCOUNTER — Encounter: Payer: Self-pay | Admitting: Internal Medicine

## 2011-02-07 NOTE — Assessment & Plan Note (Signed)
Improved overall except for pain;  To finish the valtrex asd, and add vicodin, lidoderm prn, work note given

## 2011-02-07 NOTE — Assessment & Plan Note (Signed)
stable overall by hx and exam, most recent data reviewed with pt, and pt to continue medical treatment as before  Lab Results  Component Value Date   HGBA1C 7.1* 02/03/2011

## 2011-02-07 NOTE — Progress Notes (Signed)
Subjective:    Patient ID: Alice Hickman, female    DOB: 1950-04-01, 61 y.o.   MRN: [id number]  HPI  Here to f/u after being seen in ER twice with back pain; tx first for MSK pain, later for shingles after rash presented, out of work for 2 days due to pain, only 3 more days left valtrex and tolerating well but pain still 8/10; overall missed work 4 days between dec 23 to date;  Beckie Busing helps somewhat but too much pain to work [employer] 3rd shift involving lifting, bending, twisting.  Pt denies chest pain, increased sob or doe, wheezing, orthopnea, PND, increased LE swelling, palpitations, dizziness or syncope.  Pt denies new neurological symptoms such as new headache, or facial or extremity weakness or numbness   Pt denies polydipsia, polyuria, or low sugar symptoms such as weakness or confusion improved with po intake.  Pt states overall good compliance with meds, trying to follow lower cholesterol, diabetic diet, wt overall stable but little exercise however.    Past Medical History  Diagnosis Date  . DM w/o Complication Type II 27/07/2374  . HYPERLIPIDEMIA 05/01/2007  . HYPOKALEMIA 10/02/2008  . ANXIETY, SITUATIONAL 09/02/2009  . CARPAL TUNNEL SYNDROME, RIGHT 11/15/2006  . GLAUCOMA 07/04/2008  . HYPERTENSION 11/15/2006  . ALLERGIC RHINITIS 05/01/2007  . GERD 11/15/2006  . ANKLE PAIN, RIGHT 09/02/2009  . THUMB PAIN, RIGHT 04/02/2009  . OSTEOPENIA 09/02/2009  . Low back pain    Past Surgical History  Procedure Date  . Tonsillectomy   . Right shoulder sugury     reports that she has never smoked. She does not have any smokeless tobacco history on file. She reports that she does not drink alcohol or use illicit drugs. family history includes Cancer in her brother and other; Heart disease in her other; and Hypertension in her other. Allergies  Allergen Reactions  . Lovastatin     REACTION: leg cramps  . Metformin     REACTION: GI upset  . Penicillins     REACTION: erythema multiforme  2001    Current Outpatient Prescriptions on File Prior to Visit  Medication Sig Dispense Refill  . aspirin 81 MG tablet Take 81 mg by mouth daily.        Marland Kitchen glimepiride (AMARYL) 1 MG tablet Take 1 and 1/2 pills in the AM  135 tablet  3  . olmesartan-hydrochlorothiazide (BENICAR HCT) 40-25 MG per tablet Take 1 tablet by mouth daily.  90 tablet  3  . pantoprazole (PROTONIX) 40 MG tablet Take 1 tablet (40 mg total) by mouth daily.  90 tablet  3  . valACYclovir (VALTREX) 1000 MG tablet Take 1 tablet (1,000 mg total) by mouth 3 (three) times daily.  21 tablet  0   Review of Systems Review of Systems  Constitutional: Negative for diaphoresis and unexpected weight change.  HENT: Negative for drooling and tinnitus.   Eyes: Negative for photophobia and visual disturbance.  Respiratory: Negative for choking and stridor.   Gastrointestinal: Negative for vomiting and blood in stool.  Genitourinary: Negative for hematuria and decreased urine volume.  Objective:   Physical Exam BP 122/80  Pulse 75  Temp(Src) 98.4 F (36.9 C) (Oral)  Ht 5\' 2"  (1.575 m)  Wt 229 lb 6 oz (104.044 kg)  BMI 41.95 kg/m2  SpO2 96% Physical Exam  VS noted Constitutional: Pt appears well-developed and well-nourished.  HENT: Head: Normocephalic.  Right Ear: External ear normal.  Left Ear: External ear normal.  Eyes:  Conjunctivae and EOM are normal. Pupils are equal, round, and reactive to light.  Neck: Normal range of motion. Neck supple.  Cardiovascular: Normal rate and regular rhythm.   Pulmonary/Chest: Effort normal and breath sounds normal.  Abd:  Soft, NT, non-distended, + BS Neurological: Pt is alert. No cranial nerve deficit.  Skin: Skin is warm. No erythema. except for typical rash left lumbar starting to crust Psychiatric: Pt behavior is normal. Thought content normal.     Assessment & Plan:

## 2011-02-07 NOTE — Assessment & Plan Note (Signed)
stable overall by hx and exam, most recent data reviewed with pt, and pt to continue medical treatment as before  BP Readings from Last 3 Encounters:  02/03/11 122/80  01/31/11 187/94  01/25/11 148/66

## 2011-08-06 ENCOUNTER — Ambulatory Visit: Payer: BLUE CROSS/BLUE SHIELD | Admitting: Internal Medicine

## 2012-03-06 ENCOUNTER — Telehealth: Payer: Self-pay

## 2012-03-06 DIAGNOSIS — Z0000 Encounter for general adult medical examination without abnormal findings: Secondary | ICD-10-CM

## 2012-03-06 NOTE — Telephone Encounter (Signed)
Labs ordered for CPX

## 2012-04-14 ENCOUNTER — Other Ambulatory Visit (INDEPENDENT_AMBULATORY_CARE_PROVIDER_SITE_OTHER): Payer: BLUE CROSS/BLUE SHIELD

## 2012-04-14 DIAGNOSIS — Z0000 Encounter for general adult medical examination without abnormal findings: Secondary | ICD-10-CM

## 2012-04-14 LAB — HEPATIC FUNCTION PANEL
ALT: 31 U/L (ref 0–35)
AST: 23 U/L (ref 0–37)
Bilirubin, Direct: 0.1 mg/dL (ref 0.0–0.3)
Total Bilirubin: 0.4 mg/dL (ref 0.3–1.2)
Total Protein: 7.6 g/dL (ref 6.0–8.3)

## 2012-04-14 LAB — CBC WITH DIFFERENTIAL/PLATELET
Basophils Absolute: 0 10*3/uL (ref 0.0–0.1)
Basophils Relative: 0.7 % (ref 0.0–3.0)
Eosinophils Absolute: 0.4 10*3/uL (ref 0.0–0.7)
Hemoglobin: 13.2 g/dL (ref 12.0–15.0)
Lymphocytes Relative: 45.6 % (ref 12.0–46.0)
MCHC: 32.3 g/dL (ref 30.0–36.0)
Monocytes Relative: 7 % (ref 3.0–12.0)
Neutrophils Relative %: 40.4 % — ABNORMAL LOW (ref 43.0–77.0)
RBC: 4.8 Mil/uL (ref 3.87–5.11)

## 2012-04-14 LAB — BASIC METABOLIC PANEL
BUN: 8 mg/dL (ref 6–23)
CO2: 28 mEq/L (ref 19–32)
Chloride: 104 mEq/L (ref 96–112)
Potassium: 3.8 mEq/L (ref 3.5–5.1)

## 2012-04-14 LAB — LIPID PANEL
Cholesterol: 193 mg/dL (ref 0–200)
HDL: 49.3 mg/dL (ref >39.00)
Triglycerides: 79 mg/dL (ref 0.0–149.0)

## 2012-04-14 LAB — URINALYSIS, ROUTINE W REFLEX MICROSCOPIC
Bilirubin Urine: NEGATIVE
Ketones, ur: NEGATIVE
Total Protein, Urine: NEGATIVE
pH: 6 (ref 5.0–8.0)

## 2012-04-24 ENCOUNTER — Encounter: Payer: Self-pay | Admitting: Internal Medicine

## 2012-04-24 ENCOUNTER — Ambulatory Visit (INDEPENDENT_AMBULATORY_CARE_PROVIDER_SITE_OTHER): Payer: BC Managed Care – PPO | Admitting: Internal Medicine

## 2012-04-24 VITALS — BP 162/90 | HR 99 | Temp 97.3°F | Ht 63.0 in | Wt 228.4 lb

## 2012-04-24 DIAGNOSIS — E119 Type 2 diabetes mellitus without complications: Secondary | ICD-10-CM

## 2012-04-24 DIAGNOSIS — Z0000 Encounter for general adult medical examination without abnormal findings: Secondary | ICD-10-CM

## 2012-04-24 DIAGNOSIS — IMO0001 Reserved for inherently not codable concepts without codable children: Secondary | ICD-10-CM

## 2012-04-24 MED ORDER — OLMESARTAN MEDOXOMIL-HCTZ 40-25 MG PO TABS: 1.0000 | ORAL_TABLET | Freq: Every day | ORAL | Status: DC

## 2012-04-24 MED ORDER — GLIMEPIRIDE 1 MG PO TABS: ORAL_TABLET | ORAL | Status: DC

## 2012-04-24 MED ORDER — PANTOPRAZOLE SODIUM 40 MG PO TBEC: 40.0000 mg | DELAYED_RELEASE_TABLET | Freq: Every day | ORAL | Status: DC

## 2012-04-24 MED ORDER — PRAVASTATIN SODIUM 20 MG PO TABS: 20.0000 mg | ORAL_TABLET | Freq: Every day | ORAL | Status: DC

## 2012-04-24 NOTE — Assessment & Plan Note (Signed)
D/w pt, to take OHA at [facility] AM which is when she gets up from sleeping during the day, as she eats prior and during work hours

## 2012-04-24 NOTE — Patient Instructions (Addendum)
Please take all new medication as prescribed- the pravastatin 20 mg per day Please continue all other medications as before, and refills have been done today Please continue your efforts at being more active, low cholesterol diabetic diet, and weight control. You are otherwise up to date with prevention measures today. Please remember to followup with your GYN for the yearly pap smear and/or mammogram Please call if you change your mind about the colonoscopy, and the podiatry for the foot pain Thank you for enrolling in MyChart. Please follow the instructions below to securely access your online medical record. MyChart allows you to send messages to your doctor, view your test results, renew your prescriptions, schedule appointments, and more. To Log into My Chart online, please go by [employer] or [employer] to [email or web address], or download the MyChart App from the [employer] .  Your Username is: [email or web address] (pass summer) Please return in 6 months, or sooner if needed, with Lab testing done 3-5 days before

## 2012-04-24 NOTE — Progress Notes (Signed)
Subjective:    Patient ID: Alice Hickman, female    DOB: Oct 13, 1950, 62 y.o.   MRN: [id number]  HPI   Here for wellness and f/u;  Overall doing ok;  Pt denies CP, worsening SOB, DOE, wheezing, orthopnea, PND, worsening LE edema, palpitations, dizziness or syncope.  Pt denies neurological change such as new headache, facial or extremity weakness.  Pt denies polydipsia, polyuria, or low sugar symptoms. Pt states overall good compliance with treatment and medications, good tolerability, and has been trying to follow lower cholesterol diet.  Pt denies worsening depressive symptoms, suicidal ideation or panic. No fever, night sweats, wt loss, loss of appetite, or other constitutional symptoms.  Pt states good ability with ADL's, has low fall risk, home safety reviewed and adequate, no other significant changes in hearing or vision, and only occasionally active with exercise. Tired today as she works third shift.  Due for refills. No acute complaints.  Has been taking her OHA prior to her bedtime when she gets home from work in the am. Past Medical History  Diagnosis Date  . DM w/o Complication Type II 84/69/6295  . HYPERLIPIDEMIA 05/01/2007  . HYPOKALEMIA 10/02/2008  . ANXIETY, SITUATIONAL 09/02/2009  . CARPAL TUNNEL SYNDROME, RIGHT 11/15/2006  . GLAUCOMA 07/04/2008  . HYPERTENSION 11/15/2006  . ALLERGIC RHINITIS 05/01/2007  . GERD 11/15/2006  . ANKLE PAIN, RIGHT 09/02/2009  . THUMB PAIN, RIGHT 04/02/2009  . OSTEOPENIA 09/02/2009  . Low back pain    Past Surgical History  Procedure Laterality Date  . Tonsillectomy    . Right shoulder sugury      reports that she has never smoked. She does not have any smokeless tobacco history on file. She reports that she does not drink alcohol or use illicit drugs. family history includes Cancer in her brother and other; Heart disease in her other; and Hypertension in her other. Allergies  Allergen Reactions  . Lovastatin     REACTION: leg cramps  . Metformin      REACTION: GI upset  . Penicillins     REACTION: erythema multiforme 2001   Current Outpatient Prescriptions on File Prior to Visit  Medication Sig Dispense Refill  . aspirin 81 MG tablet Take 81 mg by mouth daily.         No current facility-administered medications on file prior to visit.   Review of Systems Constitutional: Negative for diaphoresis, activity change, appetite change or unexpected weight change.  HENT: Negative for hearing loss, ear pain, facial swelling, mouth sores and neck stiffness.   Eyes: Negative for pain, redness and visual disturbance.  Respiratory: Negative for shortness of breath and wheezing.   Cardiovascular: Negative for chest pain and palpitations.  Gastrointestinal: Negative for diarrhea, blood in stool, abdominal distention or other pain Genitourinary: Negative for hematuria, flank pain or change in urine volume.  Musculoskeletal: Negative for myalgias and joint swelling.  Skin: Negative for color change and wound.  Neurological: Negative for syncope and numbness. other than noted Hematological: Negative for adenopathy.  Psychiatric/Behavioral: Negative for hallucinations, self-injury, decreased concentration and agitation.      Objective:   Physical Exam BP 162/90  Pulse 99  Temp(Src) 97.3 F (36.3 C) (Oral)  Ht 5\' 3"  (1.6 m)  Wt 228 lb 6 oz (103.59 kg)  BMI 40.46 kg/m2  SpO2 96% VS noted,  Constitutional: Pt is oriented to person, place, and time. Appears well-developed and well-nourished.  Head: Normocephalic and atraumatic.  Right Ear: External ear normal.  Left Ear:  External ear normal.  Nose: Nose normal.  Mouth/Throat: Oropharynx is clear and moist.  Eyes: Conjunctivae and EOM are normal. Pupils are equal, round, and reactive to light.  Neck: Normal range of motion. Neck supple. No JVD present. No tracheal deviation present.  Cardiovascular: Normal rate, regular rhythm, normal heart sounds and intact distal pulses.    Pulmonary/Chest: Effort normal and breath sounds normal.  Abdominal: Soft. Bowel sounds are normal. There is no tenderness. No HSM  Musculoskeletal: Normal range of motion. Exhibits no edema.  Lymphadenopathy:  Has no cervical adenopathy.  Neurological: Pt is alert and oriented to person, place, and time. Pt has normal reflexes. No cranial nerve deficit.  Skin: Skin is warm and dry. No rash noted.  Psychiatric:  Has  normal mood and affect. Behavior is normal.     Assessment & Plan:

## 2012-04-24 NOTE — Assessment & Plan Note (Signed)

## 2012-09-09 ENCOUNTER — Encounter (HOSPITAL_COMMUNITY): Payer: Self-pay | Admitting: Emergency Medicine

## 2012-09-09 ENCOUNTER — Emergency Department (HOSPITAL_COMMUNITY)
Admission: EM | Admit: 2012-09-09 | Discharge: 2012-09-09 | Disposition: A | Discharge status: Home / Self Care | Payer: BC Managed Care – PPO | Attending: Emergency Medicine | Admitting: Emergency Medicine

## 2012-09-09 ENCOUNTER — Emergency Department (HOSPITAL_COMMUNITY): Payer: BC Managed Care – PPO

## 2012-09-09 DIAGNOSIS — Z8709 Personal history of other diseases of the respiratory system: Secondary | ICD-10-CM | POA: Insufficient documentation

## 2012-09-09 DIAGNOSIS — S60229A Contusion of unspecified hand, initial encounter: Secondary | ICD-10-CM | POA: Insufficient documentation

## 2012-09-09 DIAGNOSIS — Z8659 Personal history of other mental and behavioral disorders: Secondary | ICD-10-CM | POA: Insufficient documentation

## 2012-09-09 DIAGNOSIS — M19049 Primary osteoarthritis, unspecified hand: Secondary | ICD-10-CM | POA: Insufficient documentation

## 2012-09-09 DIAGNOSIS — Y9389 Activity, other specified: Secondary | ICD-10-CM | POA: Insufficient documentation

## 2012-09-09 DIAGNOSIS — E119 Type 2 diabetes mellitus without complications: Secondary | ICD-10-CM | POA: Insufficient documentation

## 2012-09-09 DIAGNOSIS — M1990 Unspecified osteoarthritis, unspecified site: Secondary | ICD-10-CM

## 2012-09-09 DIAGNOSIS — E876 Hypokalemia: Secondary | ICD-10-CM | POA: Insufficient documentation

## 2012-09-09 DIAGNOSIS — K219 Gastro-esophageal reflux disease without esophagitis: Secondary | ICD-10-CM | POA: Insufficient documentation

## 2012-09-09 DIAGNOSIS — S60222A Contusion of left hand, initial encounter: Secondary | ICD-10-CM

## 2012-09-09 DIAGNOSIS — W19XXXA Unspecified fall, initial encounter: Secondary | ICD-10-CM | POA: Insufficient documentation

## 2012-09-09 DIAGNOSIS — Z880 Allergy status to penicillin: Secondary | ICD-10-CM | POA: Insufficient documentation

## 2012-09-09 DIAGNOSIS — I10 Essential (primary) hypertension: Secondary | ICD-10-CM | POA: Insufficient documentation

## 2012-09-09 DIAGNOSIS — Y929 Unspecified place or not applicable: Secondary | ICD-10-CM | POA: Insufficient documentation

## 2012-09-09 DIAGNOSIS — Z7982 Long term (current) use of aspirin: Secondary | ICD-10-CM | POA: Insufficient documentation

## 2012-09-09 DIAGNOSIS — Z79899 Other long term (current) drug therapy: Secondary | ICD-10-CM | POA: Insufficient documentation

## 2012-09-09 DIAGNOSIS — E785 Hyperlipidemia, unspecified: Secondary | ICD-10-CM | POA: Insufficient documentation

## 2012-09-09 DIAGNOSIS — Z8669 Personal history of other diseases of the nervous system and sense organs: Secondary | ICD-10-CM | POA: Insufficient documentation

## 2012-09-09 DIAGNOSIS — Z8739 Personal history of other diseases of the musculoskeletal system and connective tissue: Secondary | ICD-10-CM | POA: Insufficient documentation

## 2012-09-09 MED ORDER — HYDROCODONE-ACETAMINOPHEN 5-325 MG PO TABS: 1.0000 | ORAL_TABLET | ORAL | Status: DC | PRN

## 2012-09-09 MED ORDER — HYDROCODONE-ACETAMINOPHEN 5-325 MG PO TABS
2.0000 | ORAL_TABLET | Freq: Once | ORAL | Status: AC
  Administered 2012-09-09: 2 via ORAL
  Filled 2012-09-09: qty 2

## 2012-09-09 NOTE — ED Notes (Signed)
Pt states that her right knee gave out (which pt's state is normal), and when pt fell she tried to catch herself with her left hand. Pt having pain in her left hand and wrist. Pain 10/10

## 2012-09-09 NOTE — Discharge Instructions (Signed)

## 2012-09-09 NOTE — ED Provider Notes (Signed)
CSN: [id number]     Arrival date & time 09/09/12  1717 History  This chart was scribed for non-physician practitioner Teressa Lower, FNP,  working with Derwood Kaplan, MD, by Yevette Edwards, ED Scribe. This patient was seen in room WTR9/WTR9 and the patient's care was started at 6:00 PM.   First MD Initiated Contact with Patient 09/09/12 1732     Chief Complaint  Patient presents with  . Fall  . Hand Pain    The history is provided by the patient. No language interpreter was used.   HPI Commen Alice Hickman is a 62 y.o. female, with a h/o DM and HTN, who presents to the Emergency Department complaining of a fall. She reports her right knee gave out, which has occurred three times previously, and she attempted to catch herself with her left hand. She denies any head trauma or LOC associated with the fall. Her hand made contact with concrete. The pt reports she is experiencing pain to her left hand and wrist, and she rates the pain as a 10/10. The pt denies any prior issues with her left hand. The pt reports she has experienced intermittent pain to the right patellar region, but she is currently not experiencing pain to her knee. She has previously visited [address].   Past Medical History  Diagnosis Date  . DM w/o Complication Type II 11/15/2006  . HYPERLIPIDEMIA 05/01/2007  . HYPOKALEMIA 10/02/2008  . ANXIETY, SITUATIONAL 09/02/2009  . CARPAL TUNNEL SYNDROME, RIGHT 11/15/2006  . GLAUCOMA 07/04/2008  . HYPERTENSION 11/15/2006  . ALLERGIC RHINITIS 05/01/2007  . GERD 11/15/2006  . ANKLE PAIN, RIGHT 09/02/2009  . THUMB PAIN, RIGHT 04/02/2009  . OSTEOPENIA 09/02/2009  . Low back pain    Past Surgical History  Procedure Laterality Date  . Tonsillectomy    . Right shoulder sugury     Family History  Problem Relation Age of Onset  . Cancer Brother     throat cancer, smoker  . Heart disease Other   . Hypertension Other   . Cancer Other     lung cancer   History  Substance  Use Topics  . Smoking status: Never Smoker   . Smokeless tobacco: Not on file  . Alcohol Use: No   No OB history provided.  Review of Systems  Musculoskeletal: Positive for arthralgias (Left hand and wrist).  Skin: Negative for wound.  Neurological: Negative for syncope.  All other systems reviewed and are negative.    Allergies  Lovastatin; Metformin; and Penicillins  Home Medications   Current Outpatient Rx  Name  Route  Sig  Dispense  Refill  . aspirin 81 MG tablet   Oral   Take 81 mg by mouth daily.           Marland Kitchen glimepiride (AMARYL) 1 MG tablet      Take 1 and 1/2 pills in the AM   135 tablet   3   . olmesartan-hydrochlorothiazide (BENICAR HCT) 40-25 MG per tablet   Oral   Take 1 tablet by mouth daily.   90 tablet   3   . pantoprazole (PROTONIX) 40 MG tablet   Oral   Take 1 tablet (40 mg total) by mouth daily.   90 tablet   3   . pravastatin (PRAVACHOL) 20 MG tablet   Oral   Take 1 tablet (20 mg total) by mouth daily.   90 tablet   3    Triage Vitals: Pulse 81  Temp(Src) 98.7  F (37.1 C) (Oral)  Resp 19  SpO2 98%  Physical Exam  Nursing note and vitals reviewed. Constitutional: She is oriented to person, place, and time. She appears well-developed and well-nourished. No distress.  HENT:  Head: Normocephalic and atraumatic.  Eyes: EOM are normal.  Neck: Neck supple. No tracheal deviation present.  Cardiovascular: Normal rate.   Pulmonary/Chest: Effort normal. No respiratory distress.  Musculoskeletal: Normal range of motion.  Swelling noted at palmar aspect of left hand at base of thumb.  Snuff box tenderness at left hand. Full ROM of left hand.   Neurological: She is alert and oriented to person, place, and time.  Skin: Skin is warm and dry.  Psychiatric: She has a normal mood and affect. Her behavior is normal.    ED Course   DIAGNOSTIC STUDIES: Oxygen Saturation is 98% on room air, normal by my interpretation.    COORDINATION OF  CARE:  6:03 PM- Discussed treatment plan with patient which includes imaging, and the patient agreed to the plan.   Procedures (including critical care time)  Labs Reviewed  GLUCOSE, CAPILLARY - Abnormal; Notable for the following:    Glucose-Capillary 109 (*)    All other components within normal limits   Dg Wrist Complete Left  09/09/2012   *RADIOLOGY REPORT*  Clinical Data: Left wrist and hand pain status post fall.  LEFT WRIST - COMPLETE 3+ VIEW  Comparison: Left hand radiographs 02/14/2010.  Findings: There is stable radiocarpal joint space loss. Osteoarthritic changes are present at the first carpal metacarpal articulation.  There is no evidence of acute fracture, dislocation or erosive change.  There is generalized prominence of the soft tissues, similar to the prior study.  IMPRESSION: No acute osseous findings.  Osteoarthritis.   Original Report Authenticated By: Carey Bullocks, M.D.   Dg Knee Complete 4 Views Right  09/09/2012   *RADIOLOGY REPORT*  Clinical Data: Generalized knee pain since falling today. Instability.  RIGHT KNEE - COMPLETE 4+ VIEW  Comparison: None.  Findings: The bones appear mildly demineralized.  There are tricompartmental degenerative changes, most advanced medially. There is some meniscal chondrocalcinosis medially.  No acute fracture or dislocation is demonstrated.  There is spurring at the quadriceps insertion on the patella.  There is no significant knee joint effusion.  IMPRESSION: Tricompartmental osteoarthritis.  No acute osseous findings.   Original Report Authenticated By: Carey Bullocks, M.D.   Dg Hand Complete Left  09/09/2012   *RADIOLOGY REPORT*  Clinical Data: Left wrist and hand pain status post fall.  LEFT HAND - COMPLETE 3+ VIEW  Comparison: 02/14/2010 radiographs.  Findings: In addition to degenerative changes in the wrist described on the separate report, there are interphalangeal degenerative changes which are stable.  There is no evidence of acute  fracture, dislocation or focal soft tissue swelling.  IMPRESSION: No acute osseous findings.  Stable degenerative changes.   Original Report Authenticated By: Carey Bullocks, M.D.   1. Hand contusion, left, initial encounter   2. Osteoarthritis     MDM  No acute bony abnormality noted:pt wrapped for comfort  I personally performed the services described in this documentation, which was scribed in my presence. The recorded information has been reviewed and is accurate.    Teressa Lower, NP 09/09/12 1836

## 2012-09-11 ENCOUNTER — Ambulatory Visit (INDEPENDENT_AMBULATORY_CARE_PROVIDER_SITE_OTHER): Payer: BC Managed Care – PPO | Admitting: Internal Medicine

## 2012-09-11 ENCOUNTER — Encounter: Payer: Self-pay | Admitting: Internal Medicine

## 2012-09-11 VITALS — BP 140/90 | HR 97 | Temp 97.7°F | Ht 63.0 in | Wt 226.5 lb

## 2012-09-11 DIAGNOSIS — S60222A Contusion of left hand, initial encounter: Secondary | ICD-10-CM | POA: Insufficient documentation

## 2012-09-11 DIAGNOSIS — M25571 Pain in right ankle and joints of right foot: Secondary | ICD-10-CM

## 2012-09-11 DIAGNOSIS — S60222D Contusion of left hand, subsequent encounter: Secondary | ICD-10-CM

## 2012-09-11 DIAGNOSIS — M1711 Unilateral primary osteoarthritis, right knee: Secondary | ICD-10-CM

## 2012-09-11 DIAGNOSIS — Z5189 Encounter for other specified aftercare: Secondary | ICD-10-CM

## 2012-09-11 DIAGNOSIS — M1710 Unilateral primary osteoarthritis, unspecified knee: Secondary | ICD-10-CM

## 2012-09-11 DIAGNOSIS — G5750 Tarsal tunnel syndrome, unspecified lower limb: Secondary | ICD-10-CM

## 2012-09-11 DIAGNOSIS — Z9181 History of falling: Secondary | ICD-10-CM

## 2012-09-11 DIAGNOSIS — M25579 Pain in unspecified ankle and joints of unspecified foot: Secondary | ICD-10-CM

## 2012-09-11 DIAGNOSIS — G5751 Tarsal tunnel syndrome, right lower limb: Secondary | ICD-10-CM

## 2012-09-11 DIAGNOSIS — R296 Repeated falls: Secondary | ICD-10-CM | POA: Insufficient documentation

## 2012-09-11 MED ORDER — HYDROCODONE-ACETAMINOPHEN 5-325 MG PO TABS: 1.0000 | ORAL_TABLET | ORAL | Status: DC | PRN

## 2012-09-11 NOTE — Patient Instructions (Signed)
Please continue all other medications as before, and refills have been done if requested, including a few more of the vicodin for pain You will be contacted regarding the referral for: Dr Hewitt/ortho for the right ankle You will be contacted regarding the referral for: other orthopedic (Dr Darrelyn Hillock of [facility] if ok with your insurance) for the right knee and recurring falls You are given the work note  Please remember to sign up for My Chart if you have not done so, as this will be important to you in the future with finding out test results, communicating by private email, and scheduling acute appointments online when needed.

## 2012-09-11 NOTE — Progress Notes (Signed)
Subjective:    Patient ID: Alice Hickman, female    DOB: October 20, 1950, 62 y.o.   MRN: [id number]  HPI here to f/u, unfortunately after right knee and leg gave away descending stairs and fell down the last few stairs, striking the left hand primarily to save hitting her head, has large bruise and sprained wrist it seems; seen in ER with neg film for fx, but left hand and wrist with persistent 2-3+ tender, swelling, bruise, stiffness.  This is third fall with sudden right knee giveaway in the past month, has ongoing pain with walking/standing.  Also has ongoing worsening right ankle known tarsal tunnell pain, seen per Dr Gloris Manchester 2013.  Pt denies chest pain, increased sob or doe, wheezing, orthopnea, PND, increased LE swelling, palpitations, dizziness or syncope.   Pt denies polydipsia, polyuria, Past Medical History  Diagnosis Date  . DM w/o Complication Type II 40/81/4481  . HYPERLIPIDEMIA 05/01/2007  . HYPOKALEMIA 10/02/2008  . ANXIETY, SITUATIONAL 09/02/2009  . CARPAL TUNNEL SYNDROME, RIGHT 11/15/2006  . GLAUCOMA 07/04/2008  . HYPERTENSION 11/15/2006  . ALLERGIC RHINITIS 05/01/2007  . GERD 11/15/2006  . ANKLE PAIN, RIGHT 09/02/2009  . THUMB PAIN, RIGHT 04/02/2009  . OSTEOPENIA 09/02/2009  . Low back pain    Past Surgical History  Procedure Laterality Date  . Tonsillectomy    . Right shoulder sugury      reports that she has never smoked. She does not have any smokeless tobacco history on file. She reports that she does not drink alcohol or use illicit drugs. family history includes Cancer in her brother and other; Heart disease in her other; and Hypertension in her other. Allergies  Allergen Reactions  . Lovastatin     REACTION: leg cramps  . Metformin     REACTION: GI upset  . Penicillins     REACTION: erythema multiforme 2001   Current Outpatient Prescriptions on File Prior to Visit  Medication Sig Dispense Refill  . aspirin 81 MG tablet Take 81 mg by mouth daily.        . fish  oil-omega-3 fatty acids 1000 MG capsule Take 2 g by mouth daily.      Marland Kitchen glimepiride (AMARYL) 1 MG tablet Take 1 and 1/2 pills in the AM  135 tablet  3  . olmesartan-hydrochlorothiazide (BENICAR HCT) 40-25 MG per tablet Take 1 tablet by mouth daily.  90 tablet  3  . pantoprazole (PROTONIX) 40 MG tablet Take 1 tablet (40 mg total) by mouth daily.  90 tablet  3  . pravastatin (PRAVACHOL) 20 MG tablet Take 1 tablet (20 mg total) by mouth daily.  90 tablet  3   No current facility-administered medications on file prior to visit.   Review of Systems  Constitutional: Negative for unexpected weight change, or unusual diaphoresis  HENT: Negative for tinnitus.   Eyes: Negative for photophobia and visual disturbance.  Respiratory: Negative for choking and stridor.   Gastrointestinal: Negative for vomiting and blood in stool.  Genitourinary: Negative for hematuria and decreased urine volume.  Musculoskeletal: Negative for acute joint swelling Skin: Negative for color change and wound.  Neurological: Negative for tremors and numbness other than noted  Psychiatric/Behavioral: Negative for decreased concentration or  hyperactivity.       Objective:   Physical Exam BP 140/90  Pulse 97  Temp(Src) 97.7 F (36.5 C) (Oral)  Ht 5\' 3"  (1.6 m)  Wt 226 lb 8 oz (102.74 kg)  BMI 40.13 kg/m2  SpO2 99% VS noted,  Constitutional: Pt appears well-developed and well-nourished.  HENT: Head: NCAT.  Right Ear: External ear normal.  Left Ear: External ear normal.  Eyes: Conjunctivae and EOM are normal. Pupils are equal, round, and reactive to light.  Neck: Normal range of motion. Neck supple.  Cardiovascular: Normal rate and regular rhythm.   Pulmonary/Chest: Effort normal and breath sounds normal.  Left hand/thumb large contusion/swelling/tedner without erythema, fluctuance, drainage Neurological: Pt is alert. Not confused  Skin: Skin is warm. No erythema.  Psychiatric: Pt behavior is normal. Thought  content normal.  Right knee without effusion, but decrased ROM, crepitus    Assessment & Plan:

## 2012-09-11 NOTE — Assessment & Plan Note (Signed)
For pain control, cont'd monitoring for any worsening,  to f/u any worsening symptoms or concerns

## 2012-09-11 NOTE — Assessment & Plan Note (Signed)
Need to address right knee and ankle to avoid further falls

## 2012-09-11 NOTE — Assessment & Plan Note (Signed)
For ortho referral, dr hewitt/[facility]

## 2012-09-11 NOTE — Assessment & Plan Note (Signed)
Worsening function recent , for ortho referral as well

## 2012-09-11 NOTE — Assessment & Plan Note (Signed)
As above/right ankle pain

## 2012-09-13 NOTE — ED Provider Notes (Signed)
Medical screening examination/treatment/procedure(s) were performed by non-physician practitioner and as supervising physician I was immediately available for consultation/collaboration.  Derwood Kaplan, MD 09/13/12 1217

## 2012-09-14 DIAGNOSIS — Z0279 Encounter for issue of other medical certificate: Secondary | ICD-10-CM

## 2012-09-20 DIAGNOSIS — Z0279 Encounter for issue of other medical certificate: Secondary | ICD-10-CM

## 2012-10-27 ENCOUNTER — Ambulatory Visit (INDEPENDENT_AMBULATORY_CARE_PROVIDER_SITE_OTHER): Payer: BC Managed Care – PPO | Admitting: Internal Medicine

## 2012-10-27 ENCOUNTER — Encounter: Payer: Self-pay | Admitting: Internal Medicine

## 2012-10-27 ENCOUNTER — Other Ambulatory Visit (INDEPENDENT_AMBULATORY_CARE_PROVIDER_SITE_OTHER): Payer: BC Managed Care – PPO

## 2012-10-27 ENCOUNTER — Other Ambulatory Visit: Payer: Self-pay | Admitting: Internal Medicine

## 2012-10-27 VITALS — BP 138/80 | HR 78 | Temp 97.3°F | Ht 63.0 in | Wt 227.0 lb

## 2012-10-27 DIAGNOSIS — IMO0001 Reserved for inherently not codable concepts without codable children: Secondary | ICD-10-CM

## 2012-10-27 DIAGNOSIS — M25569 Pain in unspecified knee: Secondary | ICD-10-CM

## 2012-10-27 DIAGNOSIS — E119 Type 2 diabetes mellitus without complications: Secondary | ICD-10-CM

## 2012-10-27 DIAGNOSIS — M19079 Primary osteoarthritis, unspecified ankle and foot: Secondary | ICD-10-CM

## 2012-10-27 DIAGNOSIS — E785 Hyperlipidemia, unspecified: Secondary | ICD-10-CM

## 2012-10-27 DIAGNOSIS — Z23 Encounter for immunization: Secondary | ICD-10-CM

## 2012-10-27 DIAGNOSIS — M159 Polyosteoarthritis, unspecified: Secondary | ICD-10-CM

## 2012-10-27 DIAGNOSIS — M25561 Pain in right knee: Secondary | ICD-10-CM

## 2012-10-27 DIAGNOSIS — I10 Essential (primary) hypertension: Secondary | ICD-10-CM

## 2012-10-27 DIAGNOSIS — Z0000 Encounter for general adult medical examination without abnormal findings: Secondary | ICD-10-CM

## 2012-10-27 HISTORY — DX: Polyosteoarthritis, unspecified: M15.9

## 2012-10-27 LAB — HEPATIC FUNCTION PANEL
Albumin: 3.8 g/dL (ref 3.5–5.2)
Total Protein: 7.4 g/dL (ref 6.0–8.3)

## 2012-10-27 LAB — BASIC METABOLIC PANEL
BUN: 10 mg/dL (ref 6–23)
CO2: 29 mEq/L (ref 19–32)
Calcium: 9.5 mg/dL (ref 8.4–10.5)
GFR: 101.25 mL/min (ref >60.00)
Glucose, Bld: 119 mg/dL — ABNORMAL HIGH (ref 70–99)

## 2012-10-27 LAB — HEMOGLOBIN A1C: Hgb A1c MFr Bld: 7.6 % — ABNORMAL HIGH (ref 4.6–6.5)

## 2012-10-27 LAB — LIPID PANEL
Cholesterol: 190 mg/dL (ref 0–200)
HDL: 52.3 mg/dL (ref >39.00)
Triglycerides: 99 mg/dL (ref 0.0–149.0)
VLDL: 19.8 mg/dL (ref 0.0–40.0)

## 2012-10-27 MED ORDER — PRAVASTATIN SODIUM 20 MG PO TABS: 20.0000 mg | ORAL_TABLET | Freq: Every day | ORAL | Status: DC

## 2012-10-27 MED ORDER — PRAVASTATIN SODIUM 40 MG PO TABS: 40.0000 mg | ORAL_TABLET | Freq: Every day | ORAL | Status: DC

## 2012-10-27 MED ORDER — GLIMEPIRIDE 2 MG PO TABS: 2.0000 mg | ORAL_TABLET | Freq: Every day | ORAL | Status: DC

## 2012-10-27 NOTE — Progress Notes (Signed)
Subjective:    Patient ID: Alice Hickman, female    DOB: 25-Dec-1950, 62 y.o.   MRN: [id number]  HPI Here to f/u; overall doing ok,  Pt denies chest pain, increased sob or doe, wheezing, orthopnea, PND, increased LE swelling, palpitations, dizziness or syncope.  Pt denies polydipsia, polyuria, or low sugar symptoms such as weakness or confusion improved with po intake.  Pt denies new neurological symptoms such as new headache, or facial or extremity weakness or numbness.   Pt states overall good compliance with meds, has been trying to follow lower cholesterol, diabetic diet, with wt overall stable,  but little exercise however.  Now with stinging burning pain to plantar distal bilat feet, not better with foot soaks, some better with working with tennis ball.   Has not yet started the pravastatin  Now walks with cane all the time, uses the cart at work, no recent falls.  Has been rec'd for right knee TKR, but putting this off.  Has also swelling to both knees, ankles. Thinking about applying for disablity.   Pt continues to have recurring LBP from her PHN without change in severity, sand no bowel or bladder change, fever, wt loss,  worsening LE pain/numbness/weakness, gait change or falls. Past Medical History  Diagnosis Date  . DM w/o Complication Type II 62/95/2841  . HYPERLIPIDEMIA 05/01/2007  . HYPOKALEMIA 10/02/2008  . ANXIETY, SITUATIONAL 09/02/2009  . CARPAL TUNNEL SYNDROME, RIGHT 11/15/2006  . GLAUCOMA 07/04/2008  . HYPERTENSION 11/15/2006  . ALLERGIC RHINITIS 05/01/2007  . GERD 11/15/2006  . ANKLE PAIN, RIGHT 09/02/2009  . THUMB PAIN, RIGHT 04/02/2009  . OSTEOPENIA 09/02/2009  . Low back pain   . Generalized osteoarthritis 10/27/2012    bilat ankles, knees, hands and wrists   Past Surgical History  Procedure Laterality Date  . Tonsillectomy    . Right shoulder sugury      reports that she has never smoked. She does not have any smokeless tobacco history on file. She reports that she does not  drink alcohol or use illicit drugs. family history includes Cancer in her brother and other; Heart disease in her other; Hypertension in her other. Allergies  Allergen Reactions  . Lovastatin     REACTION: leg cramps  . Metformin     REACTION: GI upset  . Penicillins     REACTION: erythema multiforme 2001   Current Outpatient Prescriptions on File Prior to Visit  Medication Sig Dispense Refill  . aspirin 81 MG tablet Take 81 mg by mouth daily.        . fish oil-omega-3 fatty acids 1000 MG capsule Take 2 g by mouth daily.      Marland Kitchen olmesartan-hydrochlorothiazide (BENICAR HCT) 40-25 MG per tablet Take 1 tablet by mouth daily.  90 tablet  3  . pantoprazole (PROTONIX) 40 MG tablet Take 1 tablet (40 mg total) by mouth daily.  90 tablet  3  . HYDROcodone-acetaminophen (NORCO/VICODIN) 5-325 MG per tablet Take 1 tablet by mouth every 4 (four) hours as needed for pain.  30 tablet  0   No current facility-administered medications on file prior to visit.   Review of Systems  Constitutional: Negative for unexpected weight change, or unusual diaphoresis  HENT: Negative for tinnitus.   Eyes: Negative for photophobia and visual disturbance.  Respiratory: Negative for choking and stridor.   Gastrointestinal: Negative for vomiting and blood in stool.  Genitourinary: Negative for hematuria and decreased urine volume.  Musculoskeletal: Negative for acute joint swelling Skin:  Negative for color change and wound.  Neurological: Negative for tremors and numbness other than noted  Psychiatric/Behavioral: Negative for decreased concentration or  hyperactivity.       Objective:   Physical Exam BP 138/80  Pulse 78  Temp(Src) 97.3 F (36.3 C) (Oral)  Ht 5\' 3"  (1.6 m)  Wt 227 lb (102.967 kg)  BMI 40.22 kg/m2  SpO2 98% VS noted,  Constitutional: Pt appears well-developed and well-nourished.  HENT: Head: NCAT.  Right Ear: External ear normal.  Left Ear: External ear normal.  Eyes: Conjunctivae and  EOM are normal. Pupils are equal, round, and reactive to light.  Neck: Normal range of motion. Neck supple.  Cardiovascular: Normal rate and regular rhythm.   Pulmonary/Chest: Effort normal and breath sounds normal.  Abd:  Soft, NT, non-distended, + BS Neurological: Pt is alert. Not confused  Right knee with crepitus, 2+ effusion, decr ROM Bilat ankles with 1-2+ effusions, decr ROM Skin: Skin is warm. No erythema.  Psychiatric: Pt behavior is normal. Thought content normal.      Assessment & Plan:

## 2012-10-27 NOTE — Patient Instructions (Addendum)
You had the flu shot today Please continue all other medications as before, including the pravastatin for cholesterol Please have the pharmacy call with any other refills you may need. You will be contacted regarding the referral for: Dr Katrinka Blazing for the right knee and both ankle swellings Please go to the LAB in the Basement (turn left off the elevator) for the tests to be done today You will be contacted by phone if any changes need to be made immediately.  Otherwise, you will receive a letter about your results with an explanation, but please check with MyChart first.  Please return in 6 months, or sooner if needed, with Lab testing done 3-5 days before

## 2012-10-29 NOTE — Assessment & Plan Note (Signed)
With flares to right knee and bilat ankles, for refer to dr Smith/[facility] in our office

## 2012-10-29 NOTE — Assessment & Plan Note (Signed)
stable overall by history and exam, recent data reviewed with pt, and pt to continue medical treatment as before,  to f/u any worsening symptoms or concerns BP Readings from Last 3 Encounters:  10/27/12 138/80  09/11/12 140/90  09/09/12 191/81

## 2012-10-29 NOTE — Assessment & Plan Note (Signed)
stable overall by history and exam, recent data reviewed with pt, and pt to continue medical treatment as before,  to f/u any worsening symptoms or concerns' Lab Results  Component Value Date   HGBA1C 7.6* 10/27/2012    

## 2012-10-29 NOTE — Assessment & Plan Note (Signed)
stable overall by history and exam, recent data reviewed with pt, and pt to continue medical treatment as before,  to f/u any worsening symptoms or concerns Lab Results  Component Value Date   LDLCALC 118* 10/27/2012

## 2012-10-30 ENCOUNTER — Telehealth: Payer: Self-pay | Admitting: *Deleted

## 2012-10-30 ENCOUNTER — Ambulatory Visit: Payer: BC Managed Care – PPO | Admitting: Family Medicine

## 2012-10-30 MED ORDER — LIDOCAINE 5 % EX PTCH: 1.0000 | MEDICATED_PATCH | Freq: Three times a day (TID) | CUTANEOUS | Status: DC | PRN

## 2012-10-30 NOTE — Telephone Encounter (Signed)
Done erx 

## 2012-10-30 NOTE — Telephone Encounter (Signed)
Pt called states she was to believe she was to be prescribed Lidoderm 5% Patch on 9.26.14.  Please advise

## 2012-10-31 ENCOUNTER — Encounter: Payer: Self-pay | Admitting: Family Medicine

## 2012-10-31 ENCOUNTER — Ambulatory Visit (INDEPENDENT_AMBULATORY_CARE_PROVIDER_SITE_OTHER): Payer: BC Managed Care – PPO | Admitting: Family Medicine

## 2012-10-31 VITALS — BP 140/88 | HR 79 | Wt 224.0 lb

## 2012-10-31 DIAGNOSIS — M1711 Unilateral primary osteoarthritis, right knee: Secondary | ICD-10-CM

## 2012-10-31 DIAGNOSIS — M1710 Unilateral primary osteoarthritis, unspecified knee: Secondary | ICD-10-CM

## 2012-10-31 DIAGNOSIS — M25471 Effusion, right ankle: Secondary | ICD-10-CM | POA: Insufficient documentation

## 2012-10-31 DIAGNOSIS — IMO0002 Reserved for concepts with insufficient information to code with codable children: Secondary | ICD-10-CM

## 2012-10-31 DIAGNOSIS — M7989 Other specified soft tissue disorders: Secondary | ICD-10-CM

## 2012-10-31 MED ORDER — FUROSEMIDE 20 MG PO TABS: 10.0000 mg | ORAL_TABLET | Freq: Every day | ORAL | Status: DC

## 2012-10-31 NOTE — Assessment & Plan Note (Signed)
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 Discussed wearing proper shoe wear and patient was given a prescription for Lasix 10 mg daily. Patient does have a history of hypokalemia I do think this looks like it is more secondary to elevated blood glucose. Patient is to return in 2 weeks with primary care provider to have potassium checked. She does not return I will see her again and we will get labs in 4 weeks.

## 2012-10-31 NOTE — Progress Notes (Signed)
  I'm seeing this patient by the request  of:  Dr. Jonny Ruiz  CC: Right knee pain and bilateral ankle swelling  HPI: Patient is a very pleasant 62 year old female coming in with right knee pain and bilateral ankle swelling. Regarding her right knee pain she states that she has had this for some 8 months. Patient does have a popping sensation it doesn't cause a lot of pain but at the end of a long shift she can have some discomfort on the medial aspect of the knee. Patient has been seen by her primary care provider in one month ago did have x-rays which were reviewed by me. Patient's x-rays show that she does have tricompartmental moderate to severe osteoarthritic changes. X-rays were nonweightbearing. Patient says the pain is more of a dull aching sensation that is worse with increased activity. Patient states it does respond somewhat to Tylenol. Patient was the severity of 5/10.  Patient is also having bilateral ankle swelling. Patient states it seems to be whenever she is on her feet a longer amount of time. Patient denies any significant pain but states that she did have discomfort at the end of a long day. Patient has not tried any home remedies. Patient has not tried changing shoes or elevation. Patient does not read any change in medications recently. Patient was the severity of 3/10.  Past medical, surgical, family and social history reviewed. Medications reviewed all in the electronic medical record.  Review of Systems: No headache, visual changes, nausea, vomiting, diarrhea, constipation, dizziness, abdominal pain, skin rash, fevers, chills, night sweats, weight loss, swollen lymph nodes, body aches, joint swelling, muscle aches, chest pain, shortness of breath, mood changes.   Objective:    Blood pressure 140/88, pulse 79, weight 224 lb (101.606 kg), SpO2 97.00%.   General: No apparent distress alert and oriented x3 mood and affect normal, dressed appropriately. Obese HEENT: Pupils equal,  extraocular movements intact Respiratory: Patient's speak in full sentences and does not appear short of breath Cardiovascular: No lower extremity edema, non tender, no erythema Skin: Warm dry intact with no signs of infection or rash on extremities or on axial skeleton. Abdomen: Soft nontender Neuro: Cranial nerves II through XII are intact, neurovascularly intact in all extremities with 2+ DTRs and 2+ pulses. Lymph: No lymphadenopathy of posterior or anterior cervical chain or axillae bilaterally.  Gait antalgic gait and walks with a cane MSK: Non tender with full range of motion and good stability and symmetric strength and tone of shoulders, elbows, wrist, hip, knee and ankles bilaterally. Ankles bilaterally show that patient does have dependent edema but has full range of motion bilaterally with 5 out of 5 strength in neurovascularly intact. Knee: Right Normal to inspection with no erythema or effusion or obvious bony abnormalities. Palpation normal with no warmth, joint line tenderness, but mild patellar tenderness,ROM full in flexion and extension and lower leg rotation. Ligaments with solid consistent endpoints including ACL, PCL, LCL, MCL. Negative Mcmurray's, Apley's, and Thessalonian tests. Positive painful patellar compression. Patellar glide with significant crepitus. Patellar and quadriceps tendons unremarkable. Hamstring and quadriceps strength is normal.     Impression and Recommendations:     This case required medical decision making of moderate complexity.

## 2012-10-31 NOTE — Assessment & Plan Note (Addendum)
Patient does have degenerative changes of the right knee and probably moderate to severe when looking at the x-rays. In addition patient does have patellofemoral syndrome that is likely giving a popping sensation she discusses. Patient was given a brace today and proper strapping. Patient given information of over-the-counter medications that could be helpful for arthritis and likely will be safe with all her medications. Discussed weight loss Discussed proper shoe wear Patient is ambulating with the aid of a cane. Patient and will come back again in 4 weeks for further evaluation. She continues to have pain we'll do a corticosteroid injection.

## 2012-10-31 NOTE — Patient Instructions (Signed)
Very nice to meet you Try the brace today Also for your swelling in your ankles try lasix 1/2 tab daily Take tylenol 650 mg three times a day is the best evidence based medicine we have for arthritis.  Aleve 1-2 tabs twice a day with food or ibuprofen 600mg  twice daily with food can be added to tylenol.  Glucosamine sulfate 750mg  twice a day is a supplement that has been shown to help moderate to severe arthritis. Vitamin D 1000IU daily can help.  Capsaicin topically up to four times a day may also help with pain. Cortisone injections are an option if these interventions do not seem to make a difference or need more relief.  If cortisone injections do not help, there are different types of shots that may help but they take longer to take effect.  We can discuss this at follow up.  It's important that you continue to stay active. Controlling your weight is important.  Consider physical therapy to strengthen muscles around the joint that hurts to take pressure off of the joint itself. Shoe inserts with good arch support may be helpful.  At least wear tennis shoes at work.  Heat or ice 20 minutes at a time 3-4 times a day as needed to help with pain. Water aerobics and cycling with low resistance are the best two types of exercise for arthritis. Come back and see me in 4 weeks.

## 2012-12-01 ENCOUNTER — Encounter: Payer: Self-pay | Admitting: Family Medicine

## 2012-12-01 ENCOUNTER — Ambulatory Visit (INDEPENDENT_AMBULATORY_CARE_PROVIDER_SITE_OTHER): Payer: BC Managed Care – PPO | Admitting: Family Medicine

## 2012-12-01 VITALS — BP 132/80 | HR 68

## 2012-12-01 DIAGNOSIS — M1710 Unilateral primary osteoarthritis, unspecified knee: Secondary | ICD-10-CM

## 2012-12-01 DIAGNOSIS — M170 Bilateral primary osteoarthritis of knee: Secondary | ICD-10-CM

## 2012-12-01 NOTE — Progress Notes (Signed)
CC: Right knee pain and new left knee pain.   HPI: Patient is following up for her right knee pain. 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 Patient has been doing conservative therapy with bracing, over-the-counter medications, and exercises. Patient states that she does think it is doing better. Patient states that the brace does not fit significantly well since she has not been wearing it regularly. Patient denies any new symptoms this is radiation down her leg or any numbness. She does taking the Tylenol and states that it seems to be helpful. Patient unfortunately states that she started to have more of a left knee pain that is very similar to her right knee. Patient states it still hurts with ambulation and standing for long amount of time on concrete. Patient has changed the working environment at work which has been beneficial especially with not lifting heavy boxes. Patient is still ambulating with the aid of a cane. Patient states that this left knee pain is approximately 5/10 which is very comparable to her right knee pain. No other symptoms different than her contralateral knee.  Patient is also having bilateral ankle swelling. Patient states it seems to be whenever she is on her feet a longer amount of time. Patient denies any significant pain but states that she did have discomfort at the end of a long day. Patient has not tried any home remedies. Patient has not tried changing shoes or elevation. Patient does not read any change in medications recently. Patient was the severity of 3/10.  Past medical, surgical, family and social history reviewed. Medications reviewed all in the electronic medical record.  Review of Systems: No headache, visual changes, nausea, vomiting, diarrhea, constipation, dizziness, abdominal pain, skin rash, fevers, chills, night sweats, weight loss, swollen lymph nodes, body aches, joint swelling, muscle aches, chest  pain, shortness of breath, mood changes.   Objective:    Blood pressure 132/80, pulse 68, SpO2 98.00%.   General: No apparent distress alert and oriented x3 mood and affect normal, dressed appropriately. Obese HEENT: Pupils equal, extraocular movements intact Respiratory: Patient's speak in full sentences and does not appear short of breath Cardiovascular: No lower extremity edema, non tender, no erythema Skin: Warm dry intact with no signs of infection or rash on extremities or on axial skeleton. Abdomen: Soft nontender Neuro: Cranial nerves II through XII are intact, neurovascularly intact in all extremities with 2+ DTRs and 2+ pulses. Lymph: No lymphadenopathy of posterior or anterior cervical chain or axillae bilaterally.  Gait antalgic gait and walks with a cane MSK: Non tender with full range of motion and good stability and symmetric strength and tone of shoulders, elbows, wrist, hip and ankles bilaterally. Ankles bilaterally show that patient does have dependent edema but has full range of motion bilaterally with 5 out of 5 strength in neurovascularly intact. Knee: Right Normal to inspection with no erythema or effusion or obvious bony abnormalities. Palpation normal with no warmth, joint line tenderness, but mild patellar tenderness,ROM full in flexion and extension and lower leg rotation. Ligaments with solid consistent endpoints including ACL, PCL, LCL, MCL. Negative Mcmurray's, Apley's, and Thessalonian tests. Positive painful patellar compression. Patellar glide with significant crepitus. Patellar and quadriceps tendons unremarkable. Hamstring and quadriceps strength is normal.  Knee: Left Normal to inspection with no erythema or effusion or obvious bony abnormalities. Palpation shows patient is tender to palpation mostly over the medial joint line. ROM full in flexion and extension and lower  leg rotation. Ligaments with solid consistent endpoints including ACL, PCL, LCL,  MCL. Negative Mcmurray's, Apley's, and Thessalonian tests. Positive painful patellar compression. Patellar glide with crepitus. Patellar and quadriceps tendons unremarkable. Hamstring and quadriceps strength is normal.      Impression and Recommendations:     This case required medical decision making of moderate complexity.

## 2012-12-01 NOTE — Assessment & Plan Note (Signed)
Patient does have endstage osteoarthritis of the right knee is likely has similar disease in the left knee. No further x-rays were done today because it would not change management. Patient was given other potential treatment options and chose to continue with conservative therapy. Patient will start formal physical therapy which I think will be beneficial. Encourage her to continue home exercises as well as the over-the-counter medications. I also did recommend vitamin D supplementation. Patient will come back again in 4 weeks for further evaluation.  Patient was offered potential corticosteroid injection which she declined today. At followup is still having pain I would encourage it again.

## 2012-12-01 NOTE — Patient Instructions (Signed)
Good to see you Physical therapy will be calling you Call the number to return the brace.  You need a wide toe rigid shoe with good insoles.  Randel Pigg, [employer], [employer]) Continue tylenol, fish oil Vitamin D 1000-2000IU daily.  Capsaicin cream for you knees topically.  Come back in 4 weeks to check in.

## 2012-12-07 ENCOUNTER — Ambulatory Visit: Payer: BC Managed Care – PPO | Attending: Family Medicine | Admitting: Physical Therapy

## 2012-12-07 DIAGNOSIS — M25569 Pain in unspecified knee: Secondary | ICD-10-CM | POA: Insufficient documentation

## 2012-12-07 DIAGNOSIS — R262 Difficulty in walking, not elsewhere classified: Secondary | ICD-10-CM | POA: Insufficient documentation

## 2012-12-07 DIAGNOSIS — R269 Unspecified abnormalities of gait and mobility: Secondary | ICD-10-CM | POA: Insufficient documentation

## 2012-12-07 DIAGNOSIS — M545 Low back pain, unspecified: Secondary | ICD-10-CM | POA: Insufficient documentation

## 2012-12-07 DIAGNOSIS — IMO0001 Reserved for inherently not codable concepts without codable children: Secondary | ICD-10-CM | POA: Insufficient documentation

## 2012-12-07 DIAGNOSIS — R5381 Other malaise: Secondary | ICD-10-CM | POA: Insufficient documentation

## 2012-12-07 DIAGNOSIS — M2550 Pain in unspecified joint: Secondary | ICD-10-CM | POA: Insufficient documentation

## 2012-12-13 ENCOUNTER — Ambulatory Visit: Payer: BC Managed Care – PPO | Admitting: Physical Therapy

## 2012-12-18 ENCOUNTER — Ambulatory Visit: Payer: BC Managed Care – PPO | Admitting: Physical Therapy

## 2012-12-20 ENCOUNTER — Ambulatory Visit: Payer: BC Managed Care – PPO | Admitting: Rehabilitation

## 2012-12-25 ENCOUNTER — Ambulatory Visit: Payer: BC Managed Care – PPO | Admitting: Physical Therapy

## 2012-12-27 ENCOUNTER — Ambulatory Visit: Payer: BC Managed Care – PPO | Admitting: Physical Therapy

## 2013-01-02 ENCOUNTER — Ambulatory Visit: Payer: BC Managed Care – PPO | Attending: Family Medicine | Admitting: Physical Therapy

## 2013-01-02 DIAGNOSIS — M545 Low back pain, unspecified: Secondary | ICD-10-CM | POA: Insufficient documentation

## 2013-01-02 DIAGNOSIS — M25569 Pain in unspecified knee: Secondary | ICD-10-CM | POA: Insufficient documentation

## 2013-01-02 DIAGNOSIS — R262 Difficulty in walking, not elsewhere classified: Secondary | ICD-10-CM | POA: Insufficient documentation

## 2013-01-02 DIAGNOSIS — R5381 Other malaise: Secondary | ICD-10-CM | POA: Insufficient documentation

## 2013-01-02 DIAGNOSIS — M2550 Pain in unspecified joint: Secondary | ICD-10-CM | POA: Insufficient documentation

## 2013-01-02 DIAGNOSIS — IMO0001 Reserved for inherently not codable concepts without codable children: Secondary | ICD-10-CM | POA: Insufficient documentation

## 2013-01-02 DIAGNOSIS — R269 Unspecified abnormalities of gait and mobility: Secondary | ICD-10-CM | POA: Insufficient documentation

## 2013-01-04 ENCOUNTER — Ambulatory Visit: Payer: BC Managed Care – PPO | Admitting: Rehabilitation

## 2013-01-05 ENCOUNTER — Ambulatory Visit (INDEPENDENT_AMBULATORY_CARE_PROVIDER_SITE_OTHER): Payer: BC Managed Care – PPO | Admitting: Family Medicine

## 2013-01-05 ENCOUNTER — Encounter: Payer: Self-pay | Admitting: Family Medicine

## 2013-01-05 VITALS — BP 134/82 | HR 79 | Wt 221.0 lb

## 2013-01-05 DIAGNOSIS — M1710 Unilateral primary osteoarthritis, unspecified knee: Secondary | ICD-10-CM

## 2013-01-05 DIAGNOSIS — M159 Polyosteoarthritis, unspecified: Secondary | ICD-10-CM

## 2013-01-05 DIAGNOSIS — M170 Bilateral primary osteoarthritis of knee: Secondary | ICD-10-CM

## 2013-01-05 NOTE — Progress Notes (Signed)
Pre-visit discussion using our clinic review tool. No additional management support is needed unless otherwise documented below in the visit note.  

## 2013-01-05 NOTE — Patient Instructions (Signed)
It is great to see you Wear the braces with activity For your foot try these new exercises  Continue your knee exercises 3 times a week and with ice.  Try Spenco orthotics.  They are about $30 dollars at [employer] [facility] or [facility] If they do not help make an appointment with me but tell them it is for orthotics.

## 2013-01-05 NOTE — Assessment & Plan Note (Signed)
Patient did have knee braces that were fitted by me today. Patient's will do over-the-counter medications that can be helpful. Encourage weight loss. Patient data pain we would consider doing custom orthotic. Patient is still a candidate to have steroid injections but because she is doing somewhat better she would like to decline at this time.

## 2013-01-05 NOTE — Assessment & Plan Note (Signed)
Patient does have severe osteoarthritis. Patient encouraged to try to do some of the over-the-counter medications and we discussed previously. Patient was fitted with new knee braces today which I think would be helpful. Patient will try these interventions and come back in 4-6 weeks. Patient continues to have some foot pain we'll consider doing is giving her custom orthotics which did help her knee pain. Encourage weight loss as well.

## 2013-01-05 NOTE — Progress Notes (Signed)
  CC: Bilateral knee pain  HPI: Patient is following up for her right knee pain. Patient has been found to have tricompartmental end-stage osteoarthritis of the knee . Patient has been doing conservative therapy with bracing, over-the-counter medications, and exercises.patient has been going to physical therapy and states that this has caused some improvement as well. Patient still states though with work been on a hard surface and picking up boxes this is been very difficult. Patient continues to accumulate with the aid of a cane.    Past medical, surgical, family and social history reviewed. Medications reviewed all in the electronic medical record.  Review of Systems: No headache, visual changes, nausea, vomiting, diarrhea, constipation, dizziness, abdominal pain, skin rash, fevers, chills, night sweats, weight loss, swollen lymph nodes, body aches, joint swelling, muscle aches, chest pain, shortness of breath, mood changes.   Objective:    Blood pressure 134/82, pulse 79, weight 221 lb (100.245 kg), SpO2 99.00%.   General: No apparent distress alert and oriented x3 mood and affect normal, dressed appropriately. Obese HEENT: Pupils equal, extraocular movements intact Respiratory: Patient's speak in full sentences and does not appear short of breath Cardiovascular: No lower extremity edema, non tender, no erythema Skin: Warm dry intact with no signs of infection or rash on extremities or on axial skeleton. Abdomen: Soft nontender Neuro: Cranial nerves II through XII are intact, neurovascularly intact in all extremities with 2+ DTRs and 2+ pulses. Lymph: No lymphadenopathy of posterior or anterior cervical chain or axillae bilaterally.  Gait antalgic gait and walks with a cane MSK: Non tender with full range of motion and good stability and symmetric strength and tone of shoulders, elbows, wrist, hip and ankles bilaterally. Ankles bilaterally show that patient does have dependent edema but has  full range of motion bilaterally with 5 out of 5 strength in neurovascularly intact. Knee: Right Normal to inspection with no erythema or effusion or obvious bony abnormalities. Palpation normal with no warmth, joint line tenderness, but mild patellar tenderness,ROM full in flexion and extension and lower leg rotation. Ligaments with solid consistent endpoints including ACL, PCL, LCL, MCL. Negative Mcmurray's, Apley's, and Thessalonian tests. Positive painful patellar compression. Patellar glide with significant crepitus. Patellar and quadriceps tendons unremarkable. Hamstring and quadriceps strength is normal.  Knee: Left Normal to inspection with no erythema or effusion or obvious bony abnormalities. Palpation shows patient is tender to palpation mostly over the medial joint line. ROM full in flexion and extension and lower leg rotation. Ligaments with solid consistent endpoints including ACL, PCL, LCL, MCL. Negative Mcmurray's, Apley's, and Thessalonian tests. Positive painful patellar compression. Patellar glide with crepitus. Patellar and quadriceps tendons unremarkable. Hamstring and quadriceps strength is normal.      Impression and Recommendations:     This case required medical decision making of moderate complexity.

## 2013-04-27 ENCOUNTER — Encounter: Payer: Self-pay | Admitting: Internal Medicine

## 2013-04-27 ENCOUNTER — Ambulatory Visit (INDEPENDENT_AMBULATORY_CARE_PROVIDER_SITE_OTHER): Payer: BC Managed Care – PPO | Admitting: Internal Medicine

## 2013-04-27 VITALS — BP 120/82 | HR 82 | Temp 98.0°F | Ht 63.0 in | Wt 228.0 lb

## 2013-04-27 DIAGNOSIS — E1140 Type 2 diabetes mellitus with diabetic neuropathy, unspecified: Secondary | ICD-10-CM

## 2013-04-27 DIAGNOSIS — E785 Hyperlipidemia, unspecified: Secondary | ICD-10-CM

## 2013-04-27 DIAGNOSIS — Z0000 Encounter for general adult medical examination without abnormal findings: Secondary | ICD-10-CM

## 2013-04-27 DIAGNOSIS — E1142 Type 2 diabetes mellitus with diabetic polyneuropathy: Secondary | ICD-10-CM

## 2013-04-27 DIAGNOSIS — E1149 Type 2 diabetes mellitus with other diabetic neurological complication: Secondary | ICD-10-CM

## 2013-04-27 MED ORDER — FUROSEMIDE 20 MG PO TABS: 10.0000 mg | ORAL_TABLET | Freq: Every day | ORAL | Status: DC

## 2013-04-27 MED ORDER — GLIMEPIRIDE 2 MG PO TABS: 2.0000 mg | ORAL_TABLET | Freq: Every day | ORAL | Status: DC

## 2013-04-27 MED ORDER — PRAVASTATIN SODIUM 80 MG PO TABS: 80.0000 mg | ORAL_TABLET | Freq: Every day | ORAL | Status: DC

## 2013-04-27 MED ORDER — OLMESARTAN MEDOXOMIL-HCTZ 40-25 MG PO TABS: 1.0000 | ORAL_TABLET | Freq: Every day | ORAL | Status: DC

## 2013-04-27 NOTE — Assessment & Plan Note (Signed)
stable overall by history and exam, recent data reviewed with pt, and pt to continue medical treatment as before,  to f/u any worsening symptoms or concerns' Lab Results  Component Value Date   HGBA1C 7.6* 10/27/2012

## 2013-04-27 NOTE — Progress Notes (Signed)
Pre visit review using our clinic review tool, if applicable. No additional management support is needed unless otherwise documented below in the visit note. 

## 2013-04-27 NOTE — Patient Instructions (Addendum)
OK to increase the pravastatin to 80 mg for the cholesterol  Please continue all other medications as before, and refills have been done if requested. Please have the pharmacy call with any other refills you may need.  Please continue your efforts at being more active, low cholesterol diet, and weight control. You are otherwise up to date with prevention measures today. Please keep your appointments with your specialists as you have planned  You will be contacted regarding the referral for: mammogram Please call if you change your mind about the colonoscopy  Your handicap parking form was signed  Please return in 6 months, or sooner if needed

## 2013-04-27 NOTE — Assessment & Plan Note (Signed)
OK to increase the pravastatin to 80 mg qd

## 2013-04-27 NOTE — Assessment & Plan Note (Signed)

## 2013-04-27 NOTE — Progress Notes (Signed)
Subjective:    Patient ID: Alice Hickman, female    DOB: [birth year], 63 y.o.   MRN: [id number]  HPI  Here for wellness and f/u;  Overall doing ok;  Pt denies CP, worsening SOB, DOE, wheezing, orthopnea, PND, worsening LE edema, palpitations, dizziness or syncope.  Pt denies neurological change such as new headache, facial or extremity weakness.  Pt denies polydipsia, polyuria, or low sugar symptoms. Pt states overall good compliance with treatment and medications, good tolerability, and has been trying to follow lower cholesterol diet.  Pt denies worsening depressive symptoms, suicidal ideation or panic. No fever, night sweats, wt loss, loss of appetite, or other constitutional symptoms.  Pt states good ability with ADL's, has low fall risk, home safety reviewed and adequate, no other significant changes in hearing or vision, and only occasionally active with exercise.  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  Walks with cane. Uses a buggy at work but still falls occas. Sees Dr Smith/sport med. Declines colonoscopy, willing to do mammogrma Past Medical History  Diagnosis Date  . DM w/o Complication Type II 17/61/6073  . HYPERLIPIDEMIA 05/01/2007  . HYPOKALEMIA 10/02/2008  . ANXIETY, SITUATIONAL 09/02/2009  . CARPAL TUNNEL SYNDROME, RIGHT 11/15/2006  . GLAUCOMA 07/04/2008  . HYPERTENSION 11/15/2006  . ALLERGIC RHINITIS 05/01/2007  . GERD 11/15/2006  . ANKLE PAIN, RIGHT 09/02/2009  . THUMB PAIN, RIGHT 04/02/2009  . OSTEOPENIA 09/02/2009  . Low back pain   . Generalized osteoarthritis 10/27/2012    bilat ankles, knees, hands and wrists   Past Surgical History  Procedure Laterality Date  . Tonsillectomy    . Right shoulder sugury      reports that she has never smoked. She does not have any smokeless tobacco history on file. She reports that she does not drink alcohol or use illicit drugs. family history includes Cancer in her brother and  other; Heart disease in her other; Hypertension in her other. Allergies  Allergen Reactions  . Lovastatin     REACTION: leg cramps  . Metformin     REACTION: GI upset  . Penicillins     REACTION: erythema multiforme 2001   Current Outpatient Prescriptions on File Prior to Visit  Medication Sig Dispense Refill  . aspirin 81 MG tablet Take 81 mg by mouth daily.        . fish oil-omega-3 fatty acids 1000 MG capsule Take 2 g by mouth daily.      . furosemide (LASIX) 20 MG tablet Take 0.5 tablets (10 mg total) by mouth daily.  30 tablet  3  . glimepiride (AMARYL) 2 MG tablet Take 1 tablet (2 mg total) by mouth daily before breakfast.  90 tablet  3  . HYDROcodone-acetaminophen (NORCO/VICODIN) 5-325 MG per tablet Take 1 tablet by mouth every 4 (four) hours as needed for pain.  30 tablet  0  . lidocaine (LIDODERM) 5 % Place 1 patch onto the skin 3 (three) times daily as needed. Remove & Discard patch within 12 hours or as directed by MD  90 patch  2  . olmesartan-hydrochlorothiazide (BENICAR HCT) 40-25 MG per tablet Take 1 tablet by mouth daily.  90 tablet  3  . pantoprazole (PROTONIX) 40 MG tablet Take 1 tablet (40 mg total) by mouth daily.  90 tablet  3  . pravastatin (PRAVACHOL) 40 MG tablet Take 1 tablet (40 mg total) by mouth daily.  90 tablet  3  No current facility-administered medications on file prior to visit.    Review of Systems  Constitutional: Negative for unexpected weight change, or unusual diaphoresis  HENT: Negative for tinnitus.   Eyes: Negative for photophobia and visual disturbance.  Respiratory: Negative for choking and stridor.   Gastrointestinal: Negative for vomiting and blood in stool.  Genitourinary: Negative for hematuria and decreased urine volume.  Musculoskeletal: Negative for acute joint swelling Skin: Negative for color change and wound.  Neurological: Negative for tremors and numbness other than noted  Psychiatric/Behavioral: Negative for decreased  concentration or  hyperactivity.       Objective:   Physical Exam BP 120/82  Pulse 82  Temp(Src) 98 F (36.7 C) (Oral)  Ht 5\' 3"  (1.6 m)  Wt 228 lb (103.42 kg)  BMI 40.40 kg/m2  SpO2 98% VS noted,  Constitutional: Pt is oriented to person, place, and time. Appears well-developed and well-nourished.  Head: Normocephalic and atraumatic.  Right Ear: External ear normal.  Left Ear: External ear normal.  Nose: Nose normal.  Mouth/Throat: Oropharynx is clear and moist.  Eyes: Conjunctivae and EOM are normal. Pupils are equal, round, and reactive to light.  Neck: Normal range of motion. Neck supple. No JVD present. No tracheal deviation present.  Cardiovascular: Normal rate, regular rhythm, normal heart sounds and intact distal pulses.   Pulmonary/Chest: Effort normal and breath sounds normal.  Abdominal: Soft. Bowel sounds are normal. There is no tenderness. No HSM  Musculoskeletal: Normal range of motion. Exhibits no edema.  Lymphadenopathy:  Has no cervical adenopathy.  Neurological: Pt is alert and oriented to person, place, and time. Pt has normal reflexes. No cranial nerve deficit.  Skin: Skin is warm and dry. No rash noted.  Psychiatric:  Has  normal mood and affect. Behavior is normal.  Right knee with bony deg changes    Assessment & Plan:

## 2013-05-01 ENCOUNTER — Encounter: Payer: Self-pay | Admitting: Family Medicine

## 2013-05-01 ENCOUNTER — Ambulatory Visit (INDEPENDENT_AMBULATORY_CARE_PROVIDER_SITE_OTHER): Payer: BC Managed Care – PPO | Admitting: Family Medicine

## 2013-05-01 VITALS — BP 144/92 | HR 84

## 2013-05-01 DIAGNOSIS — M2142 Flat foot [pes planus] (acquired), left foot: Principal | ICD-10-CM

## 2013-05-01 DIAGNOSIS — M2140 Flat foot [pes planus] (acquired), unspecified foot: Secondary | ICD-10-CM

## 2013-05-01 DIAGNOSIS — M2141 Flat foot [pes planus] (acquired), right foot: Secondary | ICD-10-CM

## 2013-05-01 NOTE — Assessment & Plan Note (Signed)
Patient was given orthotics today that her custom made for her today. Patient is to wear these slowly increase the amount of wear over the course of time. Patient can bring them in at any time for further evaluation. Patient will follow up again in 4 weeks measures she continues to do well. I also think these orthotics will help her knee pain tremendously.

## 2013-05-01 NOTE — Progress Notes (Signed)
  CC:  bilateral ankle swelling and foot pain followup  HPI: Patient has had bilateral ankle swelling for quite some time as well as bilateral ankle pain. Patient is also has known history of end-stage degenerative changes of the knees bilaterally. Patient states it is becoming more and more difficult to ambulate. Patient states that her knees are doing better then she was at her previous exam the continues with a lot of walking to have worsening swelling that seems to go down towards her ankles as well.  Past medical, surgical, family and social history reviewed. Medications reviewed all in the electronic medical record.  Review of Systems: No headache, visual changes, nausea, vomiting, diarrhea, constipation, dizziness, abdominal pain, skin rash, fevers, chills, night sweats, weight loss, swollen lymph nodes, body aches, joint swelling, muscle aches, chest pain, shortness of breath, mood changes.   Objective:    Blood pressure 144/92, pulse 84, SpO2 96.00%.   General: No apparent distress alert and oriented x3 mood and affect normal, dressed appropriately. Obese HEENT: Pupils equal, extraocular movements intact Respiratory: Patient's speak in full sentences and does not appear short of breath Cardiovascular: No lower extremity edema, non tender, no erythema Skin: Warm dry intact with no signs of infection or rash on extremities or on axial skeleton. Abdomen: Soft nontender Neuro: Cranial nerves II through XII are intact, neurovascularly intact in all extremities with 2+ DTRs and 2+ pulses. Lymph: No lymphadenopathy of posterior or anterior cervical chain or axillae bilaterally.  Gait antalgic gait and walks with a cane MSK: Non tender with full range of motion and good stability and symmetric strength and tone of shoulders, elbows, wrist, hip, knee and ankles bilaterally. Ankles bilaterally show that patient does have dependent edema but has full range of motion bilaterally with 5 out of 5  strength in neurovascularly intact. Knee: Right Normal to inspection with no erythema or effusion or obvious bony abnormalities. Palpation normal with no warmth, joint line tenderness, but mild patellar tenderness,ROM full in flexion and extension and lower leg rotation. Ligaments with solid consistent endpoints including ACL, PCL, LCL, MCL. Negative Mcmurray's, Apley's, and Thessalonian tests. Positive painful patellar compression. Patellar glide with significant crepitus. Patellar and quadriceps tendons unremarkable. Hamstring and quadriceps strength is normal.  Foot exam shows the patient does have pes planus bilaterally with over pronation of the hindfoot. Mild plane between the first and second toes bilaterally right greater than left.   Patient was fitted for a : standard, cushioned, semi-rigid orthotic. The orthotic was heated and afterward the patient stood on the orthotic blank positioned on the orthotic stand. The patient was positioned in subtalar neutral position and 10 degrees of ankle dorsiflexion in a weight bearing stance. After completion of molding, a stable base was applied to the orthotic blank. The blank was ground to a stable position for weight bearing. Size: 10 Base: [employer] Additional Posting and Padding: None The patient ambulated these, and they were very comfortable.  I spent 45 minutes with this patient, greater than 50% was face-to-face time counseling regarding the below diagnosis.    Impression and Recommendations:

## 2013-05-01 NOTE — Patient Instructions (Signed)
Good to see you When you get the orthotics wear them at first for 4 hours and increase 1 hour a day.  Take the other insole out of the shoe as well.  If not perfect then come back and we can adjust.  See me when you need me.

## 2013-05-11 ENCOUNTER — Other Ambulatory Visit: Payer: Self-pay | Admitting: Internal Medicine

## 2013-05-16 ENCOUNTER — Ambulatory Visit
Admission: RE | Admit: 2013-05-16 | Discharge: 2013-05-16 | Disposition: A | Discharge status: Home / Self Care | Payer: BC Managed Care – PPO | Source: Ambulatory Visit | Attending: Internal Medicine | Admitting: Internal Medicine

## 2013-05-16 DIAGNOSIS — Z0000 Encounter for general adult medical examination without abnormal findings: Secondary | ICD-10-CM

## 2013-05-18 ENCOUNTER — Other Ambulatory Visit: Payer: Self-pay | Admitting: Internal Medicine

## 2013-05-18 DIAGNOSIS — R928 Other abnormal and inconclusive findings on diagnostic imaging of breast: Secondary | ICD-10-CM

## 2013-05-21 ENCOUNTER — Other Ambulatory Visit: Payer: Self-pay | Admitting: Internal Medicine

## 2013-05-24 ENCOUNTER — Telehealth: Payer: Self-pay

## 2013-05-24 NOTE — Telephone Encounter (Signed)
Relevant patient education mailed to patient.  

## 2013-06-01 ENCOUNTER — Ambulatory Visit
Admission: RE | Admit: 2013-06-01 | Discharge: 2013-06-01 | Disposition: A | Discharge status: Home / Self Care | Payer: BC Managed Care – PPO | Source: Ambulatory Visit | Attending: Internal Medicine | Admitting: Internal Medicine

## 2013-06-01 DIAGNOSIS — R928 Other abnormal and inconclusive findings on diagnostic imaging of breast: Secondary | ICD-10-CM

## 2013-06-01 LAB — HM MAMMOGRAPHY

## 2013-08-07 ENCOUNTER — Telehealth: Payer: Self-pay | Admitting: *Deleted

## 2013-08-07 DIAGNOSIS — E1140 Type 2 diabetes mellitus with diabetic neuropathy, unspecified: Secondary | ICD-10-CM

## 2013-08-07 NOTE — Telephone Encounter (Signed)
Patient already has an appointment scheduled A1c, bmet ordered. Diabetic bundle

## 2013-10-26 ENCOUNTER — Other Ambulatory Visit (INDEPENDENT_AMBULATORY_CARE_PROVIDER_SITE_OTHER): Payer: BC Managed Care – PPO

## 2013-10-26 DIAGNOSIS — E1140 Type 2 diabetes mellitus with diabetic neuropathy, unspecified: Secondary | ICD-10-CM

## 2013-10-26 DIAGNOSIS — E1149 Type 2 diabetes mellitus with other diabetic neurological complication: Secondary | ICD-10-CM

## 2013-10-26 DIAGNOSIS — E1142 Type 2 diabetes mellitus with diabetic polyneuropathy: Secondary | ICD-10-CM

## 2013-10-26 LAB — HEMOGLOBIN A1C: Hgb A1c MFr Bld: 7 % — ABNORMAL HIGH (ref 4.6–6.5)

## 2013-10-26 LAB — BASIC METABOLIC PANEL
BUN: 12 mg/dL (ref 6–23)
CALCIUM: 9.5 mg/dL (ref 8.4–10.5)
CO2: 28 meq/L (ref 19–32)
Chloride: 103 mEq/L (ref 96–112)
Creatinine, Ser: 0.9 mg/dL (ref 0.4–1.2)
GFR: 87.34 mL/min (ref >60.00)
GLUCOSE: 135 mg/dL — AB (ref 70–99)
Potassium: 3.3 mEq/L — ABNORMAL LOW (ref 3.5–5.1)
SODIUM: 137 meq/L (ref 135–145)

## 2013-10-30 ENCOUNTER — Ambulatory Visit: Payer: BC Managed Care – PPO | Admitting: Internal Medicine

## 2013-11-06 ENCOUNTER — Encounter: Payer: Self-pay | Admitting: Internal Medicine

## 2013-11-06 ENCOUNTER — Ambulatory Visit (INDEPENDENT_AMBULATORY_CARE_PROVIDER_SITE_OTHER): Payer: BC Managed Care – PPO | Admitting: Internal Medicine

## 2013-11-06 ENCOUNTER — Telehealth: Payer: Self-pay | Admitting: Internal Medicine

## 2013-11-06 VITALS — BP 128/70 | HR 86 | Temp 98.2°F | Wt 223.1 lb

## 2013-11-06 DIAGNOSIS — M170 Bilateral primary osteoarthritis of knee: Secondary | ICD-10-CM

## 2013-11-06 DIAGNOSIS — M2141 Flat foot [pes planus] (acquired), right foot: Secondary | ICD-10-CM

## 2013-11-06 DIAGNOSIS — Z23 Encounter for immunization: Secondary | ICD-10-CM

## 2013-11-06 DIAGNOSIS — M79672 Pain in left foot: Secondary | ICD-10-CM

## 2013-11-06 DIAGNOSIS — M79671 Pain in right foot: Secondary | ICD-10-CM

## 2013-11-06 DIAGNOSIS — E1140 Type 2 diabetes mellitus with diabetic neuropathy, unspecified: Secondary | ICD-10-CM

## 2013-11-06 DIAGNOSIS — E119 Type 2 diabetes mellitus without complications: Secondary | ICD-10-CM

## 2013-11-06 DIAGNOSIS — M2142 Flat foot [pes planus] (acquired), left foot: Secondary | ICD-10-CM

## 2013-11-06 DIAGNOSIS — Z0189 Encounter for other specified special examinations: Secondary | ICD-10-CM

## 2013-11-06 DIAGNOSIS — Z0000 Encounter for general adult medical examination without abnormal findings: Secondary | ICD-10-CM

## 2013-11-06 DIAGNOSIS — I10 Essential (primary) hypertension: Secondary | ICD-10-CM

## 2013-11-06 MED ORDER — CELECOXIB 200 MG PO CAPS: 200.0000 mg | ORAL_CAPSULE | Freq: Every day | ORAL | Status: DC | PRN

## 2013-11-06 NOTE — Progress Notes (Signed)
Subjective:    Patient ID: Alice Hickman, female    DOB: [birth year], 63 y.o.   MRN: [id number]  HPI  Here to f/u; overall doing ok,  Pt denies chest pain, increased sob or doe, wheezing, orthopnea, PND, increased LE swelling, palpitations, dizziness or syncope.  Pt denies polydipsia, polyuria, or low sugar symptoms such as weakness or confusion improved with po intake.  Pt denies new neurological symptoms such as new headache, or facial or extremity weakness or numbness.   Pt states overall good compliance with meds, has been trying to follow lower cholesterol, diabetic diet, with wt overall stable,  but little exercise however.   Pt c/o left lower back pain recurring LBP without change in severity, bowel or bladder change, fever, wt loss,  worsening LE numbness/weakness though has recurrent pain sometimes to left knee., but no gait change or falls.  Thought it might be shingles coming out, but no rash occurred.  Resolved after 2 wks. Wondering about getting the shingles shot. 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  Also with pain to both balls of feet starting about 4 hrs into the shift, has to hold onto a buggy to complete the hours. Has hx of pes planus at last mar 2015 visit with sport med. Has not made f/u. Tearful today recounting the pain, ongoing for > 2 mo now mod to severe  Wondering about getting an FMLA  Past Medical History  Diagnosis Date  . DM w/o Complication Type II 37/62/8315  . HYPERLIPIDEMIA 05/01/2007  . HYPOKALEMIA 10/02/2008  . ANXIETY, SITUATIONAL 09/02/2009  . CARPAL TUNNEL SYNDROME, RIGHT 11/15/2006  . GLAUCOMA 07/04/2008  . HYPERTENSION 11/15/2006  . ALLERGIC RHINITIS 05/01/2007  . GERD 11/15/2006  . ANKLE PAIN, RIGHT 09/02/2009  . THUMB PAIN, RIGHT 04/02/2009  . OSTEOPENIA 09/02/2009  . Low back pain   . Generalized osteoarthritis 10/27/2012    bilat ankles, knees,  hands and wrists   Past Surgical History  Procedure Laterality Date  . Tonsillectomy    . Right shoulder sugury      reports that she has never smoked. She does not have any smokeless tobacco history on file. She reports that she does not drink alcohol or use illicit drugs. family history includes Cancer in her brother and other; Heart disease in her other; Hypertension in her other. Allergies  Allergen Reactions  . Lovastatin     REACTION: leg cramps  . Metformin     REACTION: GI upset  . Penicillins     REACTION: erythema multiforme 2001   Current Outpatient Prescriptions on File Prior to Visit  Medication Sig Dispense Refill  . aspirin 81 MG tablet Take 81 mg by mouth daily.        . fish oil-omega-3 fatty acids 1000 MG capsule Take 2 g by mouth daily.      . furosemide (LASIX) 20 MG tablet Take 0.5 tablets (10 mg total) by mouth daily.  90 tablet  3  . glimepiride (AMARYL) 2 MG tablet Take 1 tablet (2 mg total) by mouth daily before breakfast.  90 tablet  3  . HYDROcodone-acetaminophen (NORCO/VICODIN) 5-325 MG per tablet Take 1 tablet by mouth every 4 (four) hours as needed for pain.  30 tablet  0  . lidocaine (LIDODERM) 5 % Place 1 patch onto the skin 3 (three) times daily as needed. Remove & Discard  patch within 12 hours or as directed by MD  90 patch  2  . olmesartan-hydrochlorothiazide (BENICAR HCT) 40-25 MG per tablet Take 1 tablet by mouth daily.  90 tablet  3  . pantoprazole (PROTONIX) 40 MG tablet TAKE ONE TABLET BY MOUTH ONCE DAILY  90 tablet  3  . pravastatin (PRAVACHOL) 80 MG tablet Take 1 tablet (80 mg total) by mouth daily.  90 tablet  3   No current facility-administered medications on file prior to visit.   Review of Systems  Constitutional: Negative for unusual diaphoresis or other sweats  HENT: Negative for ringing in ear Eyes: Negative for double vision or worsening visual disturbance.  Respiratory: Negative for choking and stridor.   Gastrointestinal:  Negative for vomiting or other signifcant bowel change Genitourinary: Negative for hematuria or decreased urine volume.  Musculoskeletal: Negative for other MSK pain or swelling Skin: Negative for color change and worsening wound.  Neurological: Negative for tremors and numbness other than noted  Psychiatric/Behavioral: Negative for decreased concentration or agitation other than above       Objective:   Physical Exam BP 128/70  Pulse 86  Temp(Src) 98.2 F (36.8 C) (Oral)  Wt 223 lb 2 oz (101.209 kg)  SpO2 99% VS noted, walks with cane Constitutional: Pt appears well-developed, well-nourished.  HENT: Head: NCAT.  Right Ear: External ear normal.  Left Ear: External ear normal.  Eyes: . Pupils are equal, round, and reactive to light. Conjunctivae and EOM are normal Neck: Normal range of motion. Neck supple.  Cardiovascular: Normal rate and regular rhythm.   Pulmonary/Chest: Effort normal and breath sounds normal.  Abd:  Soft, NT, ND, + BS Spine nontender Knees with crepitus bilat, no effusio Neurological: Pt is alert. Not confused , motor grossly intact Skin: Skin is warm. No rash Psychiatric: Pt behavior is normal. No agitation.  Spine nontender Bilat balls of feet tender, without swelling, ulceration    Assessment & Plan:

## 2013-11-06 NOTE — Patient Instructions (Addendum)
Please take all new medication as prescribed  - the celebrex antiinflammatory for pain  You will be contacted regarding the referral for: podiatry  Please continue wearing well cushioned shoes with soft inserts  OK to let us know if the protonix is more expensive now under your insurance, as this could be changed (but try to find out from pharmacy which one might be covered with your plan)  Please continue all other medications as before, and refills have been done if requested.  Please have the pharmacy call with any other refills you may need.  Please continue your efforts at being more active, low cholesterol  Diabetic diet, and weight control.  Please keep your appointments with your specialists as you may have planned  Please call [employer] to see if the shingles shot is covered, and if so to make a Nurse Visit Appt to have this done.  Please return in 6 months, or sooner if needed, with Lab testing done 3-5 days before

## 2013-11-06 NOTE — Progress Notes (Signed)
Pre visit review using our clinic review tool, if applicable. No additional management support is needed unless otherwise documented below in the visit note. 

## 2013-11-06 NOTE — Telephone Encounter (Signed)
Pt called in said that she was in today and Dr Jonny RuizJohn gave her celecoxib (CELEBREX) 200 MG capsule [[id number]][[id number]] and her ins will not pay any of it.  She wanted to know if there is anything else she can take instead of this.

## 2013-11-07 MED ORDER — MELOXICAM 15 MG PO TABS: 15.0000 mg | ORAL_TABLET | Freq: Every day | ORAL | Status: DC

## 2013-11-07 NOTE — Telephone Encounter (Signed)
Patient informed. 

## 2013-11-07 NOTE — Telephone Encounter (Signed)
Ok to change to meloxicam 15 qd prn, done erx

## 2013-11-12 NOTE — Assessment & Plan Note (Addendum)
Pt with worsening pain, not clear how well she followed sport med tx, for celebrex prn, soft soled shoes, refer podiatry, consider FMLA if persists

## 2013-11-12 NOTE — Assessment & Plan Note (Signed)
Pt cont;s to defer surgury with [facility] [facility], will consider if worsens

## 2013-11-12 NOTE — Assessment & Plan Note (Signed)
stable overall by history and exam, recent data reviewed with pt, and pt to continue medical treatment as before,  to f/u any worsening symptoms or concerns BP Readings from Last 3 Encounters:  11/06/13 128/70  05/01/13 144/92  04/27/13 120/82

## 2013-11-12 NOTE — Assessment & Plan Note (Signed)
stable overall by history and exam, recent data reviewed with pt, and pt to continue medical treatment as before,  to f/u any worsening symptoms or concerns Lab Results  Component Value Date   HGBA1C 7.0* 10/26/2013

## 2013-11-15 ENCOUNTER — Telehealth: Payer: Self-pay | Admitting: *Deleted

## 2013-11-15 ENCOUNTER — Ambulatory Visit (INDEPENDENT_AMBULATORY_CARE_PROVIDER_SITE_OTHER): Payer: BC Managed Care – PPO

## 2013-11-15 ENCOUNTER — Ambulatory Visit (INDEPENDENT_AMBULATORY_CARE_PROVIDER_SITE_OTHER): Payer: BC Managed Care – PPO | Admitting: Podiatry

## 2013-11-15 ENCOUNTER — Encounter: Payer: Self-pay | Admitting: Podiatry

## 2013-11-15 VITALS — BP 122/70 | HR 86 | Resp 18 | Ht 63.0 in | Wt 223.0 lb

## 2013-11-15 DIAGNOSIS — G629 Polyneuropathy, unspecified: Secondary | ICD-10-CM

## 2013-11-15 DIAGNOSIS — E1142 Type 2 diabetes mellitus with diabetic polyneuropathy: Secondary | ICD-10-CM

## 2013-11-15 MED ORDER — PREGABALIN 50 MG PO CAPS: 50.0000 mg | ORAL_CAPSULE | Freq: Three times a day (TID) | ORAL | Status: DC

## 2013-11-15 NOTE — Progress Notes (Signed)
   Subjective:    Patient ID: Alice Hickman, female    DOB: 09/19/1950, 63 y.o.   MRN: [id number]  HPI Comments: Burning in my feet that go to the toes and it goes numb. It has been going on for two months feels like something stinging under my toes . Blood sugars have been under control. Does not know last a1c and has not taking her blood sugar due to not feeling well with head cold     Review of Systems  Endocrine:       Diabetes        Objective:   Physical Exam: I have reviewed her past medical history medications allergies surgeries social history and review of systems. Pulses are strongly palpable bilateral. Neurologic sensorium is decreased per Semmes-Weinstein monofilament at the level of the toes. Muscle strength is 5 over 5 dorsiflexors plantar flexors inverters everters all intrinsic musculature is intact. Deep tendon reflexes are intact and equal bilateral. Orthopedic evaluation demonstrates all joints distal ankle range of motion without crepitus mild HAV from hammertoe deformities are noted bilateral but asymptomatic. Cutaneous evaluation demonstrates supple well hydrated cutis no erythema edema saline is drainage or odor.        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  Plan: At this point: Neuropathy is quite limiting we are going to start Lyrica therapy. She will start Lyrica 50 mg 1 by mouth 3 times a day x90 with 3 refills will followup with her in one month for reevaluation.

## 2013-11-15 NOTE — Telephone Encounter (Signed)
I attempted to see if I could call the prescription into the pharmacy but could never get a person.  I then called and informed the patient that Dr. Al CorpusHyatt wrote a prescription for Lyrica but it cannot be called in to the pharmacy.  You will have to come by to pick it up.  She stated she would be right over.

## 2013-11-22 DIAGNOSIS — Z0279 Encounter for issue of other medical certificate: Secondary | ICD-10-CM

## 2013-12-13 ENCOUNTER — Encounter: Payer: Self-pay | Admitting: Podiatry

## 2013-12-13 ENCOUNTER — Ambulatory Visit (INDEPENDENT_AMBULATORY_CARE_PROVIDER_SITE_OTHER): Payer: BC Managed Care – PPO | Admitting: Podiatry

## 2013-12-13 VITALS — BP 163/78 | HR 75 | Resp 16

## 2013-12-13 DIAGNOSIS — E1142 Type 2 diabetes mellitus with diabetic polyneuropathy: Secondary | ICD-10-CM

## 2013-12-13 DIAGNOSIS — Z79899 Other long term (current) drug therapy: Secondary | ICD-10-CM

## 2013-12-13 NOTE — Progress Notes (Signed)
Pt presents for follow up on neuropathic pain, states that the medication is helping, she no longer has the pins/needles and tingling sensation in her feet. She does however c/o ongoing bilateral heel pain.  Objective: Vital signs are stable alert and oriented 3. Pulses are palpable. No change in neurologic findings.  Assessment: Peripheral neuropathy bilateral.  Plan: Continue current medications for the next 2 months will follow up with her at that time consider increasing medications at that point. Should she start to have any side effects she will notify seemingly.

## 2014-02-12 ENCOUNTER — Ambulatory Visit (INDEPENDENT_AMBULATORY_CARE_PROVIDER_SITE_OTHER): Payer: BLUE CROSS/BLUE SHIELD | Admitting: Podiatry

## 2014-02-12 VITALS — BP 124/70 | HR 81 | Resp 18

## 2014-02-12 DIAGNOSIS — E1142 Type 2 diabetes mellitus with diabetic polyneuropathy: Secondary | ICD-10-CM

## 2014-02-12 DIAGNOSIS — G629 Polyneuropathy, unspecified: Secondary | ICD-10-CM

## 2014-02-12 MED ORDER — PREGABALIN 75 MG PO CAPS: 75.0000 mg | ORAL_CAPSULE | Freq: Three times a day (TID) | ORAL | Status: DC

## 2014-02-12 NOTE — Progress Notes (Signed)
She presents today for follow-up of her neuropathy. She is currently taking Lyrica 50 mg 3 times a day. She states that she is doing much better though they're still painful.  Objective: Vital signs are stable she is alert and oriented 3. Pulses are palpable no changes in physical findings.  Assessment: Neuropathy bilateral.  Plan: Increase her Lyrica to 75 mg 3 times a day I will follow-up with her in 2 months.

## 2014-04-16 ENCOUNTER — Ambulatory Visit (INDEPENDENT_AMBULATORY_CARE_PROVIDER_SITE_OTHER): Payer: BLUE CROSS/BLUE SHIELD | Admitting: Podiatry

## 2014-04-16 ENCOUNTER — Encounter: Payer: Self-pay | Admitting: Podiatry

## 2014-04-16 VITALS — BP 128/73 | HR 77 | Resp 16

## 2014-04-16 DIAGNOSIS — G629 Polyneuropathy, unspecified: Secondary | ICD-10-CM

## 2014-04-16 MED ORDER — PREGABALIN 75 MG PO CAPS: 75.0000 mg | ORAL_CAPSULE | Freq: Three times a day (TID) | ORAL | Status: DC

## 2014-04-16 NOTE — Progress Notes (Signed)
She presents today for follow-up of her Lyrica. She states that her feet are proximally 50% better. She states however she was only taking 50 mg 3 times a day and we wrote a prescription of 75 mg. For some reason this was not changed at the pharmacy.  Objective: Vital signs are stable alert and oriented 3. Pulses are palpable no change in neuropathic findings.  Assessment: Peripheral neuropathy.  Plan: Increased her Lyrica to 75 mg 1 by mouth 3 times a day and I will follow-up with her 1 month.

## 2014-05-03 ENCOUNTER — Other Ambulatory Visit (INDEPENDENT_AMBULATORY_CARE_PROVIDER_SITE_OTHER): Payer: Self-pay

## 2014-05-03 DIAGNOSIS — E119 Type 2 diabetes mellitus without complications: Secondary | ICD-10-CM

## 2014-05-03 DIAGNOSIS — Z0189 Encounter for other specified special examinations: Secondary | ICD-10-CM

## 2014-05-03 DIAGNOSIS — Z0000 Encounter for general adult medical examination without abnormal findings: Secondary | ICD-10-CM

## 2014-05-03 LAB — URINALYSIS, ROUTINE W REFLEX MICROSCOPIC
BILIRUBIN URINE: NEGATIVE
HGB URINE DIPSTICK: NEGATIVE
Ketones, ur: NEGATIVE
Leukocytes, UA: NEGATIVE
Nitrite: NEGATIVE
Specific Gravity, Urine: 1.02 (ref 1.000–1.030)
Total Protein, Urine: NEGATIVE
URINE GLUCOSE: NEGATIVE
Urobilinogen, UA: 0.2 (ref 0.0–1.0)
pH: 6 (ref 5.0–8.0)

## 2014-05-03 LAB — CBC WITH DIFFERENTIAL/PLATELET
BASOS ABS: 0 10*3/uL (ref 0.0–0.1)
Basophils Relative: 0.4 % (ref 0.0–3.0)
EOS PCT: 7.7 % — AB (ref 0.0–5.0)
Eosinophils Absolute: 0.4 10*3/uL (ref 0.0–0.7)
HCT: 42.1 % (ref 36.0–46.0)
Hemoglobin: 13.7 g/dL (ref 12.0–15.0)
LYMPHS PCT: 44.2 % (ref 12.0–46.0)
Lymphs Abs: 2.3 10*3/uL (ref 0.7–4.0)
MCHC: 32.5 g/dL (ref 30.0–36.0)
MCV: 83.9 fl (ref 78.0–100.0)
MONOS PCT: 6.6 % (ref 3.0–12.0)
Monocytes Absolute: 0.3 10*3/uL (ref 0.1–1.0)
NEUTROS PCT: 41.1 % — AB (ref 43.0–77.0)
Neutro Abs: 2.2 10*3/uL (ref 1.4–7.7)
PLATELETS: 212 10*3/uL (ref 150.0–400.0)
RBC: 5.02 Mil/uL (ref 3.87–5.11)
RDW: 14.3 % (ref 11.5–15.5)
WBC: 5.2 10*3/uL (ref 4.0–10.5)

## 2014-05-03 LAB — BASIC METABOLIC PANEL
BUN: 9 mg/dL (ref 6–23)
CHLORIDE: 103 meq/L (ref 96–112)
CO2: 33 meq/L — AB (ref 19–32)
Calcium: 9.9 mg/dL (ref 8.4–10.5)
Creatinine, Ser: 0.77 mg/dL (ref 0.40–1.20)
GFR: 97.72 mL/min (ref >60.00)
Glucose, Bld: 89 mg/dL (ref 70–99)
POTASSIUM: 3.3 meq/L — AB (ref 3.5–5.1)
Sodium: 140 mEq/L (ref 135–145)

## 2014-05-03 LAB — HEPATIC FUNCTION PANEL
ALBUMIN: 3.6 g/dL (ref 3.5–5.2)
ALK PHOS: 59 U/L (ref 39–117)
ALT: 25 U/L (ref 0–35)
AST: 18 U/L (ref 0–37)
Bilirubin, Direct: 0.1 mg/dL (ref 0.0–0.3)
TOTAL PROTEIN: 7.3 g/dL (ref 6.0–8.3)
Total Bilirubin: 0.3 mg/dL (ref 0.2–1.2)

## 2014-05-03 LAB — LIPID PANEL
CHOL/HDL RATIO: 4
CHOLESTEROL: 187 mg/dL (ref 0–200)
HDL: 49.3 mg/dL (ref >39.00)
LDL CALC: 116 mg/dL — AB (ref 0–99)
NonHDL: 137.7
Triglycerides: 107 mg/dL (ref 0.0–149.0)
VLDL: 21.4 mg/dL (ref 0.0–40.0)

## 2014-05-03 LAB — MICROALBUMIN / CREATININE URINE RATIO
CREATININE, U: 160.8 mg/dL
MICROALB/CREAT RATIO: 0.4 mg/g (ref 0.0–30.0)
Microalb, Ur: <0.7 mg/dL (ref 0.0–1.9)

## 2014-05-03 LAB — HEMOGLOBIN A1C: Hgb A1c MFr Bld: 7 % — ABNORMAL HIGH (ref 4.6–6.5)

## 2014-05-03 LAB — TSH: TSH: 1.37 u[IU]/mL (ref 0.35–4.50)

## 2014-05-08 ENCOUNTER — Encounter: Payer: Self-pay | Admitting: Internal Medicine

## 2014-05-08 ENCOUNTER — Ambulatory Visit (INDEPENDENT_AMBULATORY_CARE_PROVIDER_SITE_OTHER): Payer: BLUE CROSS/BLUE SHIELD | Admitting: Internal Medicine

## 2014-05-08 VITALS — BP 128/86 | HR 68 | Temp 98.7°F | Resp 18 | Ht 63.0 in | Wt 225.1 lb

## 2014-05-08 DIAGNOSIS — E876 Hypokalemia: Secondary | ICD-10-CM

## 2014-05-08 DIAGNOSIS — E785 Hyperlipidemia, unspecified: Secondary | ICD-10-CM | POA: Diagnosis not present

## 2014-05-08 DIAGNOSIS — Z23 Encounter for immunization: Secondary | ICD-10-CM

## 2014-05-08 DIAGNOSIS — Z0000 Encounter for general adult medical examination without abnormal findings: Secondary | ICD-10-CM

## 2014-05-08 DIAGNOSIS — E1140 Type 2 diabetes mellitus with diabetic neuropathy, unspecified: Secondary | ICD-10-CM

## 2014-05-08 MED ORDER — POTASSIUM CHLORIDE ER 10 MEQ PO TBCR: 10.0000 meq | EXTENDED_RELEASE_TABLET | Freq: Every day | ORAL | Status: DC

## 2014-05-08 MED ORDER — PRAVASTATIN SODIUM 80 MG PO TABS: 80.0000 mg | ORAL_TABLET | Freq: Every day | ORAL | Status: DC

## 2014-05-08 NOTE — Assessment & Plan Note (Signed)
Pt states she is not taking the statin but is unsure, will refill and cont med as is for now, to work on diet, goal ldl < 70 Lab Results  Component Value Date   LDLCALC 116* 05/03/2014

## 2014-05-08 NOTE — Progress Notes (Signed)
Subjective:    Patient ID: Alice Hickman, female    DOB: 1950-09-15, 64 y.o.   MRN: [id number]  HPI  Here for wellness and f/u;  Overall doing ok;  Pt denies Chest pain, worsening SOB, DOE, wheezing, orthopnea, PND, worsening LE edema, palpitations, dizziness or syncope.  Pt denies neurological change such as new headache, facial or extremity weakness.  Pt denies polydipsia, polyuria, or low sugar symptoms. Pt states overall good compliance with treatment and medications, good tolerability, and has been trying to follow appropriate diet.  Pt denies worsening depressive symptoms, suicidal ideation or panic. No fever, night sweats, wt loss, loss of appetite, or other constitutional symptoms.  Pt states good ability with ADL's, has low fall risk, home safety reviewed and adequate, no other significant changes in hearing or vision, and only occasionally active with exercise. Walks with cane, has known periph neuropathy, helped some with lyrica per pt. Not taking the statin, but willing to restart.  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 DM w/o Complication Type II 36/64/4034  . HYPERLIPIDEMIA 05/01/2007  . HYPOKALEMIA 10/02/2008  . ANXIETY, SITUATIONAL 09/02/2009  . CARPAL TUNNEL SYNDROME, RIGHT 11/15/2006  . GLAUCOMA 07/04/2008  . HYPERTENSION 11/15/2006  . ALLERGIC RHINITIS 05/01/2007  . GERD 11/15/2006  . ANKLE PAIN, RIGHT 09/02/2009  . THUMB PAIN, RIGHT 04/02/2009  . OSTEOPENIA 09/02/2009  . Low back pain   . Generalized osteoarthritis 10/27/2012    bilat ankles, knees, hands and wrists   Past Surgical History  Procedure Laterality Date  . Tonsillectomy    . Right shoulder sugury      reports that she has never smoked. She does not have any smokeless tobacco history on file. She  reports that she does not drink alcohol or use illicit drugs. family history includes Cancer in her brother and other; Heart disease in her other; Hypertension in her other. Allergies  Allergen Reactions  . Lovastatin     REACTION: leg cramps  . Metformin     REACTION: GI upset  . Penicillins     REACTION: erythema multiforme 2001   Current Outpatient Prescriptions on File Prior to Visit  Medication Sig Dispense Refill  . aspirin 81 MG tablet Take 81 mg by mouth daily.      . fish oil-omega-3 fatty acids 1000 MG capsule Take 2 g by mouth daily.    Marland Kitchen glimepiride (AMARYL) 2 MG tablet Take 1 tablet (2 mg total) by mouth daily before breakfast. 90 tablet 3  . HYDROcodone-acetaminophen (NORCO/VICODIN) 5-325 MG per tablet Take 1 tablet by mouth every 4 (four) hours as needed for pain. 30 tablet 0  . lidocaine (LIDODERM) 5 % Place 1 patch onto the skin 3 (three) times daily as needed. Remove & Discard patch within 12 hours or as directed by MD 90 patch 2  . meloxicam (MOBIC) 15 MG tablet Take 1 tablet (15 mg total) by mouth daily. 30 tablet 5  . olmesartan-hydrochlorothiazide (BENICAR HCT) 40-25 MG per tablet Take 1 tablet by mouth daily. 90 tablet 3  . pantoprazole (PROTONIX) 40 MG tablet TAKE ONE TABLET BY MOUTH ONCE DAILY 90 tablet 3  . pravastatin (PRAVACHOL) 80 MG tablet Take 1 tablet (80 mg total) by mouth  daily. 90 tablet 3  . pregabalin (LYRICA) 75 MG capsule Take 1 capsule (75 mg total) by mouth 3 (three) times daily. 90 capsule 5  . pregabalin (LYRICA) 75 MG capsule Take 1 capsule (75 mg total) by mouth 3 (three) times daily. 90 capsule 5   No current facility-administered medications on file prior to visit.   Review of Systems Constitutional: Negative for increased diaphoresis, other activity, appetite or siginficant weight change other than noted HENT: Negative for worsening hearing loss, ear pain, facial swelling, mouth sores and neck stiffness.   Eyes: Negative for other  worsening pain, redness or visual disturbance.  Respiratory: Negative for shortness of breath and wheezing  Cardiovascular: Negative for chest pain and palpitations.  Gastrointestinal: Negative for diarrhea, blood in stool, abdominal distention or other pain Genitourinary: Negative for hematuria, flank pain or change in urine volume.  Musculoskeletal: Negative for myalgias or other joint complaints.  Skin: Negative for color change and wound or drainage.  Neurological: Negative for syncope and numbness. other than noted Hematological: Negative for adenopathy. or other swelling Psychiatric/Behavioral: Negative for hallucinations, SI, self-injury, decreased concentration or other worsening agitation.      Objective:   Physical Exam BP 128/86 mmHg  Pulse 68  Temp(Src) 98.7 F (37.1 C) (Oral)  Resp 18  Ht 5\' 3"  (1.6 m)  Wt 225 lb 1.3 oz (102.096 kg)  BMI 39.88 kg/m2  SpO2 97% VS noted,  Constitutional: Pt is oriented to person, place, and time. Appears well-developed and well-nourished, in no significant distress/obese Head: Normocephalic and atraumatic.  Right Ear: External ear normal.  Left Ear: External ear normal.  Nose: Nose normal.  Mouth/Throat: Oropharynx is clear and moist.  Eyes: Conjunctivae and EOM are normal. Pupils are equal, round, and reactive to light.  Neck: Normal range of motion. Neck supple. No JVD present. No tracheal deviation present or significant neck LA or mass Cardiovascular: Normal rate, regular rhythm, normal heart sounds and intact distal pulses.   Pulmonary/Chest: Effort normal and breath sounds without rales or wheezing  Abdominal: Soft. Bowel sounds are normal. NT. No HSM  Musculoskeletal: Normal range of motion. Exhibits no edema.  Lymphadenopathy:  Has no cervical adenopathy.  Neurological: Pt is alert and oriented to person, place, and time. Pt has normal reflexes. No cranial nerve deficit. Motor grossly intact Skin: Skin is warm and dry. No rash  noted.  Psychiatric:  Has flat mood and affect. Behavior is normal.     Assessment & Plan:

## 2014-05-08 NOTE — Progress Notes (Signed)
Pre visit review using our clinic review tool, if applicable. No additional management support is needed unless otherwise documented below in the visit note. 

## 2014-05-08 NOTE — Assessment & Plan Note (Signed)
stable overall by history and exam, recent data reviewed with pt, and pt to continue medical treatment as before,  to f/u any worsening symptoms or concerns Lab Results  Component Value Date   HGBA1C 7.0* 05/03/2014

## 2014-05-08 NOTE — Patient Instructions (Addendum)
You had the new Prevnar pneumonia shot today  You are also given the prescription for the Shingle shot since you can get this for free at [facility]  Please take all new medication as prescribed - the potassium pill  OK to stop the furosemide (lasix)  Please continue all other medications as before, and refills have been done if requested.  Please have the pharmacy call with any other refills you may need.  Please continue your efforts at being more active, low cholesterol diet, and weight control.  You are otherwise up to date with prevention measures today.  Please keep your appointments with your specialists as you may have planned  Please return in 6 months, or sooner if needed, with Lab testing done 3-5 days before

## 2014-05-08 NOTE — Assessment & Plan Note (Signed)
Mild, d/c lasix as also on hct, and add Kdur 10qd

## 2014-05-08 NOTE — Assessment & Plan Note (Signed)

## 2014-05-14 ENCOUNTER — Encounter: Payer: Self-pay | Admitting: Podiatry

## 2014-05-14 ENCOUNTER — Ambulatory Visit (INDEPENDENT_AMBULATORY_CARE_PROVIDER_SITE_OTHER): Payer: BLUE CROSS/BLUE SHIELD | Admitting: Podiatry

## 2014-05-14 VITALS — BP 140/63 | HR 84 | Resp 16

## 2014-05-14 DIAGNOSIS — M779 Enthesopathy, unspecified: Secondary | ICD-10-CM | POA: Diagnosis not present

## 2014-05-14 DIAGNOSIS — E1142 Type 2 diabetes mellitus with diabetic polyneuropathy: Secondary | ICD-10-CM | POA: Diagnosis not present

## 2014-05-14 NOTE — Progress Notes (Signed)
She presents today for follow-up of her Lyrica therapy for her neuropathy. She states that the burning and pain is going away which is allowing her to do more daily activities. Is also allowing her to rest better. She is also having pain/pressure to the second metatarsophalangeal joints bilaterally upon ambulation.  Objective: Vital signs are stable she is alert and oriented 3 she has palpable pulses bilateral. She has pain on end range of motion of this second metatarsal phalangeal joint bilateral.  Assessment: Capsulitis with diabetic peripheral neuropathy.  Plan: Injected the second metatarsophalangeal joint today bilaterally.

## 2014-06-11 ENCOUNTER — Ambulatory Visit (INDEPENDENT_AMBULATORY_CARE_PROVIDER_SITE_OTHER): Payer: BLUE CROSS/BLUE SHIELD | Admitting: Podiatry

## 2014-06-11 ENCOUNTER — Encounter: Payer: Self-pay | Admitting: Podiatry

## 2014-06-11 VITALS — BP 149/82 | HR 78 | Resp 12

## 2014-06-11 DIAGNOSIS — M779 Enthesopathy, unspecified: Secondary | ICD-10-CM | POA: Diagnosis not present

## 2014-06-11 DIAGNOSIS — E1142 Type 2 diabetes mellitus with diabetic polyneuropathy: Secondary | ICD-10-CM

## 2014-06-11 MED ORDER — MELOXICAM 15 MG PO TABS: 15.0000 mg | ORAL_TABLET | Freq: Every day | ORAL | Status: DC

## 2014-06-11 NOTE — Progress Notes (Signed)
She presents today for follow-up of her capsulitis second metatarsophalangeal joint bilaterally. She states that since the injection she is doing much better. She still has some pain in her bilateral heels.  Objective: Vital signs are stable she is alert and oriented 3. Pulses are palpable bilateral. No calf pain. She has no pain on range of motion or palpation of the second metatarsophalangeal joints bilaterally. However she does have pain on palpation of the bilateral calcaneus. Radiographs reviewed from previous visit does demonstrate soft tissue increase in density at the plantar fascial calcaneal insertion site.  Assessment: Plantar fasciitis bilateral. Resolving capsulitis second metatarsophalangeal joints bilateral.  Plan: Discussed etiology pathology conservative versus surgical therapies. Injected the bilateral heels today with Kenalog and local anesthetic. Placed her in a plantar fascial brace and a night splint. Also refilled her meloxicam 15 mg 1 by mouth daily #30 with 3 refills. I will follow-up with her in 1 month. We discussed appropriate shoe gear stretching exercises ice therapy and shoe modifications.

## 2014-07-02 ENCOUNTER — Emergency Department (HOSPITAL_COMMUNITY)
Admission: EM | Admit: 2014-07-02 | Discharge: 2014-07-02 | Disposition: A | Discharge status: Home / Self Care | Payer: BLUE CROSS/BLUE SHIELD | Source: Home / Self Care | Attending: Family Medicine | Admitting: Family Medicine

## 2014-07-02 ENCOUNTER — Emergency Department (INDEPENDENT_AMBULATORY_CARE_PROVIDER_SITE_OTHER): Payer: BLUE CROSS/BLUE SHIELD

## 2014-07-02 ENCOUNTER — Encounter (HOSPITAL_COMMUNITY): Payer: Self-pay | Admitting: Emergency Medicine

## 2014-07-02 DIAGNOSIS — S9031XA Contusion of right foot, initial encounter: Secondary | ICD-10-CM

## 2014-07-02 NOTE — ED Provider Notes (Signed)
CSN: [id number]     Arrival date & time 07/02/14  0918 History   First MD Initiated Contact with Patient 07/02/14 1122     Chief Complaint  Patient presents with  . Foot Injury   (Consider location/radiation/quality/duration/timing/severity/associated sxs/prior Treatment) HPI  Right foot pain. Started 4 days ago when patient slipped and hit her foot. Primarily on the medial dorsal surface near the first great toe. Pain is constant. Worse with ambulation. Associated with swelling and redness in that area. No improvement since onset. Warm salt water soaks without improvement. Denies previous injury to this area. Denies other joint swelling, rash, fevers.  Past Medical History  Diagnosis Date  . DM w/o Complication Type II 11/15/2006  . HYPERLIPIDEMIA 05/01/2007  . HYPOKALEMIA 10/02/2008  . ANXIETY, SITUATIONAL 09/02/2009  . CARPAL TUNNEL SYNDROME, RIGHT 11/15/2006  . GLAUCOMA 07/04/2008  . HYPERTENSION 11/15/2006  . ALLERGIC RHINITIS 05/01/2007  . GERD 11/15/2006  . ANKLE PAIN, RIGHT 09/02/2009  . THUMB PAIN, RIGHT 04/02/2009  . OSTEOPENIA 09/02/2009  . Low back pain   . Generalized osteoarthritis 10/27/2012    bilat ankles, knees, hands and wrists   Past Surgical History  Procedure Laterality Date  . Tonsillectomy    . Right shoulder sugury     Family History  Problem Relation Age of Onset  . Cancer Brother     throat cancer, smoker  . Heart disease Other   . Hypertension Other   . Cancer Other     lung cancer   History  Substance Use Topics  . Smoking status: Never Smoker   . Smokeless tobacco: Not on file  . Alcohol Use: No   OB History    No data available     Review of Systems Per HPI with all other pertinent systems negative.   Allergies  Lovastatin; Metformin; and Penicillins  Home Medications   Prior to Admission medications   Medication Sig Start Date End Date Taking? Authorizing Provider  aspirin 81 MG tablet Take 81 mg by mouth daily.     Yes Historical  Provider, MD  fish oil-omega-3 fatty acids 1000 MG capsule Take 2 g by mouth daily.   Yes Historical Provider, MD  glimepiride (AMARYL) 2 MG tablet Take 1 tablet (2 mg total) by mouth daily before breakfast. 04/27/13  Yes Corwin Levins, MD  lidocaine (LIDODERM) 5 % Place 1 patch onto the skin 3 (three) times daily as needed. Remove & Discard patch within 12 hours or as directed by MD 10/30/12  Yes Corwin Levins, MD  meloxicam (MOBIC) 15 MG tablet Take 1 tablet (15 mg total) by mouth daily. 11/07/13  Yes Corwin Levins, MD  meloxicam (MOBIC) 15 MG tablet Take 1 tablet (15 mg total) by mouth daily. 06/11/14  Yes Max T Hyatt, DPM  olmesartan-hydrochlorothiazide (BENICAR HCT) 40-25 MG per tablet Take 1 tablet by mouth daily. 04/27/13  Yes Corwin Levins, MD  pantoprazole (PROTONIX) 40 MG tablet TAKE ONE TABLET BY MOUTH ONCE DAILY 05/11/13  Yes Corwin Levins, MD  potassium chloride (K-DUR) 10 MEQ tablet Take 1 tablet (10 mEq total) by mouth daily. 05/08/14  Yes Corwin Levins, MD  pravastatin (PRAVACHOL) 80 MG tablet Take 1 tablet (80 mg total) by mouth daily. 05/08/14  Yes Corwin Levins, MD  pregabalin (LYRICA) 75 MG capsule Take 1 capsule (75 mg total) by mouth 3 (three) times daily. 04/16/14  Yes Max T Hyatt, DPM   BP 132/86 mmHg  Pulse 75  Temp(Src) 97 F (36.1 C) (Oral)  Resp 16  SpO2 100% Physical Exam Physical Exam  Constitutional: oriented to person, place, and time. appears well-developed and well-nourished. No distress.  HENT:  Head: Normocephalic and atraumatic.  Eyes: EOMI. PERRL.  Neck: Normal range of motion.  Cardiovascular: RRR, no m/r/g, 2+ distal pulses,  Pulmonary/Chest: Effort normal and breath sounds normal. No respiratory distress.  Abdominal: Soft. Bowel sounds are normal. NonTTP, no distension.  Musculoskeletal: Right first MTP erythema and swelling with proximal tracking along the dorsum of the foot. Tender to palpation. Toe with passive full range of motion. No bony upper body noted..   Neurological: alert and oriented to person, place, and time.  Skin: Skin is warm. No rash noted. non diaphoretic.  Psychiatric: normal mood and affect. behavior is normal. Judgment and thought content normal.   ED Course  Procedures (including critical care time) Labs Review Labs Reviewed - No data to display  Imaging Review Dg Foot Complete Right  07/02/2014   CLINICAL DATA:  Slipped getting out of tub on Friday night striking corner of tub with foot, pain at RIGHT great and second toes, unable to bend, swelling, redness, bruising  EXAM: RIGHT FOOT COMPLETE - 3+ VIEW  COMPARISON:  11/15/2013  FINDINGS: Question mild osseous demineralization.  Mild degenerative changes at TMT joints dorsally.  Joint spaces otherwise preserved.  Plantar and Achilles insertion calcaneal spur formation.  No acute fracture, dislocation or bone destruction.  Dorsal soft tissue swelling of foot overlying the distal metatarsals.  IMPRESSION: Calcaneal spurring.  No acute osseous abnormalities.   Electronically Signed   By: Ulyses SouthwardMark  Boles M.D.   On: 07/02/2014 12:37     MDM   1. Foot contusion, right, initial encounter    No fracture seen on plain film. Postop shoe. Continue meloxicam. Heat and return to full activity as tolerated.    Ozella Rocksavid J Mekaylah Klich, MD 07/02/14 [phone]

## 2014-07-02 NOTE — Discharge Instructions (Signed)
He had bruised but not broken her foot. Please use the postop shoe as needed for relief. Please continue your meloxicam for additional pain and inflammatory relief. Please apply heat as needed. Please return to normal activities as tolerated.

## 2014-07-02 NOTE — ED Notes (Signed)
Reports injuring right foot 1 wk ago.  States "I slipped in the shower and my right foot hit against the tub wall".   Pt has pain and swelling across the top of the right foot.  Pain with walking.

## 2014-07-03 ENCOUNTER — Ambulatory Visit (INDEPENDENT_AMBULATORY_CARE_PROVIDER_SITE_OTHER): Payer: BLUE CROSS/BLUE SHIELD

## 2014-07-03 ENCOUNTER — Ambulatory Visit (INDEPENDENT_AMBULATORY_CARE_PROVIDER_SITE_OTHER): Payer: BLUE CROSS/BLUE SHIELD | Admitting: Podiatry

## 2014-07-03 DIAGNOSIS — R52 Pain, unspecified: Secondary | ICD-10-CM | POA: Diagnosis not present

## 2014-07-03 DIAGNOSIS — S9031XA Contusion of right foot, initial encounter: Secondary | ICD-10-CM

## 2014-07-03 DIAGNOSIS — S92911A Unspecified fracture of right toe(s), initial encounter for closed fracture: Secondary | ICD-10-CM | POA: Diagnosis not present

## 2014-07-03 NOTE — Progress Notes (Signed)
   Subjective:    Patient ID: Alice Hickman, female    DOB: [birth year], 64 y.o.   MRN: [id number]  HPI 64 year old female presents the office today for follow-up evaluation after going to urgent care for right foot pain. She states the proxy one week ago she fell gait on the shower hitting her right foot on a cabinet. She states that the injury happened Friday morning. By Friday night she had increased swelling to the foot and by Saturday she was unable to the pressure on her foot due to swelling and pain. She was seen in urgent care where x-rays were obtained and did not reveal a fracture. She was placed into a surgical shoe. She continues have pain to the foot as well as swelling. She has been taking lyrica (without problems). She has also been taking mobic for the pain, which seems to help. No other complaints at this time. No other injuries at the time of the fall.   Review of Systems  Musculoskeletal: Positive for back pain, joint swelling and gait problem.       Objective:   Physical Exam AAO x3, NAD DP/PT pulses palpable, CRT less than 3 seconds Protective sensation decreased since Weinstein monofilament There is moderate edema overlying the dorsal aspect of the right foot overlying the medial aspect overlying metatarsals 1 through 3 into the digits. There is tenderness palpation overlying the metatarsals as well as the third digit. There is no tenderness along the fourth or fifth metatarsal/digit or proximal foot. There is no tenderness to palpation overlying the ankle joint or along the tibia/fibula. No proximal tib-fib pain. Ankle joint, subtalar joint range of motion is intact and without pain. MTPJ range of motion is limited due to swelling and pain. No other areas of tenderness to bilateral lower extremities.  No open lesions or pre-ulcerative lesions No pain with calf compression, swelling, warmth, erythema.        Assessment & Plan:  64 year old female with right foot  contusion, possible third toe fracture. -X-rays were obtained and reviewed with the patient.  -Treatment options discussed including all alternatives, risks, and complications -At this time recommend continue immobilization in surgical shoe due to swelling and pain. Also ACE bandages were applied to the foot/leg to help with swelling. Ice and elevation. -Follow-up with Dr. Al CorpusHyatt per her request. In the meantime, call with any further questions/concerns/change in symptoms.

## 2014-07-09 ENCOUNTER — Encounter: Payer: BLUE CROSS/BLUE SHIELD | Admitting: Podiatry

## 2014-07-11 NOTE — Progress Notes (Signed)
This encounter was created in error - please disregard.

## 2014-07-18 ENCOUNTER — Ambulatory Visit (INDEPENDENT_AMBULATORY_CARE_PROVIDER_SITE_OTHER): Payer: BLUE CROSS/BLUE SHIELD | Admitting: Internal Medicine

## 2014-07-18 ENCOUNTER — Ambulatory Visit
Admission: RE | Admit: 2014-07-18 | Discharge: 2014-07-18 | Disposition: A | Discharge status: Home / Self Care | Payer: BLUE CROSS/BLUE SHIELD | Source: Ambulatory Visit | Attending: Internal Medicine | Admitting: Internal Medicine

## 2014-07-18 ENCOUNTER — Encounter: Payer: Self-pay | Admitting: Internal Medicine

## 2014-07-18 ENCOUNTER — Ambulatory Visit (INDEPENDENT_AMBULATORY_CARE_PROVIDER_SITE_OTHER)
Admission: RE | Admit: 2014-07-18 | Discharge: 2014-07-18 | Disposition: A | Discharge status: Home / Self Care | Payer: BLUE CROSS/BLUE SHIELD | Source: Ambulatory Visit | Attending: Internal Medicine | Admitting: Internal Medicine

## 2014-07-18 VITALS — BP 130/78 | HR 103 | Ht 63.0 in | Wt 220.0 lb

## 2014-07-18 DIAGNOSIS — L03116 Cellulitis of left lower limb: Secondary | ICD-10-CM

## 2014-07-18 DIAGNOSIS — M79671 Pain in right foot: Secondary | ICD-10-CM | POA: Diagnosis not present

## 2014-07-18 MED ORDER — PREDNISONE 10 MG PO TABS: ORAL_TABLET | ORAL | Status: DC

## 2014-07-18 MED ORDER — KETOROLAC TROMETHAMINE 30 MG/ML IJ SOLN
30.0000 mg | Freq: Once | INTRAMUSCULAR | Status: AC
  Administered 2014-07-18: 30 mg via INTRAVENOUS

## 2014-07-18 MED ORDER — LEVOFLOXACIN 250 MG PO TABS: 250.0000 mg | ORAL_TABLET | Freq: Every day | ORAL | Status: DC

## 2014-07-18 MED ORDER — OXYCODONE HCL 5 MG PO TABS: 5.0000 mg | ORAL_TABLET | ORAL | Status: DC | PRN

## 2014-07-18 MED ORDER — METHYLPREDNISOLONE ACETATE 80 MG/ML IJ SUSP
80.0000 mg | Freq: Once | INTRAMUSCULAR | Status: AC
  Administered 2014-07-18: 80 mg via INTRAMUSCULAR

## 2014-07-18 MED ORDER — DOXYCYCLINE HYCLATE 100 MG PO TABS: 100.0000 mg | ORAL_TABLET | Freq: Two times a day (BID) | ORAL | Status: DC

## 2014-07-18 NOTE — Assessment & Plan Note (Addendum)
?   Gout vs post traumatic, for depomedrol IM, predpac asd,  to f/u any worsening symptoms or concerns, for right foot film - r/o fx due to slip and trauma in the BR

## 2014-07-18 NOTE — Progress Notes (Signed)
Pre visit review using our clinic review tool, if applicable. No additional management support is needed unless otherwise documented below in the visit note. 

## 2014-07-18 NOTE — Assessment & Plan Note (Addendum)
Cellulitis vs severe gout, for doxy/levaquin asd, pain control, work note given,  to f/u any worsening symptoms or concerns, also for left foot film r/o osteal rxn  Note:  Total time for pt hx, exam, review of record with pt in the room, determination of diagnoses and plan for further eval and tx is > 40 min, with over 50% spent in coordination and counseling of patient

## 2014-07-18 NOTE — Progress Notes (Signed)
Subjective:    Patient ID: Alice Hickman, female    DOB: [birth year], 64 y.o.   MRN: [id number]  HPI  Here to f/u bilat feet pain ongoing to some degree at least prior to oct 2015, but more recently had a slip in the shower with truamatic injury to right foot late may 2016, saw podiatry Jun1 - films per [facility] provider neg for fracture.  Was sched to come back June 22 to primary podiatrist for f/u.  Still trying to work at [employer],  [employer] states she has to wear shjoe to work, swelling made rubbing worse.to right first MTP area that was swollen from the right foot slip in the BR on hard cabinet.   Has had left foot pain for years, last xray oct 2015,  But also hurting worse in the past 2 wks after 2 cans of food fell on dorsal left foot, which started getting better at one point, but seemed worse after with worsenin pain/swelling and now redness worse in the past wk. No fevers , chills but left foot dorsal especially has been hot.  No drainage.  Trying to work at [employer], but also having lift up to 60 lbs onto a shelf, which now has led to worsening backache as well.  Past Medical History  Diagnosis Date  . DM w/o Complication Type II 93/81/8299  . HYPERLIPIDEMIA 05/01/2007  . HYPOKALEMIA 10/02/2008  . ANXIETY, SITUATIONAL 09/02/2009  . CARPAL TUNNEL SYNDROME, RIGHT 11/15/2006  . GLAUCOMA 07/04/2008  . HYPERTENSION 11/15/2006  . ALLERGIC RHINITIS 05/01/2007  . GERD 11/15/2006  . ANKLE PAIN, RIGHT 09/02/2009  . THUMB PAIN, RIGHT 04/02/2009  . OSTEOPENIA 09/02/2009  . Low back pain   . Generalized osteoarthritis 10/27/2012    bilat ankles, knees, hands and wrists   Past Surgical History  Procedure Laterality Date  . Tonsillectomy    . Right shoulder sugury      reports that she has never smoked. She does not have any smokeless tobacco history on file. She reports that she does not drink alcohol or use illicit drugs. family history includes Cancer in her brother and other; Heart disease in  her other; Hypertension in her other. Allergies  Allergen Reactions  . Lovastatin     REACTION: leg cramps  . Metformin     REACTION: GI upset  . Penicillins     REACTION: erythema multiforme 2001   Current Outpatient Prescriptions on File Prior to Visit  Medication Sig Dispense Refill  . aspirin 81 MG tablet Take 81 mg by mouth daily.      . fish oil-omega-3 fatty acids 1000 MG capsule Take 2 g by mouth daily.    Marland Kitchen glimepiride (AMARYL) 2 MG tablet Take 1 tablet (2 mg total) by mouth daily before breakfast. 90 tablet 3  . lidocaine (LIDODERM) 5 % Place 1 patch onto the skin 3 (three) times daily as needed. Remove & Discard patch within 12 hours or as directed by MD 90 patch 2  . meloxicam (MOBIC) 15 MG tablet Take 1 tablet (15 mg total) by mouth daily. 30 tablet 5  . olmesartan-hydrochlorothiazide (BENICAR HCT) 40-25 MG per tablet Take 1 tablet by mouth daily. 90 tablet 3  . pantoprazole (PROTONIX) 40 MG tablet TAKE ONE TABLET BY MOUTH ONCE DAILY 90 tablet 3  . potassium chloride (K-DUR) 10 MEQ tablet Take 1 tablet (10 mEq total) by mouth daily. 90 tablet 3  . pravastatin (PRAVACHOL) 80 MG tablet Take 1 tablet (  80 mg total) by mouth daily. 90 tablet 3  . pregabalin (LYRICA) 75 MG capsule Take 1 capsule (75 mg total) by mouth 3 (three) times daily. 90 capsule 5  . meloxicam (MOBIC) 15 MG tablet Take 1 tablet (15 mg total) by mouth daily. (Patient not taking: Reported on 07/18/2014) 30 tablet 3   No current facility-administered medications on file prior to visit.    Review of Systems  Constitutional: Negative for unusual diaphoresis or night sweats HENT: Negative for ringing in ear or discharge Eyes: Negative for double vision or worsening visual disturbance.  Respiratory: Negative for choking and stridor.   Gastrointestinal: Negative for vomiting or other signifcant bowel change Genitourinary: Negative for hematuria or change in urine volume.  Musculoskeletal: Negative for other MSK  pain or swelling Skin: Negative for color change and worsening wound.  Neurological: Negative for tremors and numbness other than noted  Psychiatric/Behavioral: Negative for decreased concentration or agitation other than above       Objective:   Physical Exam BP 130/78 mmHg  Pulse 103  Ht 5\' 3"  (1.6 m)  Wt 220 lb (99.791 kg)  BMI 38.98 kg/m2  SpO2 98% VS noted,  Constitutional: Pt appears in no significant distress HENT: Head: NCAT.  Right Ear: External ear normal.  Left Ear: External ear normal.  Eyes: . Pupils are equal, round, and reactive to light. Conjunctivae and EOM are normal Neck: Normal range of motion. Neck supple.  Cardiovascular: Normal rate and regular rhythm.   Pulmonary/Chest: Effort normal and breath sounds without rales or wheezing.  Neurological: Pt is alert. Not confused , motor grossly intact Skin: Skin is warm. No rash, no LE edema Psychiatric: Pt behavior is normal. No agitation.  Dorsalis pedis 1+ bilat Right foot with pes planus, 1+ swelling/2+ tender right first MTP with bunion Left foot with 3+ red/tender/swelling dorsal foot primarily without fluctuance, ulcer, drainage, red streaks    Assessment & Plan:

## 2014-07-18 NOTE — Patient Instructions (Signed)
You had the toradol 30 mg pain shot, as well as the steroid shot today  Please take all new medication as prescribed - the pain medication, prednisone, and antibiotic  Please continue all other medications as before, and refills have been done if requested.  Please have the pharmacy call with any other refills you may need.  Please keep your appointments with your specialists as you may have planned  Please go to the XRAY Department in the Basement (go straight as you get off the elevator) for the x-ray testing  You will be contacted by phone if any changes need to be made immediately.  Otherwise, you will receive a letter about your results with an explanation, but please check with MyChart first.  Please remember to sign up for MyChart if you have not done so, as this will be important to you in the future with finding out test results, communicating by private email, and scheduling acute appointments online when needed.  You are given the note for [employer] as well to be off work;  Please plan to followup with podiatry as planned on June 22, as they can extend the need for being off work if needed

## 2014-07-23 ENCOUNTER — Ambulatory Visit (INDEPENDENT_AMBULATORY_CARE_PROVIDER_SITE_OTHER): Payer: BLUE CROSS/BLUE SHIELD | Admitting: Podiatry

## 2014-07-23 ENCOUNTER — Encounter: Payer: Self-pay | Admitting: Podiatry

## 2014-07-23 VITALS — BP 124/86 | HR 69 | Resp 12

## 2014-07-23 DIAGNOSIS — M778 Other enthesopathies, not elsewhere classified: Secondary | ICD-10-CM

## 2014-07-23 DIAGNOSIS — M10079 Idiopathic gout, unspecified ankle and foot: Secondary | ICD-10-CM | POA: Diagnosis not present

## 2014-07-23 DIAGNOSIS — M7752 Other enthesopathy of left foot: Secondary | ICD-10-CM | POA: Diagnosis not present

## 2014-07-23 DIAGNOSIS — M779 Enthesopathy, unspecified: Principal | ICD-10-CM

## 2014-07-23 NOTE — Patient Instructions (Signed)

## 2014-07-24 NOTE — Progress Notes (Signed)
She presents today for a chief complaint of a painful first metatarsophalangeal joint of the left foot. She states has been bothering her for the last few days since she accidentally hit her foot on her daughter's computer. She states that currently she is taking a sterilely packed from another doctor.  Objective: Vital signs are stable alert and oriented 3 she has tenderness with erythema and edema to the first metatarsophalangeal joint of the left foot..  Assessment probable gouty arthritis bilateral left greater than right.  Plan: I will follow-up with her in a few weeks at which time we will consider taking blood work should her symptoms persist.

## 2014-07-30 ENCOUNTER — Telehealth: Payer: Self-pay | Admitting: Internal Medicine

## 2014-07-30 NOTE — Telephone Encounter (Signed)
I would defer this to podiatry who be better able to judge this, thanks, pt has just seen podiatry

## 2014-07-30 NOTE — Telephone Encounter (Signed)
Patient is unable to get her foot into a shoe without it scraping and irritating. She was wanting an extension on her out of work note for maybe a week to give her foot time to heal. Please advise patient Best number is [phone]

## 2014-07-31 ENCOUNTER — Telehealth: Payer: Self-pay | Admitting: Podiatry

## 2014-07-31 ENCOUNTER — Encounter: Payer: Self-pay | Admitting: Podiatry

## 2014-07-31 NOTE — Telephone Encounter (Signed)
Notified patient.

## 2014-07-31 NOTE — Telephone Encounter (Signed)
Patient states that the podiatrist is out of the office.  I have referred her back to that office to see if someone in that office can write her a letter.  I told her I would ask again to see if Dr. Jonny RuizJohn would be able to do this in case she can not get a letter from that office.

## 2014-07-31 NOTE — Telephone Encounter (Signed)
I would not feel comfortable with this, as I cannot know how long she would need to be further out of work

## 2014-07-31 NOTE — Telephone Encounter (Signed)
Patient came in to request that we extend her absence from work 2-3 more days due to her not being able to put tenny shoes on. Okayed by nurse Vikki PortsValerie to extend patients number of days out of work.

## 2014-08-01 ENCOUNTER — Other Ambulatory Visit: Payer: Self-pay | Admitting: Internal Medicine

## 2014-08-01 NOTE — Telephone Encounter (Signed)
amaryl rx sent to pharm 

## 2014-08-21 ENCOUNTER — Other Ambulatory Visit: Payer: Self-pay | Admitting: Internal Medicine

## 2014-08-22 ENCOUNTER — Ambulatory Visit: Payer: BLUE CROSS/BLUE SHIELD | Admitting: Podiatry

## 2014-08-29 ENCOUNTER — Ambulatory Visit (INDEPENDENT_AMBULATORY_CARE_PROVIDER_SITE_OTHER): Payer: BLUE CROSS/BLUE SHIELD | Admitting: Podiatry

## 2014-08-29 ENCOUNTER — Encounter: Payer: Self-pay | Admitting: Podiatry

## 2014-08-29 VITALS — BP 126/74 | HR 95 | Resp 16

## 2014-08-29 DIAGNOSIS — M10079 Idiopathic gout, unspecified ankle and foot: Secondary | ICD-10-CM

## 2014-08-29 DIAGNOSIS — M779 Enthesopathy, unspecified: Secondary | ICD-10-CM

## 2014-08-29 MED ORDER — INDOMETHACIN 25 MG PO CAPS: 25.0000 mg | ORAL_CAPSULE | Freq: Two times a day (BID) | ORAL | Status: DC

## 2014-08-29 MED ORDER — PREGABALIN 75 MG PO CAPS: 75.0000 mg | ORAL_CAPSULE | Freq: Three times a day (TID) | ORAL | Status: DC

## 2014-08-30 LAB — C-REACTIVE PROTEIN: CRP: 0.8 mg/dL — ABNORMAL HIGH (ref <0.60)

## 2014-08-30 LAB — URIC ACID: Uric Acid, Serum: 8.6 mg/dL — ABNORMAL HIGH (ref 2.4–7.0)

## 2014-08-30 LAB — RHEUMATOID FACTOR

## 2014-08-31 LAB — SEDIMENTATION RATE: Sed Rate: 20 mm/hr (ref 0–30)

## 2014-08-31 NOTE — Progress Notes (Signed)
She presents today for follow-up of gouty arthritis first metatarsophalangeal joint of the right foot. She states that she really hasn't had a flare but the foot just doesn't feel a whole lot better. She denies any trauma. States this just very sore to walk on and she can hardly find shoe gear to accommodate her foot.  Objective: Vital signs are stable she is alert and oriented 3. Pulses are palpable right. Hallux valgus deformity is noted bilateral right greater than left with some edema about the first metatarsophalangeal joint right. She has pain on palpation and range of motion of the joint.  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  Plan: Injected the area today with dexamethasone and local anesthesia. We've requested blood work for a rheumatic workup. I wrote a prescription for indomethacin 25 mg 1 by mouth daily to twice a day when necessary pain. I will follow-up with her in 1 month.

## 2014-09-02 LAB — ANA: Anti Nuclear Antibody(ANA): NEGATIVE

## 2014-09-02 NOTE — Progress Notes (Signed)
Called - L/M to call

## 2014-09-26 ENCOUNTER — Ambulatory Visit (INDEPENDENT_AMBULATORY_CARE_PROVIDER_SITE_OTHER): Payer: BLUE CROSS/BLUE SHIELD | Admitting: Podiatry

## 2014-09-26 ENCOUNTER — Encounter: Payer: Self-pay | Admitting: Podiatry

## 2014-09-26 VITALS — BP 154/77 | HR 69 | Resp 16

## 2014-09-26 DIAGNOSIS — M10079 Idiopathic gout, unspecified ankle and foot: Secondary | ICD-10-CM

## 2014-09-26 DIAGNOSIS — E1142 Type 2 diabetes mellitus with diabetic polyneuropathy: Secondary | ICD-10-CM | POA: Diagnosis not present

## 2014-09-27 NOTE — Progress Notes (Signed)
She presents today for follow-up of capsulitis , gouty capsulitis first metatarsophalangeal joints bilaterally. Blood work results as well.  Objective: vital signs are stable she is alert and oriented 3. Much decrease in edema and pain on range of motion of the first metatarsophalangeal joints bilateral. Blood work was positive for elevated CRP as well as uric acid.   Assessment: gouty capsulitis acute in nature.  Plan: provided her with a gout diet and discussed the possibility of recurrence. Should this occur she is to notify us immediately for more blood work. At which time we will more than likely will have to start her on allopurinol.

## 2014-11-14 ENCOUNTER — Telehealth: Payer: Self-pay | Admitting: Internal Medicine

## 2014-11-14 ENCOUNTER — Other Ambulatory Visit (INDEPENDENT_AMBULATORY_CARE_PROVIDER_SITE_OTHER): Payer: BLUE CROSS/BLUE SHIELD

## 2014-11-14 ENCOUNTER — Encounter: Payer: Self-pay | Admitting: Internal Medicine

## 2014-11-14 ENCOUNTER — Ambulatory Visit (INDEPENDENT_AMBULATORY_CARE_PROVIDER_SITE_OTHER): Payer: BLUE CROSS/BLUE SHIELD | Admitting: Internal Medicine

## 2014-11-14 VITALS — BP 130/76 | HR 82 | Temp 98.1°F | Ht 63.0 in | Wt 227.0 lb

## 2014-11-14 DIAGNOSIS — E785 Hyperlipidemia, unspecified: Secondary | ICD-10-CM

## 2014-11-14 DIAGNOSIS — E1140 Type 2 diabetes mellitus with diabetic neuropathy, unspecified: Secondary | ICD-10-CM

## 2014-11-14 DIAGNOSIS — M179 Osteoarthritis of knee, unspecified: Secondary | ICD-10-CM

## 2014-11-14 DIAGNOSIS — Z0000 Encounter for general adult medical examination without abnormal findings: Secondary | ICD-10-CM

## 2014-11-14 DIAGNOSIS — M109 Gout, unspecified: Secondary | ICD-10-CM | POA: Diagnosis not present

## 2014-11-14 DIAGNOSIS — G629 Polyneuropathy, unspecified: Secondary | ICD-10-CM

## 2014-11-14 DIAGNOSIS — M1711 Unilateral primary osteoarthritis, right knee: Secondary | ICD-10-CM

## 2014-11-14 DIAGNOSIS — Z0189 Encounter for other specified special examinations: Secondary | ICD-10-CM

## 2014-11-14 DIAGNOSIS — E876 Hypokalemia: Secondary | ICD-10-CM

## 2014-11-14 DIAGNOSIS — M159 Polyosteoarthritis, unspecified: Secondary | ICD-10-CM

## 2014-11-14 HISTORY — DX: Gout, unspecified: M10.9

## 2014-11-14 HISTORY — DX: Polyneuropathy, unspecified: G62.9

## 2014-11-14 LAB — CBC WITH DIFFERENTIAL/PLATELET
BASOS ABS: 0 10*3/uL (ref 0.0–0.1)
Basophils Relative: 0.4 % (ref 0.0–3.0)
EOS ABS: 0.4 10*3/uL (ref 0.0–0.7)
Eosinophils Relative: 6.3 % — ABNORMAL HIGH (ref 0.0–5.0)
HEMATOCRIT: 40.8 % (ref 36.0–46.0)
HEMOGLOBIN: 13.4 g/dL (ref 12.0–15.0)
LYMPHS PCT: 38.4 % (ref 12.0–46.0)
Lymphs Abs: 2.6 10*3/uL (ref 0.7–4.0)
MCHC: 32.9 g/dL (ref 30.0–36.0)
MCV: 85.5 fl (ref 78.0–100.0)
Monocytes Absolute: 0.5 10*3/uL (ref 0.1–1.0)
Monocytes Relative: 7.6 % (ref 3.0–12.0)
Neutro Abs: 3.1 10*3/uL (ref 1.4–7.7)
Neutrophils Relative %: 47.3 % (ref 43.0–77.0)
Platelets: 227 10*3/uL (ref 150.0–400.0)
RBC: 4.77 Mil/uL (ref 3.87–5.11)
RDW: 15.1 % (ref 11.5–15.5)
WBC: 6.6 10*3/uL (ref 4.0–10.5)

## 2014-11-14 LAB — HEMOGLOBIN A1C: Hgb A1c MFr Bld: 6.6 % — ABNORMAL HIGH (ref 4.6–6.5)

## 2014-11-14 LAB — HEPATIC FUNCTION PANEL
ALBUMIN: 3.8 g/dL (ref 3.5–5.2)
ALT: 24 U/L (ref 0–35)
AST: 20 U/L (ref 0–37)
Alkaline Phosphatase: 55 U/L (ref 39–117)
Bilirubin, Direct: 0.1 mg/dL (ref 0.0–0.3)
TOTAL PROTEIN: 7.5 g/dL (ref 6.0–8.3)
Total Bilirubin: 0.4 mg/dL (ref 0.2–1.2)

## 2014-11-14 LAB — URINALYSIS, ROUTINE W REFLEX MICROSCOPIC
BILIRUBIN URINE: NEGATIVE
Hgb urine dipstick: NEGATIVE
Ketones, ur: NEGATIVE
Leukocytes, UA: NEGATIVE
NITRITE: NEGATIVE
PH: 6.5 (ref 5.0–8.0)
RBC / HPF: NONE SEEN (ref 0–?)
Specific Gravity, Urine: 1.015 (ref 1.000–1.030)
TOTAL PROTEIN, URINE-UPE24: NEGATIVE
UROBILINOGEN UA: 0.2 (ref 0.0–1.0)
Urine Glucose: NEGATIVE

## 2014-11-14 LAB — MICROALBUMIN / CREATININE URINE RATIO
Creatinine,U: 74.6 mg/dL
Microalb Creat Ratio: 0.9 mg/g (ref 0.0–30.0)
Microalb, Ur: <0.7 mg/dL (ref 0.0–1.9)

## 2014-11-14 LAB — BASIC METABOLIC PANEL
BUN: 9 mg/dL (ref 6–23)
CHLORIDE: 102 meq/L (ref 96–112)
CO2: 34 meq/L — AB (ref 19–32)
CREATININE: 0.75 mg/dL (ref 0.40–1.20)
Calcium: 9.6 mg/dL (ref 8.4–10.5)
GFR: 100.56 mL/min (ref >60.00)
GLUCOSE: 75 mg/dL (ref 70–99)
POTASSIUM: 3.3 meq/L — AB (ref 3.5–5.1)
Sodium: 142 mEq/L (ref 135–145)

## 2014-11-14 LAB — LIPID PANEL
CHOL/HDL RATIO: 4
CHOLESTEROL: 194 mg/dL (ref 0–200)
HDL: 47.8 mg/dL (ref >39.00)
LDL Cholesterol: 126 mg/dL — ABNORMAL HIGH (ref 0–99)
NonHDL: 146.48
TRIGLYCERIDES: 102 mg/dL (ref 0.0–149.0)
VLDL: 20.4 mg/dL (ref 0.0–40.0)

## 2014-11-14 LAB — TSH: TSH: 1.24 u[IU]/mL (ref 0.35–4.50)

## 2014-11-14 MED ORDER — DICLOFENAC SODIUM 1 % TD GEL: 4.0000 g | Freq: Four times a day (QID) | TRANSDERMAL | Status: DC | PRN

## 2014-11-14 MED ORDER — ATORVASTATIN CALCIUM 40 MG PO TABS: 40.0000 mg | ORAL_TABLET | Freq: Every day | ORAL | Status: DC

## 2014-11-14 MED ORDER — POTASSIUM CHLORIDE ER 10 MEQ PO TBCR: 10.0000 meq | EXTENDED_RELEASE_TABLET | Freq: Every day | ORAL | Status: DC

## 2014-11-14 MED ORDER — INDOMETHACIN 25 MG PO CAPS: 25.0000 mg | ORAL_CAPSULE | Freq: Two times a day (BID) | ORAL | Status: DC

## 2014-11-14 NOTE — Progress Notes (Addendum)
Subjective:    Patient ID: Alice Hickman, female    DOB: 01-30-1951, 64 y.o.   MRN: [id number]  HPI  Here to f/u; overall doing ok,  Pt denies chest pain, increasing sob or doe, wheezing, orthopnea, PND, increased LE swelling, palpitations, dizziness or syncope.  Pt denies new neurological symptoms such as new headache, or facial or extremity weakness or numbness.  Pt denies polydipsia, polyuria, or low sugar episode.   Pt denies new neurological symptoms such as new headache, or facial or extremity weakness or numbness.   Pt states overall good compliance with meds, mostly trying to follow appropriate diet, with wt overall stable,  but little exercise however due to chronic feet pain and right knee pain. Does not see ortho. Per podiatry dx with gout and periph neuropathy, now on lyrica and indocin but pt unaware of the indocin rx sent in, not taking.  Pt states was mentioned to start what sounds like allopurinol if recurs. Has not been taking the potassium pill. Past Medical History  Diagnosis Date  . DM w/o Complication Type II 47/65/4650  . HYPERLIPIDEMIA 05/01/2007  . HYPOKALEMIA 10/02/2008  . ANXIETY, SITUATIONAL 09/02/2009  . CARPAL TUNNEL SYNDROME, RIGHT 11/15/2006  . GLAUCOMA 07/04/2008  . HYPERTENSION 11/15/2006  . ALLERGIC RHINITIS 05/01/2007  . GERD 11/15/2006  . ANKLE PAIN, RIGHT 09/02/2009  . THUMB PAIN, RIGHT 04/02/2009  . OSTEOPENIA 09/02/2009  . Low back pain   . Generalized osteoarthritis 10/27/2012    bilat ankles, knees, hands and wrists  . Peripheral neuropathy ([place]) 11/14/2014  . Gout 11/14/2014   Past Surgical History  Procedure Laterality Date  . Tonsillectomy    . Right shoulder sugury      reports that she has never smoked. She does not have any smokeless tobacco history on file. She reports that she does not drink alcohol or use illicit drugs. family history includes Cancer in her brother and other; Heart disease in her other; Hypertension in her other. Allergies    Allergen Reactions  . Lovastatin     REACTION: leg cramps  . Metformin     REACTION: GI upset  . Penicillins     REACTION: erythema multiforme 2001   Current Outpatient Prescriptions on File Prior to Visit  Medication Sig Dispense Refill  . aspirin 81 MG tablet Take 81 mg by mouth daily.      Marland Kitchen BENICAR HCT 40-25 MG per tablet TAKE ONE TABLET BY MOUTH ONCE DAILY 90 tablet 1  . fish oil-omega-3 fatty acids 1000 MG capsule Take 2 g by mouth daily.    Marland Kitchen glimepiride (AMARYL) 2 MG tablet TAKE ONE TABLET BY MOUTH ONCE DAILY BEFORE  BREAKFAST 90 tablet 1  . lidocaine (LIDODERM) 5 % Place 1 patch onto the skin 3 (three) times daily as needed. Remove & Discard patch within 12 hours or as directed by MD 90 patch 2  . meloxicam (MOBIC) 15 MG tablet Take 1 tablet (15 mg total) by mouth daily. 30 tablet 5  . oxyCODONE (OXY IR/ROXICODONE) 5 MG immediate release tablet Take 1 tablet (5 mg total) by mouth every 4 (four) hours as needed for severe pain. 40 tablet 0  . pantoprazole (PROTONIX) 40 MG tablet TAKE ONE TABLET BY MOUTH ONCE DAILY 90 tablet 3  . pregabalin (LYRICA) 75 MG capsule Take 1 capsule (75 mg total) by mouth 3 (three) times daily. 90 capsule 5   No current facility-administered medications on file prior to visit.  Review of Systems  Constitutional: Negative for unusual diaphoresis or night sweats HENT: Negative for ringing in ear or discharge Eyes: Negative for double vision or worsening visual disturbance.  Respiratory: Negative for choking and stridor.   Gastrointestinal: Negative for vomiting or other signifcant bowel change Genitourinary: Negative for hematuria or change in urine volume.  Musculoskeletal: Negative for other MSK pain or swelling Skin: Negative for color change and worsening wound.  Neurological: Negative for tremors and numbness other than noted  Psychiatric/Behavioral: Negative for decreased concentration or agitation other than above       Objective:    Physical Exam BP 130/76 mmHg  Pulse 82  Temp(Src) 98.1 F (36.7 C) (Oral)  Ht 5\' 3"  (1.6 m)  Wt 227 lb (102.967 kg)  BMI 40.22 kg/m2  SpO2 96% VS noted, not ill appearing Constitutional: Pt appears in no significant distress HENT: Head: NCAT.  Right Ear: External ear normal.  Left Ear: External ear normal.  Eyes: . Pupils are equal, round, and reactive to light. Conjunctivae and EOM are normal Neck: Normal range of motion. Neck supple.  Cardiovascular: Normal rate and regular rhythm.   Pulmonary/Chest: Effort normal and breath sounds without rales or wheezing.  Neurological: Pt is alert. Not confused , motor grossly intact Skin: Skin is warm. No rash, no LE edema Psychiatric: Pt behavior is normal. No agitation. except somewhat tearful when talking about pain    Assessment & Plan:

## 2014-11-14 NOTE — Assessment & Plan Note (Signed)
To also start potassium 10 meq per day, compliance has been an issue with this pt

## 2014-11-14 NOTE — Assessment & Plan Note (Signed)
For mobic prn, also to see Dr Katrinka BlazingSmith Vernie Murders/sport med in this office but pt declnies at this time

## 2014-11-14 NOTE — Patient Instructions (Addendum)
Please take all new medication as prescribed - the voltaren gel  OK to try the indocin for the gout as needed  OK to stop the pravastatin  Please take all new medication as prescribed - the lipitor at 40 mg per day  Please continue all other medications as before, including the potassium pill  Please have the pharmacy call with any other refills you may need.  Please continue your efforts at being more active, low cholesterol diet, and weight control.  Please keep your appointments with your specialists as you may have planned  Please go to the LAB in the Basement (turn left off the elevator) for the tests to be done today  You will be contacted by phone if any changes need to be made immediately.  Otherwise, you will receive a letter about your results with an explanation, but please check with MyChart first.  Please remember to sign up for MyChart if you have not done so, as this will be important to you in the future with finding out test results, communicating by private email, and scheduling acute appointments online when needed.  Please return in 6 months, or sooner if needed, with Lab testing done 3-5 days before  Please consider making an appointment at your convenience for Dr Katrinka BlazingSmith in this office about the right knee

## 2014-11-14 NOTE — Telephone Encounter (Signed)
Pt was just in and she wanted to let you know she already got the flu and shingle vaccination

## 2014-11-14 NOTE — Progress Notes (Signed)
Pre visit review using our clinic review tool, if applicable. No additional management support is needed unless otherwise documented below in the visit note. 

## 2014-11-14 NOTE — Telephone Encounter (Signed)
noted 

## 2014-11-14 NOTE — Assessment & Plan Note (Signed)
stable overall by history and exam, recent data reviewed with pt, and pt to continue medical treatment as before,  to f/u any worsening symptoms or concerns Lab Results  Component Value Date   LDLCALC 116* 05/03/2014

## 2014-11-14 NOTE — Assessment & Plan Note (Signed)
Ok for volt gel prn,  to f/u any worsening symptoms or concerns 

## 2014-11-14 NOTE — Assessment & Plan Note (Addendum)
stable overall by history and exam, recent data reviewed with pt, and pt to continue medical treatment as before,  to f/u any worsening symptoms or concerns Lab Results  Component Value Date   HGBA1C 7.0* 05/03/2014   Note:  Total time for pt hx, exam, review of record with pt in the room, determination of diagnoses and plan for further eval and tx is > 40 min, with over 50% spent in coordination and counseling of patient

## 2014-11-14 NOTE — Assessment & Plan Note (Signed)
No overt joint effusions today, wears brace for right hand and knee, walks with cane, for indocin as per podiatry, consider alloupurinol as uric acid has been elevated

## 2014-11-15 ENCOUNTER — Other Ambulatory Visit: Payer: Self-pay | Admitting: Internal Medicine

## 2014-11-15 MED ORDER — POTASSIUM CHLORIDE ER 10 MEQ PO TBCR: 20.0000 meq | EXTENDED_RELEASE_TABLET | Freq: Every day | ORAL | Status: DC

## 2014-11-15 MED ORDER — ATORVASTATIN CALCIUM 80 MG PO TABS: 80.0000 mg | ORAL_TABLET | Freq: Every day | ORAL | Status: DC

## 2015-03-25 ENCOUNTER — Other Ambulatory Visit: Payer: Self-pay | Admitting: Internal Medicine

## 2015-03-25 ENCOUNTER — Other Ambulatory Visit: Payer: Self-pay | Admitting: Podiatry

## 2015-03-27 ENCOUNTER — Other Ambulatory Visit: Payer: Self-pay | Admitting: Podiatry

## 2015-05-15 ENCOUNTER — Ambulatory Visit: Payer: BLUE CROSS/BLUE SHIELD | Admitting: Internal Medicine

## 2015-05-15 DIAGNOSIS — Z0289 Encounter for other administrative examinations: Secondary | ICD-10-CM

## 2015-07-17 ENCOUNTER — Ambulatory Visit (INDEPENDENT_AMBULATORY_CARE_PROVIDER_SITE_OTHER): Payer: BLUE CROSS/BLUE SHIELD

## 2015-07-17 ENCOUNTER — Ambulatory Visit (INDEPENDENT_AMBULATORY_CARE_PROVIDER_SITE_OTHER): Payer: BLUE CROSS/BLUE SHIELD | Admitting: Physician Assistant

## 2015-07-17 VITALS — BP 144/88 | HR 86 | Temp 98.2°F | Resp 18 | Ht 63.0 in | Wt 220.0 lb

## 2015-07-17 DIAGNOSIS — R0989 Other specified symptoms and signs involving the circulatory and respiratory systems: Secondary | ICD-10-CM

## 2015-07-17 DIAGNOSIS — R062 Wheezing: Secondary | ICD-10-CM

## 2015-07-17 LAB — POCT CBC
GRANULOCYTE PERCENT: 59.6 % (ref 37–80)
HCT, POC: 38 % (ref 37.7–47.9)
Hemoglobin: 12.8 g/dL (ref 12.2–16.2)
Lymph, poc: 2 (ref 0.6–3.4)
MCH: 28.1 pg (ref 27–31.2)
MCHC: 33.8 g/dL (ref 31.8–35.4)
MCV: 83.1 fL (ref 80–97)
MID (cbc): 0.4 (ref 0–0.9)
MPV: 9.3 fL (ref 0–99.8)
PLATELET COUNT, POC: 185 10*3/uL (ref 142–424)
POC Granulocyte: 3.6 (ref 2–6.9)
POC LYMPH PERCENT: 33.6 %L (ref 10–50)
POC MID %: 6.8 %M (ref 0–12)
RBC: 4.57 M/uL (ref 4.04–5.48)
RDW, POC: 13.3 %
WBC: 6 10*3/uL (ref 4.6–10.2)

## 2015-07-17 MED ORDER — LORATADINE 10 MG PO TABS: 10.0000 mg | ORAL_TABLET | Freq: Every day | ORAL | Status: DC

## 2015-07-17 MED ORDER — ALBUTEROL SULFATE (2.5 MG/3ML) 0.083% IN NEBU
2.5000 mg | INHALATION_SOLUTION | Freq: Once | RESPIRATORY_TRACT | Status: AC
  Administered 2015-07-17: 2.5 mg via RESPIRATORY_TRACT

## 2015-07-17 MED ORDER — HYDROCODONE-HOMATROPINE 5-1.5 MG/5ML PO SYRP: 2.5000 mL | ORAL_SOLUTION | Freq: Every evening | ORAL | Status: DC | PRN

## 2015-07-17 MED ORDER — IPRATROPIUM BROMIDE 0.02 % IN SOLN
0.5000 mg | Freq: Once | RESPIRATORY_TRACT | Status: AC
Start: 2015-07-17 — End: 2015-07-17
  Administered 2015-07-17: 0.5 mg via RESPIRATORY_TRACT

## 2015-07-17 NOTE — Progress Notes (Signed)
07/17/2015 9:46 AM   DOB: [birth year] / MRN: [id number]  SUBJECTIVE:  Alice Hickman is a 65 y.o. female presenting for chest and nasal congestion that started three days ago.  Associates generalized fatigue. Feels that she is getting worse.  She has a history of well controlled diabetes and HTN.  She did not take her BP medication today.  She does have seasonal allergies and denies a history of asthma.   She is allergic to lovastatin; metformin; and penicillins.   She  has a past medical history of DM w/o Complication Type II (11/15/2006); HYPERLIPIDEMIA (05/01/2007); HYPOKALEMIA (10/02/2008); ANXIETY, SITUATIONAL (09/02/2009); CARPAL TUNNEL SYNDROME, RIGHT (11/15/2006); GLAUCOMA (07/04/2008); HYPERTENSION (11/15/2006); ALLERGIC RHINITIS (05/01/2007); GERD (11/15/2006); ANKLE PAIN, RIGHT (09/02/2009); THUMB PAIN, RIGHT (04/02/2009); OSTEOPENIA (09/02/2009); Low back pain; Generalized osteoarthritis (10/27/2012); Peripheral neuropathy (HCC) (11/14/2014); and Gout (11/14/2014).    She  reports that she has never smoked. She does not have any smokeless tobacco history on file. She reports that she does not drink alcohol or use illicit drugs. She  has no sexual activity history on file. The patient  has past surgical history that includes Tonsillectomy and right shoulder sugury.  Her family history includes Cancer in her brother and other; Heart disease in her other; Hypertension in her other.  Review of Systems  Constitutional: Negative for fever and chills.  HENT: Positive for congestion and sore throat.   Respiratory: Positive for cough. Negative for hemoptysis and shortness of breath.   Cardiovascular: Negative for chest pain.  Gastrointestinal: Negative for nausea.  Skin: Negative for rash.  Neurological: Negative for dizziness and headaches.    Problem list and medications reviewed and updated by myself where necessary, and exist elsewhere in the encounter.   OBJECTIVE:  BP 144/88 mmHg  Pulse 86   Temp(Src) 98.2 F (36.8 C) (Oral)  Resp 18  Ht 5\' 3"  (1.6 m)  Wt 220 lb (99.791 kg)  BMI 38.98 kg/m2  SpO2 96%  Lab Results  Component Value Date   HGBA1C 6.6* 11/14/2014     Physical Exam  Constitutional: She is oriented to person, place, and time. She appears well-developed.  Eyes: EOM are normal. Pupils are equal, round, and reactive to light.  Cardiovascular: Normal rate and regular rhythm.   Pulmonary/Chest: Effort normal. No respiratory distress. She has wheezes (diffuse, right sided). She has no rales. She exhibits no tenderness.  Abdominal: She exhibits no distension.  Musculoskeletal: Normal range of motion.  Neurological: She is alert and oriented to person, place, and time. No cranial nerve deficit.  Skin: Skin is warm and dry. She is not diaphoretic.  Psychiatric: She has a normal mood and affect.  Vitals reviewed.   Results for orders placed or performed in visit on 07/17/15 (from the past 72 hour(s))  POCT CBC     Status: None   Collection Time: 07/17/15  9:25 AM  Result Value Ref Range   WBC 6.0 4.6 - 10.2 K/uL   Lymph, poc 2.0 0.6 - 3.4   POC LYMPH PERCENT 33.6 10 - 50 %L   MID (cbc) 0.4 0 - 0.9   POC MID % 6.8 0 - 12 %M   POC Granulocyte 3.6 2 - 6.9   Granulocyte percent 59.6 37 - 80 %G   RBC 4.57 4.04 - 5.48 M/uL   Hemoglobin 12.8 12.2 - 16.2 g/dL   HCT, POC 86.538.0 78.437.7 - 47.9 %   MCV 83.1 80 - 97 fL   MCH, POC 28.1 27 -  31.2 pg   MCHC 33.8 31.8 - 35.4 g/dL   RDW, POC 16.1 %   Platelet Count, POC 185 142 - 424 K/uL   MPV 9.3 0 - 99.8 fL    Dg Chest 2 View  07/17/2015  CLINICAL DATA:  Chest congestion.  Productive cough. EXAM: CHEST  2 VIEW COMPARISON:  01/21/2005 FINDINGS: Mild cardiac enlargement. No pleural effusion or edema. No focal lung opacities. Mild multi level degenerative disc disease noted within the thoracic spine. IMPRESSION: No active cardiopulmonary disease. Electronically Signed   By: Signa Kell M.D.   On: 07/17/2015 09:19     ASSESSMENT AND PLAN  Vickey was seen today for cough, sore throat and nasal congestion.  Diagnoses and all orders for this visit:  Chest congestion: Rads negative. This is likely viral or allergic in nature.  Will treat symptomatically.  Follow up in 3-4 days if not improved.  -     DG Chest 2 View; Future  Wheezing -     POCT CBC -     albuterol (PROVENTIL) (2.5 MG/3ML) 0.083% nebulizer solution 2.5 mg; Take 3 mLs (2.5 mg total) by nebulization once. -     ipratropium (ATROVENT) nebulizer solution 0.5 mg; Take 2.5 mLs (0.5 mg total) by nebulization once.  Other orders -     HYDROcodone-homatropine (HYCODAN) 5-1.5 MG/5ML syrup; Take 2.5-5 mLs by mouth at bedtime as needed. -     loratadine (CLARITIN) 10 MG tablet; Take 1 tablet (10 mg total) by mouth daily.    The patient was advised to call or return to clinic if she does not see an improvement in symptoms or to seek the care of the closest emergency department if she worsens with the above plan.   Deliah Boston, MHS, PA-C [facility] 07/17/2015 9:46 AM

## 2015-07-17 NOTE — Addendum Note (Signed)
Addended by: Matt HolmesGAISER, Jashun Puertas B on: 07/17/2015 09:51 AM   Modules accepted: Kipp BroodSmartSet

## 2015-07-17 NOTE — Addendum Note (Signed)
Addended by: Eon Zunker B on: 07/17/2015 09:51 AM   Modules accepted: SmartSet  

## 2015-07-17 NOTE — Addendum Note (Signed)
Addended by: Matt HolmesGAISER, Natsuko Kelsay B on: 07/17/2015 10:23 AM   Modules accepted: Kipp BroodSmartSet

## 2015-07-17 NOTE — Addendum Note (Signed)
Addended by: Matt HolmesGAISER, Braylei Totino B on: 07/17/2015 11:19 AM   Modules accepted: Kipp BroodSmartSet

## 2015-07-17 NOTE — Patient Instructions (Addendum)
Your chest xray is normal for you and your blood points away from pneumonia.  Please take the medications I have prescribed.  Take 1000 mg of tylenol every 8 hours in addition to what I have prescribed. If your symptoms fail to improve in 3-4 days please call.     IF you received an x-ray today, you will receive an invoice from [facility]. Please contact [facility] at [phone] with questions or concerns regarding your invoice.   IF you received labwork today, you will receive an invoice from [facility]. Please contact [facility] at [phone] with questions or concerns regarding your invoice.   Our billing staff will not be able to assist you with questions regarding bills from these companies.  You will be contacted with the lab results as soon as they are available. The fastest way to get your results is to activate your My Chart account. Instructions are located on the last page of this paperwork. If you have not heard from us regarding the results in 2 weeks, please contact this office.    We recommend that you schedule a mammogram for breast cancer screening. Typically, you do not need a referral to do this. Please contact a local imaging center to schedule your mammogram.  [facility] - [phone]) [phone]  *ask for the Radiology Department [facility] - [phone] or [phone]) [phone]  [facility] - [phone]) [phone] [facility] - [phone]) [phone] [facility] - [phone]) [phone]  *ask for the Radiology Department [facility] - [phone]  *ask for the Radiology Department [facility] - [phone]  *ask for the Mammography Department [facility] - [phone]) [phone]

## 2015-08-05 ENCOUNTER — Other Ambulatory Visit: Payer: Self-pay | Admitting: Internal Medicine

## 2015-08-07 ENCOUNTER — Other Ambulatory Visit: Payer: Self-pay | Admitting: *Deleted

## 2015-08-07 MED ORDER — PREGABALIN 75 MG PO CAPS: 75.0000 mg | ORAL_CAPSULE | Freq: Three times a day (TID) | ORAL | Status: DC

## 2015-08-07 NOTE — Progress Notes (Signed)
[facility] sent a refill request for Lyrica.  Dr. Maren BeachHyatt okayed the refill but patient needs to be seen before any further refills.  Last office visit was in August 2016.

## 2015-09-09 ENCOUNTER — Ambulatory Visit (INDEPENDENT_AMBULATORY_CARE_PROVIDER_SITE_OTHER): Payer: BLUE CROSS/BLUE SHIELD | Admitting: Podiatry

## 2015-09-09 ENCOUNTER — Encounter: Payer: Self-pay | Admitting: Podiatry

## 2015-09-09 DIAGNOSIS — E1142 Type 2 diabetes mellitus with diabetic polyneuropathy: Secondary | ICD-10-CM | POA: Diagnosis not present

## 2015-09-09 DIAGNOSIS — M722 Plantar fascial fibromatosis: Secondary | ICD-10-CM

## 2015-09-09 DIAGNOSIS — M2011 Hallux valgus (acquired), right foot: Secondary | ICD-10-CM | POA: Diagnosis not present

## 2015-09-09 NOTE — Progress Notes (Signed)
She presents today for follow-up of her diabetic peripheral neuropathy. She states that the Lyrica that really has not helped any of her neuropathy at all she states that she quit taking the Lyrica once she ran out of it has been run out of for some quite some time now. And has noticed no changes in her feet. She does relate pain about the right heel.  Objective: Vital signs are stable alert and oriented 3. Pulses are palpable. She has pain on palpation medial tubercle and the right heel today. No open lesions or wounds are noted.  Assessment: Plantar fasciitis right foot.  Plan: Discussed etiology pathology conservative versus surgical therapies. Discussed the possible correction of bunion deformity. I injected the right heel today with Kenalog and local anesthetic and we'll follow-up with her in a few weeks for reevaluation.

## 2015-10-07 ENCOUNTER — Ambulatory Visit: Payer: BLUE CROSS/BLUE SHIELD | Admitting: *Deleted

## 2015-10-07 DIAGNOSIS — R52 Pain, unspecified: Secondary | ICD-10-CM

## 2015-10-07 NOTE — Patient Instructions (Signed)

## 2015-10-07 NOTE — Progress Notes (Signed)
Patient ID: Alice Hickman, female   DOB: [birth year], 65 y.o.   MRN: [id number]  Patient presents for orthotic pick up.  Verbal and written break in and wear instructions given.  Patient will follow up in 4 weeks if symptoms worsen or fail to improve.

## 2015-11-05 ENCOUNTER — Other Ambulatory Visit: Payer: Self-pay | Admitting: Internal Medicine

## 2015-12-05 ENCOUNTER — Encounter: Payer: BLUE CROSS/BLUE SHIELD | Admitting: Internal Medicine

## 2016-05-11 ENCOUNTER — Other Ambulatory Visit: Payer: Self-pay | Admitting: Internal Medicine

## 2016-09-30 ENCOUNTER — Ambulatory Visit (INDEPENDENT_AMBULATORY_CARE_PROVIDER_SITE_OTHER): Payer: BLUE CROSS/BLUE SHIELD

## 2016-09-30 ENCOUNTER — Ambulatory Visit (INDEPENDENT_AMBULATORY_CARE_PROVIDER_SITE_OTHER): Payer: BLUE CROSS/BLUE SHIELD | Admitting: Podiatry

## 2016-09-30 DIAGNOSIS — M722 Plantar fascial fibromatosis: Secondary | ICD-10-CM

## 2016-09-30 DIAGNOSIS — M7661 Achilles tendinitis, right leg: Secondary | ICD-10-CM

## 2016-09-30 MED ORDER — MELOXICAM 15 MG PO TABS: 15.0000 mg | ORAL_TABLET | Freq: Every day | ORAL | 3 refills | Status: DC

## 2016-09-30 NOTE — Progress Notes (Signed)
She presents today with a chief complaint of stabbing pain in her right heel when walking or driving. She states that recently injured her back at work. She states that she's been going to therapy for the back pain but now that she has noticed the heel hurting she's wondering if it was from her injury at work. She states that she was standing on a stool and stool slipped out from under her and she fell to the ground twisting and hitting her right side.  Objective: Vital signs are stable she is alert and oriented 3. Pulses are strongly palpable. Neurologic sensorium is intact. Deep tendon reflexes are intact muscle strength +5 over 5 dorsiflexion plantar flexors and inverters and evertors onto the musculature is intact. Orthopedic evaluation and states all joints distal to the angle full range of motion with crepitation. She has severe pain on palpation of the Achilles tendon in the right heel and pain on medial and lateral compression of the calcaneus. Radiographs taken in the office today do not demonstrate a fracture of the calcaneus however does demonstrate a significant thickening of the Achilles tendon near its insertion site on the retrocalcaneal heel spur. There appears to be a lot of swelling in the area of tenderness.  Assessment: Cannot rule out a Achilles tendon tear and plantar fasciitis.  Plan: We will place her in a Cam Walker and a night splint on  meloxicam. I will follow-up with her in a couple weeks.

## 2016-11-02 ENCOUNTER — Ambulatory Visit: Payer: BLUE CROSS/BLUE SHIELD | Admitting: Podiatry

## 2016-11-08 ENCOUNTER — Other Ambulatory Visit: Payer: Self-pay | Admitting: Family

## 2016-11-08 DIAGNOSIS — Z1231 Encounter for screening mammogram for malignant neoplasm of breast: Secondary | ICD-10-CM

## 2016-11-09 ENCOUNTER — Ambulatory Visit (INDEPENDENT_AMBULATORY_CARE_PROVIDER_SITE_OTHER): Payer: BLUE CROSS/BLUE SHIELD | Admitting: Podiatry

## 2016-11-09 DIAGNOSIS — E1142 Type 2 diabetes mellitus with diabetic polyneuropathy: Secondary | ICD-10-CM | POA: Diagnosis not present

## 2016-11-09 DIAGNOSIS — M7661 Achilles tendinitis, right leg: Secondary | ICD-10-CM

## 2016-11-09 DIAGNOSIS — M722 Plantar fascial fibromatosis: Secondary | ICD-10-CM

## 2016-11-10 ENCOUNTER — Telehealth: Payer: Self-pay | Admitting: *Deleted

## 2016-11-10 DIAGNOSIS — M7661 Achilles tendinitis, right leg: Secondary | ICD-10-CM

## 2016-11-10 IMAGING — DX DG FOOT COMPLETE 3+V*R*
3 series · 3 of 3 positions shown · non-contrast
Comparison: 11/15/2013

CLINICAL DATA: Slipped getting out of tub on [REDACTED] night striking
corner of tub with foot, pain at RIGHT great and second toes, unable
to bend, swelling, redness, bruising

EXAM:
RIGHT FOOT COMPLETE - 3+ VIEW

[foot ap]
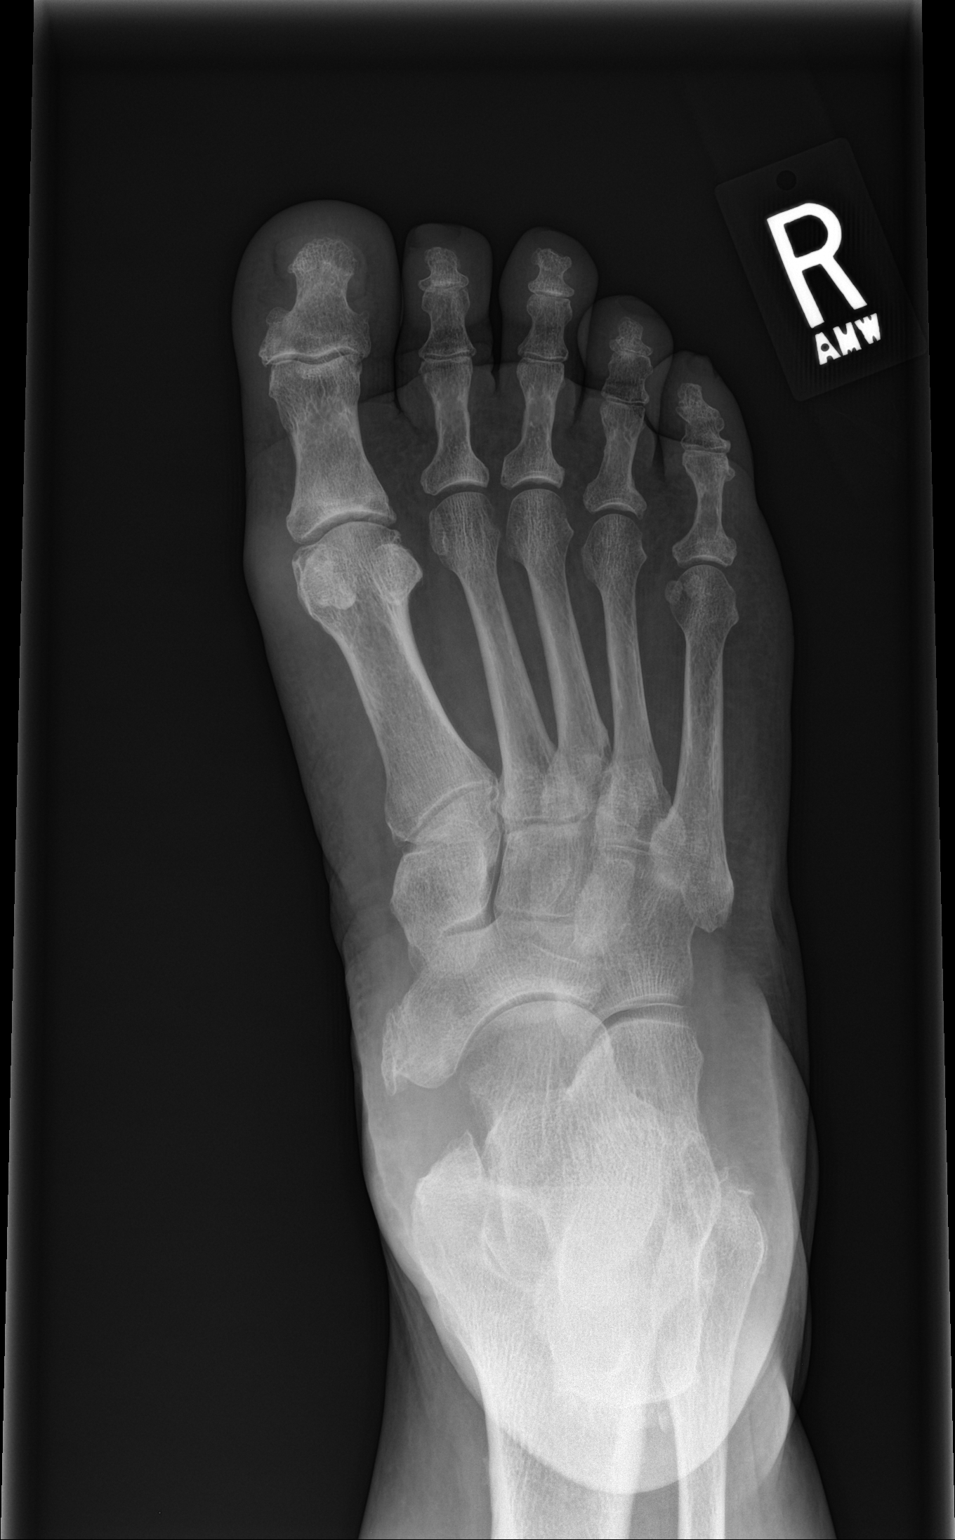

[foot obl]
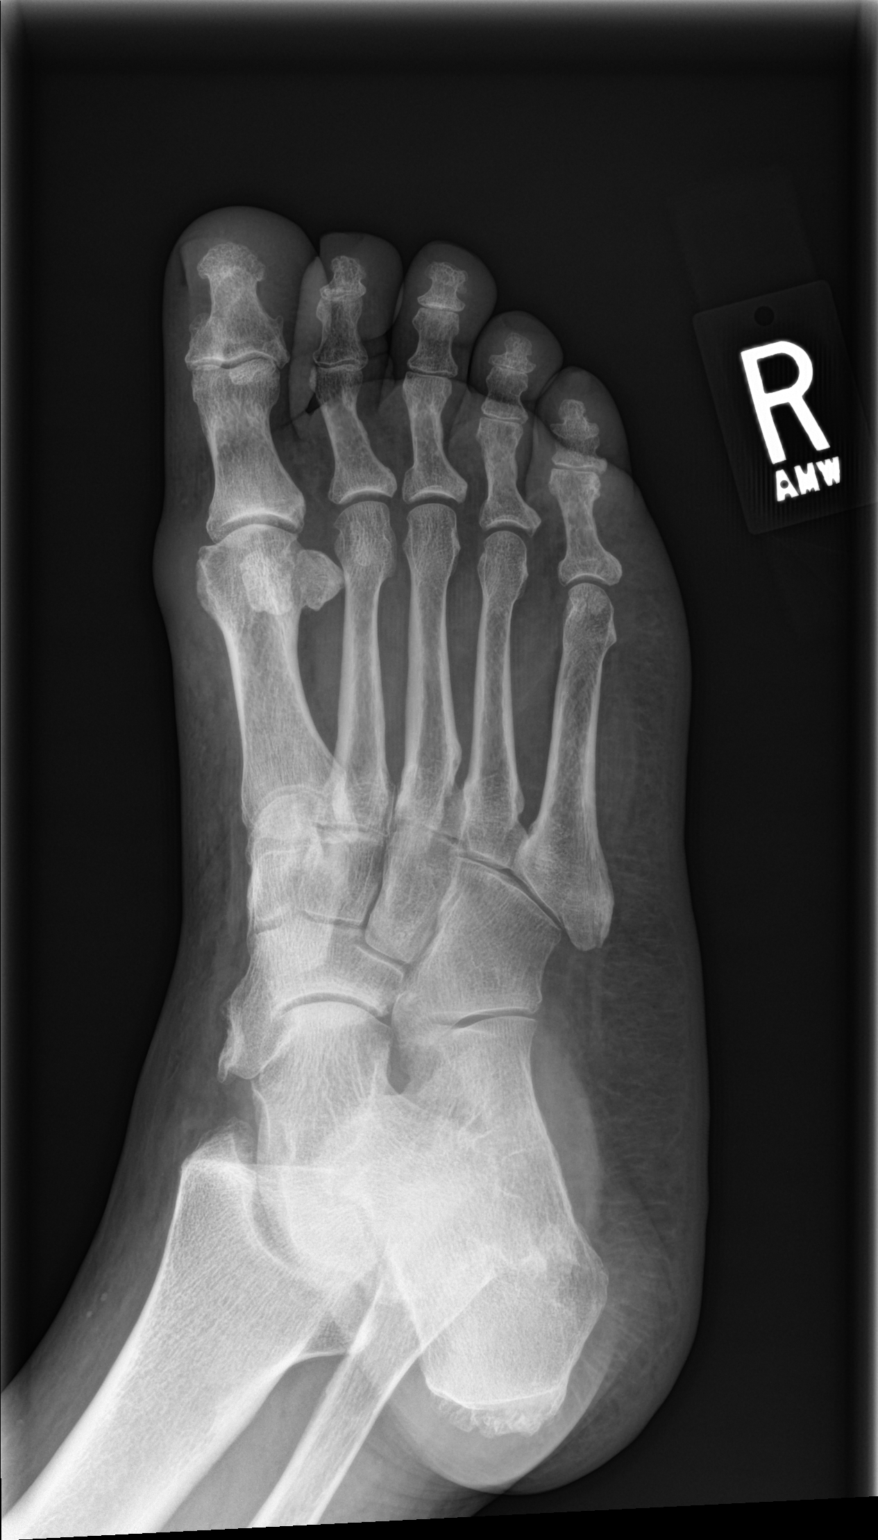

[foot lat]
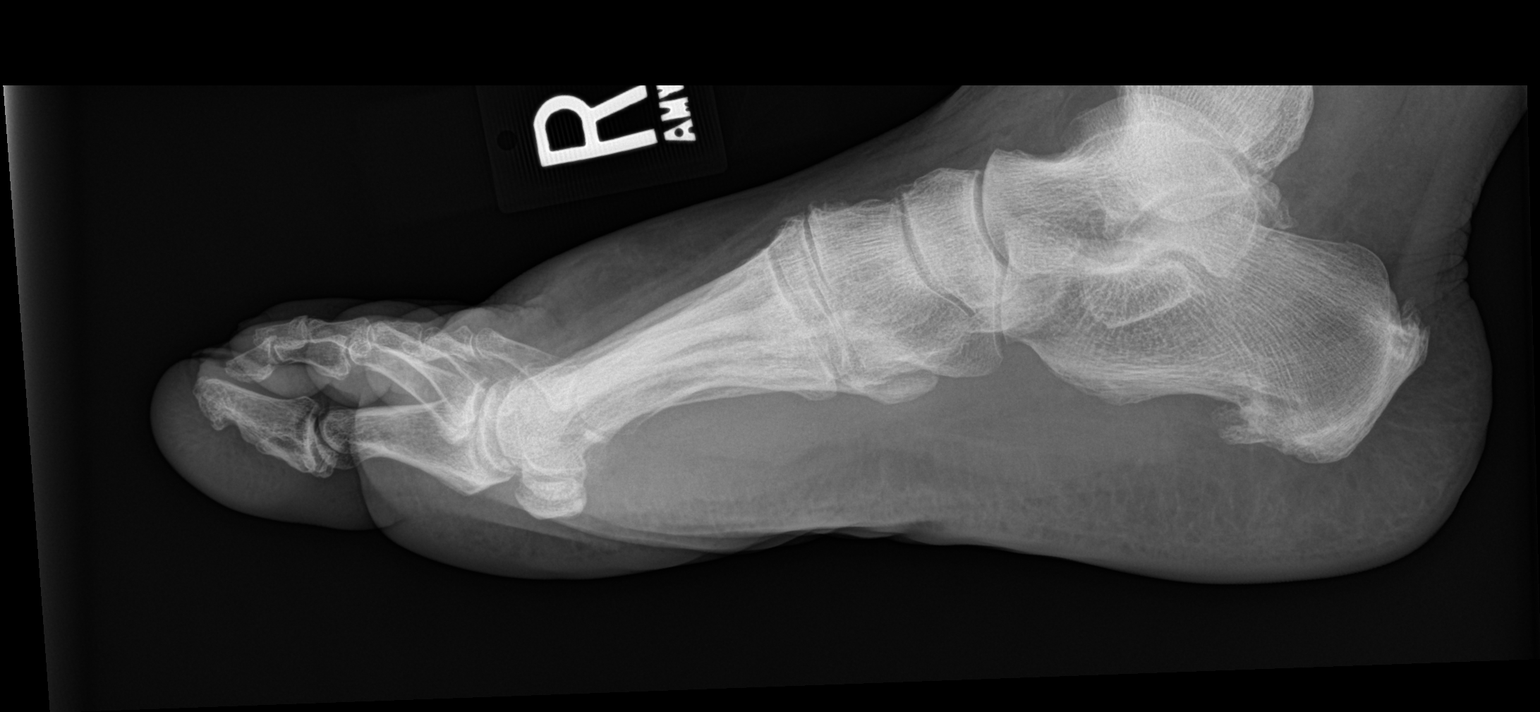

[3 of 3 positions shown; findings below may reference images not displayed]

FINDINGS: Question mild osseous demineralization.

Mild degenerative changes at TMT joints dorsally.

Joint spaces otherwise preserved.

Plantar and Achilles insertion calcaneal spur formation.

No acute fracture, dislocation or bone destruction.

Dorsal soft tissue swelling of foot overlying the distal
metatarsals.
IMPRESSION: Calcaneal spurring.

No acute osseous abnormalities.

## 2016-11-10 NOTE — Telephone Encounter (Signed)
-----   Message from Kristian Covey, [facility] sent at 11/09/2016  4:38 PM EDT ----- Regarding: MRI MRI rearfoot right - evaluate achilles tendon tear at insertion right - surgical consideration

## 2016-11-10 NOTE — Progress Notes (Signed)
She presents today for follow-up of pain to the right foot. States that it feels a little better and the injection seemed to help. States that she wears her boot at work.  Objective: Vital signs are stable she is alert and oriented 3. Pulses are palpable. She still has severe pain on palpation of the Achilles at its insertion site on the right heel. She also has pain on palpation of the medial band of the plantar fascia of the right foot.  Assessment: This is not resolved enough of the past few weeks with medications and immobilization. She still has considerable insertional Achilles tendinitis and plantar fasciitis.  Plan: At this point requesting MRI for surgical consideration.

## 2016-11-25 ENCOUNTER — Ambulatory Visit: Payer: BLUE CROSS/BLUE SHIELD

## 2016-11-29 ENCOUNTER — Ambulatory Visit
Admission: RE | Admit: 2016-11-29 | Discharge: 2016-11-29 | Disposition: A | Discharge status: Home / Self Care | Payer: BLUE CROSS/BLUE SHIELD | Source: Ambulatory Visit | Attending: Family | Admitting: Family

## 2016-11-29 DIAGNOSIS — Z1231 Encounter for screening mammogram for malignant neoplasm of breast: Secondary | ICD-10-CM

## 2016-12-05 ENCOUNTER — Other Ambulatory Visit: Payer: BLUE CROSS/BLUE SHIELD

## 2016-12-14 ENCOUNTER — Ambulatory Visit: Payer: BLUE CROSS/BLUE SHIELD | Admitting: Podiatry

## 2016-12-16 ENCOUNTER — Other Ambulatory Visit: Payer: Self-pay | Admitting: Physical Medicine and Rehabilitation

## 2016-12-16 DIAGNOSIS — M545 Low back pain: Secondary | ICD-10-CM

## 2016-12-30 ENCOUNTER — Other Ambulatory Visit: Payer: BLUE CROSS/BLUE SHIELD

## 2017-03-27 ENCOUNTER — Encounter (HOSPITAL_COMMUNITY): Payer: Self-pay | Admitting: Emergency Medicine

## 2017-03-27 ENCOUNTER — Emergency Department (HOSPITAL_BASED_OUTPATIENT_CLINIC_OR_DEPARTMENT_OTHER)
Admit: 2017-03-27 | Discharge: 2017-03-27 | Disposition: A | Discharge status: Home / Self Care | Payer: BLUE CROSS/BLUE SHIELD | Attending: Emergency Medicine | Admitting: Emergency Medicine

## 2017-03-27 ENCOUNTER — Emergency Department (HOSPITAL_COMMUNITY)
Admission: EM | Admit: 2017-03-27 | Discharge: 2017-03-27 | Disposition: A | Discharge status: Home / Self Care | Payer: BLUE CROSS/BLUE SHIELD | Attending: Emergency Medicine | Admitting: Emergency Medicine

## 2017-03-27 DIAGNOSIS — M79609 Pain in unspecified limb: Secondary | ICD-10-CM

## 2017-03-27 DIAGNOSIS — R2232 Localized swelling, mass and lump, left upper limb: Secondary | ICD-10-CM | POA: Insufficient documentation

## 2017-03-27 DIAGNOSIS — Z79899 Other long term (current) drug therapy: Secondary | ICD-10-CM | POA: Insufficient documentation

## 2017-03-27 DIAGNOSIS — E119 Type 2 diabetes mellitus without complications: Secondary | ICD-10-CM | POA: Diagnosis not present

## 2017-03-27 DIAGNOSIS — M25511 Pain in right shoulder: Secondary | ICD-10-CM | POA: Diagnosis not present

## 2017-03-27 DIAGNOSIS — I10 Essential (primary) hypertension: Secondary | ICD-10-CM | POA: Insufficient documentation

## 2017-03-27 DIAGNOSIS — M25512 Pain in left shoulder: Secondary | ICD-10-CM | POA: Diagnosis not present

## 2017-03-27 DIAGNOSIS — Z7982 Long term (current) use of aspirin: Secondary | ICD-10-CM | POA: Insufficient documentation

## 2017-03-27 MED ORDER — ACETAMINOPHEN 325 MG PO TABS
650.0000 mg | ORAL_TABLET | Freq: Once | ORAL | Status: AC
  Administered 2017-03-27: 650 mg via ORAL
  Filled 2017-03-27: qty 2

## 2017-03-27 MED ORDER — IBUPROFEN 600 MG PO TABS: 600.0000 mg | ORAL_TABLET | Freq: Four times a day (QID) | ORAL | 0 refills | Status: DC | PRN

## 2017-03-27 MED ORDER — METHOCARBAMOL 500 MG PO TABS: 500.0000 mg | ORAL_TABLET | Freq: Every evening | ORAL | 0 refills | Status: DC | PRN

## 2017-03-27 MED ORDER — ACETAMINOPHEN 325 MG PO TABS: 650.0000 mg | ORAL_TABLET | Freq: Four times a day (QID) | ORAL | 0 refills | Status: DC | PRN

## 2017-03-27 NOTE — ED Triage Notes (Signed)
Pt reports right shoulder pain x2-3 months, states went through rehab but the pain is not getting better. Reports little sleep at night due to pain. Tearful in triage. Denies recent injury

## 2017-03-27 NOTE — Progress Notes (Signed)
VASCULAR LAB PRELIMINARY  PRELIMINARY  PRELIMINARY  PRELIMINARY  Left upper extremity venous duplex completed.    Preliminary report:  There is no DVT or SVT noted in the left upper extremity or in the right internal jugular or subclavian veins.   Attempted to Called report to [employer]Cortni Couture, PA-C at 11:40 and 11:50, no answer  Roderic Lammert, RVT 03/27/2017, 11:41 AM

## 2017-03-27 NOTE — ED Provider Notes (Signed)
[facility] EMERGENCY DEPARTMENT Provider Note   CSN: [id number] Arrival date & time: 03/27/17  4098     History   Chief Complaint Chief Complaint  Patient presents with  . Shoulder Pain    HPI Alice Hickman is a 67 y.o. female.  HPI   67 y/o female presenting to day c/o 10/10 stabbing bilat trapezius pain that has been ongoing for about 3 weeks. She states that her pain is worse when she is sleeping and she moves her arms. Espeicially worse when lifting arms above head.  She also reports that pain is worse to the left shoulder and that she has had swelling to the left upper extremity.  Denies any redness to the upper extremity. No recent falls or trauma. Has been taking ibuprofen for her symptoms with mild relief. Has not tried other therapies. Denies tingling, numbness, weakness, to the bilateral extremities. 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  She denies chest pain, shortness of breath, fevers, or any other symptoms. Fell 4 months ago onto right shoulder and was evaluated by employee health at that time. Was seen at [facility] orthopedic and had physical therapy.  States she completed physical therapy about 1 month ago.  Past Medical History:  Diagnosis Date  . ALLERGIC RHINITIS 05/01/2007  . Allergy   . ANKLE PAIN, RIGHT 09/02/2009  . ANXIETY, SITUATIONAL 09/02/2009  . CARPAL TUNNEL SYNDROME, RIGHT 11/15/2006  . DM w/o Complication Type II 11/15/2006  . Generalized osteoarthritis 10/27/2012   bilat ankles, knees, hands and wrists  . GERD 11/15/2006  . GLAUCOMA 07/04/2008  . Gout 11/14/2014  . HYPERLIPIDEMIA 05/01/2007  . HYPERTENSION 11/15/2006  . HYPOKALEMIA 10/02/2008  . Low back pain   . OSTEOPENIA 09/02/2009  . Peripheral neuropathy 11/14/2014  . THUMB PAIN, RIGHT 04/02/2009    Patient Active Problem List   Diagnosis Date Noted  . Peripheral neuropathy  11/14/2014  . Gout 11/14/2014  . Right foot pain 07/18/2014  . Cellulitis of left foot 07/18/2014  . Hypokalemia 05/08/2014  . Pes planus of both feet 05/01/2013  . Osteoarthritis of both knees 12/01/2012  . Swelling of both ankles 10/31/2012  . Generalized osteoarthritis 10/27/2012  . Recurrent falls 09/11/2012  . ANXIETY, SITUATIONAL 09/02/2009  . OSTEOPENIA 09/02/2009  . GLAUCOMA 07/04/2008  . Hyperlipidemia 05/01/2007  . ALLERGIC RHINITIS 05/01/2007  . 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 CARPAL TUNNEL SYNDROME, RIGHT 11/15/2006  . Essential hypertension 11/15/2006  . GERD 11/15/2006    Past Surgical History:  Procedure Laterality Date  . right shoulder sugury    . TONSILLECTOMY      OB History    No data available       Home Medications    Prior to Admission medications   Medication Sig Start Date End Date Taking? Authorizing Provider  acetaminophen (TYLENOL) 325 MG tablet Take 2 tablets (650 mg total) by mouth every 6 (six) hours as needed. Do not take more than 4000mg  of tylenol per day 03/27/17   Keelen Quevedo S, PA-C  aspirin 81 MG tablet Take 81 mg by mouth daily.      [provider]  fish oil-omega-3 fatty acids 1000 MG capsule Take 2 g by mouth daily.    [provider]  glimepiride (AMARYL) 2 MG tablet TAKE ONE TABLET BY MOUTH ONCE DAILY BEFORE BREAKFAST 11/06/15  Corwin Levins, MD  HYDROcodone-homatropine [facility]) 5-1.5 MG/5ML syrup Take 2.5-5 mLs by mouth at bedtime as needed. 07/17/15   Ofilia Neas, PA-C  ibuprofen (ADVIL,MOTRIN) 600 MG tablet Take 1 tablet (600 mg total) by mouth every 6 (six) hours as needed. 03/27/17   Monicka Cyran S, PA-C  loratadine (CLARITIN) 10 MG tablet Take 1 tablet (10 mg total) by mouth daily. 07/17/15   Ofilia Neas, PA-C  meloxicam (MOBIC) 15 MG tablet Take 1 tablet (15 mg total) by mouth daily. 09/30/16   Hyatt, Max T, DPM  methocarbamol (ROBAXIN) 500 MG tablet Take 1 tablet (500  mg total) by mouth at bedtime as needed for muscle spasms. 03/27/17   Rhiana Morash S, PA-C  olmesartan-hydrochlorothiazide (BENICAR HCT) 40-25 MG tablet TAKE ONE TABLET BY MOUTH ONCE DAILY. 11/06/15   Corwin Levins, MD  pregabalin (LYRICA) 75 MG capsule Take 1 capsule (75 mg total) by mouth 3 (three) times daily. 08/07/15   Hyatt, Max T, DPM    Family History Family History  Problem Relation Age of Onset  . Heart disease Mother   . Hypertension Mother   . Cancer Brother        throat cancer, smoker  . Heart disease Other   . Hypertension Other   . Cancer Other        lung cancer  . Cancer Brother     Social History Social History   Tobacco Use  . Smoking status: Never Smoker  . Smokeless tobacco: Never Used  Substance Use Topics  . Alcohol use: No  . Drug use: No     Allergies   Lovastatin; Metformin; and Penicillins   Review of Systems Review of Systems  Constitutional: Negative for fever.  Eyes: Negative for photophobia.  Respiratory: Negative for shortness of breath.   Cardiovascular: Negative for chest pain.  Gastrointestinal: Negative for abdominal pain.  Genitourinary: Negative for pelvic pain.  Musculoskeletal:       Bilat trap pain, l>r, left arm swelling  Skin: Negative for color change.     Physical Exam Updated Vital Signs BP (!) 158/75 (BP Location: Right Arm)   Pulse 92   Temp 98.5 F (36.9 C) (Oral)   Resp 16   Ht 5\' 3"  (1.6 m)   Wt 102.1 kg (225 lb)   SpO2 97%   BMI 39.86 kg/m   Physical Exam  Constitutional: She appears well-developed and well-nourished.  HENT:  Head: Normocephalic and atraumatic.  Eyes: Conjunctivae are normal.  Neck: Neck supple.  Cardiovascular: Normal rate, regular rhythm, normal heart sounds and intact distal pulses.  Pulmonary/Chest: Effort normal.  Abdominal: Soft. There is no tenderness.  Musculoskeletal:  Bilateral trapezius muscles are tender to palpation.  Also tenderness to the anterior and lateral  portions of the left shoulder.  No C-spine tenderness or paraspinous tenderness in the cervical area.  No thoracic or lumbar midline tenderness.  Positive crossover bilaterally.  Negative empty can test.  Swelling noted to the left upper extremity, no edema or redness.  Distal pulses intact.  Normal sensation.  5/5 strength to the bilateral upper extremities with shoulder flexion, abduction/abduction, internal/external rotation, elbow flexion/extension, wrist flexion extension, finger abduction, pincer strength, grip strength, thumb extension.  Neurological: She is alert.  Skin: Skin is warm and dry.  Psychiatric:  Tearful on exam.  Nursing note and vitals reviewed.    ED Treatments / Results  Labs (all labs ordered are listed, but only abnormal results are displayed) Labs  Reviewed - No data to display  EKG  EKG Interpretation None       Radiology No results found.  Procedures Procedures (including critical care time)  Medications Ordered in ED Medications  acetaminophen (TYLENOL) tablet 650 mg (650 mg Oral Given 03/27/17 1211)     Initial Impression / Assessment and Plan / ED Course  I have reviewed the triage vital signs and the nursing notes.  Pertinent labs & imaging results that were available during my care of the patient were reviewed by me and considered in my medical decision making (see chart for details).     Final Clinical Impressions(s) / ED Diagnoses   Final diagnoses:  Bilateral shoulder pain, unspecified chronicity   67 year old female presented to ED complaining of bilateral trapezius pain, worse on the left, as well as left arm swelling.  No chest pain or shortness of breath.  Vital signs stable here, with mild hypertension which is chronic for patient.  Afebrile.  Patient has tenderness over the bilateral trapezius muscles as well as the left shoulder.  Suspect this is likely just muscular skeletal however given swelling will order left upper extremity  ultrasound to rule out DVT.    Left upper extremity ultrasound negative for DVT or SVT.  Patient feels better after Tylenol. Vss. NAD.  Will discharge with anti-inflammatories, rice protocol, and instructions to follow-up with her regular orthopedist.  Also advised follow-up with PCP if she cannot get into orthopedics.  Return precautions given and patient understands the plan for discharge.  All questions were answered.  ED Discharge Orders        Ordered    acetaminophen (TYLENOL) 325 MG tablet  Every 6 hours PRN     03/27/17 1207    ibuprofen (ADVIL,MOTRIN) 600 MG tablet  Every 6 hours PRN     03/27/17 1207    methocarbamol (ROBAXIN) 500 MG tablet  At bedtime PRN     03/27/17 1207       Karrie MeresCouture, Geetika Laborde S, PA-C 03/27/17 [id number]    Cathren LaineSteinl, Kevin, MD 04/01/17 2235

## 2017-03-27 NOTE — Discharge Instructions (Signed)
You may alternate taking Tylenol and Ibuprofen as needed for pain control. You may take 400-600 mg of ibuprofen every 6 hours and [phone] mg of Tylenol every 6 hours. Do not exceed 4000 mg of Tylenol daily as this can lead to liver damage. Also, make sure to take Ibuprofen with meals as it can cause an upset stomach. Do not take other NSAIDs while taking Ibuprofen such as (Aleve, Naprosyn, Aspirin, Celebrex, etc) and do not take more than the prescribed dose as this can lead to ulcers and bleeding in your GI tract. You may use warm and cold compresses to help with your symptoms.   You were also given a muscle relaxer called Robaxin. Be aware that it may make you sleepy and groggy and you should not work, drive, or operate machinery while taking this medication.  You may take this medication at night to reduce muscle spasms help you sleep better.    Please follow up with your orthopedic doctor for further evaluation of your symptoms.  Also follow-up with your primary doctor within the next 7-10 days for re-evaluation and further treatment of your symptoms.   Please return to the ER sooner if you have any new or worsening symptoms.

## 2017-03-27 NOTE — ED Notes (Signed)
Declined W/C at D/C and was escorted to lobby by RN. 

## 2017-05-31 ENCOUNTER — Encounter (HOSPITAL_COMMUNITY): Payer: Self-pay | Admitting: Emergency Medicine

## 2017-05-31 ENCOUNTER — Ambulatory Visit (HOSPITAL_COMMUNITY)
Admission: EM | Admit: 2017-05-31 | Discharge: 2017-05-31 | Disposition: A | Discharge status: Home / Self Care | Payer: BLUE CROSS/BLUE SHIELD | Attending: Family Medicine | Admitting: Family Medicine

## 2017-05-31 DIAGNOSIS — M5441 Lumbago with sciatica, right side: Secondary | ICD-10-CM | POA: Diagnosis not present

## 2017-05-31 MED ORDER — IBUPROFEN 800 MG PO TABS: 800.0000 mg | ORAL_TABLET | Freq: Three times a day (TID) | ORAL | 3 refills | Status: DC | PRN

## 2017-05-31 MED ORDER — METHOCARBAMOL 750 MG PO TABS: 750.0000 mg | ORAL_TABLET | Freq: Four times a day (QID) | ORAL | 0 refills | Status: DC | PRN

## 2017-05-31 MED ORDER — METHYLPREDNISOLONE 4 MG PO TBPK: ORAL_TABLET | ORAL | 0 refills | Status: DC

## 2017-05-31 NOTE — Discharge Instructions (Signed)
Rest Use ice or heat Activity as tolerated Avoid bending and lifting over 15 lbs follow up with  orthopedics Prednisone as directed AFTER the prednisone take ibuprofen 800 with food Robaxin as needed muscle relaxer

## 2017-05-31 NOTE — ED Triage Notes (Signed)
Pt c/o lower mid back pain x3 days.

## 2017-07-04 ENCOUNTER — Other Ambulatory Visit: Payer: Self-pay | Admitting: Family

## 2017-07-04 DIAGNOSIS — Z1231 Encounter for screening mammogram for malignant neoplasm of breast: Secondary | ICD-10-CM

## 2017-07-19 ENCOUNTER — Encounter (HOSPITAL_COMMUNITY): Payer: Self-pay | Admitting: Emergency Medicine

## 2017-07-19 ENCOUNTER — Ambulatory Visit (HOSPITAL_COMMUNITY)
Admission: EM | Admit: 2017-07-19 | Discharge: 2017-07-19 | Disposition: A | Discharge status: Home / Self Care | Payer: BLUE CROSS/BLUE SHIELD | Attending: Internal Medicine | Admitting: Internal Medicine

## 2017-07-19 ENCOUNTER — Other Ambulatory Visit: Payer: Self-pay

## 2017-07-19 DIAGNOSIS — I10 Essential (primary) hypertension: Secondary | ICD-10-CM | POA: Insufficient documentation

## 2017-07-19 DIAGNOSIS — L03116 Cellulitis of left lower limb: Secondary | ICD-10-CM | POA: Diagnosis not present

## 2017-07-19 DIAGNOSIS — K219 Gastro-esophageal reflux disease without esophagitis: Secondary | ICD-10-CM | POA: Insufficient documentation

## 2017-07-19 DIAGNOSIS — H409 Unspecified glaucoma: Secondary | ICD-10-CM | POA: Diagnosis not present

## 2017-07-19 DIAGNOSIS — M109 Gout, unspecified: Secondary | ICD-10-CM | POA: Diagnosis not present

## 2017-07-19 DIAGNOSIS — Z7984 Long term (current) use of oral hypoglycemic drugs: Secondary | ICD-10-CM | POA: Insufficient documentation

## 2017-07-19 DIAGNOSIS — M170 Bilateral primary osteoarthritis of knee: Secondary | ICD-10-CM | POA: Diagnosis not present

## 2017-07-19 DIAGNOSIS — S00521A Blister (nonthermal) of lip, initial encounter: Secondary | ICD-10-CM | POA: Diagnosis present

## 2017-07-19 DIAGNOSIS — Z79899 Other long term (current) drug therapy: Secondary | ICD-10-CM | POA: Insufficient documentation

## 2017-07-19 DIAGNOSIS — E1140 Type 2 diabetes mellitus with diabetic neuropathy, unspecified: Secondary | ICD-10-CM | POA: Insufficient documentation

## 2017-07-19 DIAGNOSIS — E876 Hypokalemia: Secondary | ICD-10-CM | POA: Diagnosis not present

## 2017-07-19 DIAGNOSIS — E785 Hyperlipidemia, unspecified: Secondary | ICD-10-CM | POA: Insufficient documentation

## 2017-07-19 DIAGNOSIS — Z880 Allergy status to penicillin: Secondary | ICD-10-CM | POA: Insufficient documentation

## 2017-07-19 DIAGNOSIS — Z7982 Long term (current) use of aspirin: Secondary | ICD-10-CM | POA: Insufficient documentation

## 2017-07-19 DIAGNOSIS — B001 Herpesviral vesicular dermatitis: Secondary | ICD-10-CM | POA: Diagnosis not present

## 2017-07-19 MED ORDER — VALACYCLOVIR HCL 1 G PO TABS: 1000.0000 mg | ORAL_TABLET | Freq: Two times a day (BID) | ORAL | 0 refills | Status: AC

## 2017-07-19 NOTE — ED Triage Notes (Signed)
Blistery rash to left, upper lip.  Noticed 2 days ago

## 2017-07-19 NOTE — ED Provider Notes (Signed)
[facility]    CSN: [id number] Arrival date & time: 07/19/17  1627     History   Chief Complaint Chief Complaint  Patient presents with  . Blister    HPI Alice Hickman is a 67 y.o. female.   Alice Hickman presents with complaints of lip blister/lesion which developed approximately 2 days ago. Started out simply as itchy skin and then erupted into vesicles. Denies pain. Denies any previous. Has had shingles, approximately 2 years ago. Has been under a lot of stress as her 69 year old granddaughter is undergoing breast cancer treatment. Has been using topical abreva which has not helped. No sore throat, cough, congestion or fevers. Hx htn, dm, oa, back pain, neuropathy.   ROS per HPI.      Past Medical History:  Diagnosis Date  . ALLERGIC RHINITIS 05/01/2007  . Allergy   . ANKLE PAIN, RIGHT 09/02/2009  . ANXIETY, SITUATIONAL 09/02/2009  . CARPAL TUNNEL SYNDROME, RIGHT 11/15/2006  . DM w/o Complication Type II 11/15/2006  . Generalized osteoarthritis 10/27/2012   bilat ankles, knees, hands and wrists  . GERD 11/15/2006  . GLAUCOMA 07/04/2008  . Gout 11/14/2014  . HYPERLIPIDEMIA 05/01/2007  . HYPERTENSION 11/15/2006  . HYPOKALEMIA 10/02/2008  . Low back pain   . OSTEOPENIA 09/02/2009  . Peripheral neuropathy 11/14/2014  . THUMB PAIN, RIGHT 04/02/2009    Patient Active Problem List   Diagnosis Date Noted  . Peripheral neuropathy 11/14/2014  . Gout 11/14/2014  . Right foot pain 07/18/2014  . Cellulitis of left foot 07/18/2014  . Hypokalemia 05/08/2014  . Pes planus of both feet 05/01/2013  . Osteoarthritis of both knees 12/01/2012  . Swelling of both ankles 10/31/2012  . Generalized osteoarthritis 10/27/2012  . Recurrent falls 09/11/2012  . ANXIETY, SITUATIONAL 09/02/2009  . OSTEOPENIA 09/02/2009  . GLAUCOMA 07/04/2008  . Hyperlipidemia 05/01/2007  . ALLERGIC RHINITIS 05/01/2007  . 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 CARPAL TUNNEL SYNDROME, RIGHT  11/15/2006  . Essential hypertension 11/15/2006  . GERD 11/15/2006    Past Surgical History:  Procedure Laterality Date  . right shoulder sugury    . TONSILLECTOMY      OB History   None      Home Medications    Prior to Admission medications   Medication Sig Start Date End Date Taking? Authorizing Provider  aspirin 81 MG tablet Take 81 mg by mouth daily.     Yes [provider]  glimepiride (AMARYL) 2 MG tablet TAKE ONE TABLET BY MOUTH ONCE DAILY BEFORE BREAKFAST 11/06/15  Yes Corwin Levins, MD  olmesartan-hydrochlorothiazide (BENICAR HCT) 40-25 MG tablet TAKE ONE TABLET BY MOUTH ONCE DAILY. 11/06/15  Yes Corwin Levins, MD  acetaminophen (TYLENOL) 325 MG tablet Take 2 tablets (650 mg total) by mouth every 6 (six) hours as needed. Do not take more than 4000mg  of tylenol per day 03/27/17   Couture, Cortni S, PA-C  fish oil-omega-3 fatty acids 1000 MG capsule Take 2 g by mouth daily.    [provider]  ibuprofen (ADVIL,MOTRIN) 800 MG tablet Take 1 tablet (800 mg total) by mouth every 8 (eight) hours as needed for moderate pain. 05/31/17   Eustace Moore, MD  loratadine (CLARITIN) 10 MG tablet Take 1 tablet (10 mg total) by mouth daily. 07/17/15   Ofilia Neas, PA-C  methocarbamol (ROBAXIN) 750 MG tablet Take 1 tablet (750 mg total) by mouth every 6 (six) hours as needed for muscle spasms. 05/31/17  Eustace Moore, MD  methylPREDNISolone (MEDROL DOSEPAK) 4 MG TBPK tablet tad 05/31/17   Eustace Moore, MD  valACYclovir (VALTREX) 1000 MG tablet Take 1 tablet (1,000 mg total) by mouth 2 (two) times daily for 10 days. 07/19/17 07/29/17  Georgetta Haber, NP    Family History Family History  Problem Relation Age of Onset  . Heart disease Mother   . Hypertension Mother   . Cancer Brother        throat cancer, smoker  . Heart disease Other   . Hypertension Other   . Cancer Other        lung cancer  . Cancer Brother     Social History Social History    Tobacco Use  . Smoking status: Never Smoker  . Smokeless tobacco: Never Used  Substance Use Topics  . Alcohol use: No  . Drug use: No     Allergies   Lovastatin; Metformin; and Penicillins   Review of Systems Review of Systems   Physical Exam Triage Vital Signs ED Triage Vitals  Enc Vitals Group     BP 07/19/17 1651 (!) 147/73     Pulse Rate 07/19/17 1651 70     Resp 07/19/17 1651 20     Temp 07/19/17 1651 98.5 F (36.9 C)     Temp Source 07/19/17 1651 Oral     SpO2 07/19/17 1651 100 %     Weight --      Height --      Head Circumference --      Peak Flow --      Pain Score 07/19/17 1649 0     Pain Loc --      Pain Edu? --      Excl. in GC? --    No data found.  Updated Vital Signs BP (!) 147/73 (BP Location: Right Arm) Comment (BP Location): large cuff  Pulse 70   Temp 98.5 F (36.9 C) (Oral)   Resp 20   SpO2 100%   Physical Exam  Constitutional: She is oriented to person, place, and time. She appears well-developed and well-nourished. No distress.  HENT:  Mouth/Throat:    Cluster of small vesicles to corner of left upper lip; no active drainage; non tender, no surrounding redness; see photo   Cardiovascular: Normal rate, regular rhythm and normal heart sounds.  Pulmonary/Chest: Effort normal and breath sounds normal.  Neurological: She is alert and oriented to person, place, and time.  Skin: Skin is warm and dry.       UC Treatments / Results  Labs (all labs ordered are listed, but only abnormal results are displayed) Labs Reviewed  HSV CULTURE AND TYPING    EKG None  Radiology No results found.  Procedures Procedures (including critical care time)  Medications Ordered in UC Medications - No data to display  Initial Impression / Assessment and Plan / UC Course  I have reviewed the triage vital signs and the nursing notes.  Pertinent labs & imaging results that were available during my care of the patient were reviewed by me  and considered in my medical decision making (see chart for details).     Appears consistent with a herpetic lesion. Valtrex initiated. hsv swab collected and pending. Continue with abreva. If symptoms worsen or do not improve in the next week to return to be seen or to follow up with PCP.  Patient verbalized understanding and agreeable to plan.   Final Clinical Impressions(s) / UC Diagnoses  Final diagnoses:  Cold sore     Discharge Instructions     I have swabbed your lip to try to test for herpes virus which is what this appears to be consistent with.  We will start treatment for this.  You may continue to use topical abreva.      ED Prescriptions    Medication Sig Dispense Auth. Provider   valACYclovir (VALTREX) 1000 MG tablet Take 1 tablet (1,000 mg total) by mouth 2 (two) times daily for 10 days. 20 tablet Georgetta HaberBurky, Wrenley Sayed B, NP     Controlled Substance Prescriptions [place] Controlled Substance Registry consulted? Not Applicable   Georgetta HaberBurky, Rosalva Neary B, NP 07/19/17 1721

## 2017-07-19 NOTE — Discharge Instructions (Signed)
I have swabbed your lip to try to test for herpes virus which is what this appears to be consistent with.  We will start treatment for this.  You may continue to use topical abreva.

## 2017-07-21 LAB — HSV CULTURE AND TYPING

## 2017-07-23 ENCOUNTER — Telehealth (HOSPITAL_COMMUNITY): Payer: Self-pay

## 2017-07-23 NOTE — Telephone Encounter (Signed)
Attempted to reach patient x 1 regarding positive HSV results. No answer at this time. Will try again.

## 2017-07-25 ENCOUNTER — Telehealth (HOSPITAL_COMMUNITY): Payer: Self-pay

## 2017-07-25 NOTE — Telephone Encounter (Signed)
Attempted to reach patient x 2 

## 2017-11-30 ENCOUNTER — Ambulatory Visit: Payer: BLUE CROSS/BLUE SHIELD

## 2018-07-01 ENCOUNTER — Emergency Department (HOSPITAL_COMMUNITY): Payer: BC Managed Care – PPO

## 2018-07-01 ENCOUNTER — Other Ambulatory Visit: Payer: Self-pay

## 2018-07-01 ENCOUNTER — Encounter (HOSPITAL_COMMUNITY): Payer: Self-pay | Admitting: Emergency Medicine

## 2018-07-01 ENCOUNTER — Emergency Department (HOSPITAL_COMMUNITY)
Admission: EM | Admit: 2018-07-01 | Discharge: 2018-07-01 | Disposition: A | Discharge status: Home / Self Care | Payer: BC Managed Care – PPO | Attending: Emergency Medicine | Admitting: Emergency Medicine

## 2018-07-01 DIAGNOSIS — E1140 Type 2 diabetes mellitus with diabetic neuropathy, unspecified: Secondary | ICD-10-CM | POA: Diagnosis not present

## 2018-07-01 DIAGNOSIS — Z79899 Other long term (current) drug therapy: Secondary | ICD-10-CM | POA: Diagnosis not present

## 2018-07-01 DIAGNOSIS — Z20828 Contact with and (suspected) exposure to other viral communicable diseases: Secondary | ICD-10-CM | POA: Insufficient documentation

## 2018-07-01 DIAGNOSIS — R0602 Shortness of breath: Secondary | ICD-10-CM | POA: Diagnosis present

## 2018-07-01 DIAGNOSIS — M10072 Idiopathic gout, left ankle and foot: Secondary | ICD-10-CM | POA: Diagnosis not present

## 2018-07-01 DIAGNOSIS — I10 Essential (primary) hypertension: Secondary | ICD-10-CM | POA: Diagnosis not present

## 2018-07-01 DIAGNOSIS — Z7984 Long term (current) use of oral hypoglycemic drugs: Secondary | ICD-10-CM | POA: Insufficient documentation

## 2018-07-01 DIAGNOSIS — M109 Gout, unspecified: Secondary | ICD-10-CM

## 2018-07-01 LAB — COMPREHENSIVE METABOLIC PANEL
ALT: 18 U/L (ref 0–44)
AST: 17 U/L (ref 15–41)
Albumin: 3.5 g/dL (ref 3.5–5.0)
Alkaline Phosphatase: 48 U/L (ref 38–126)
Anion gap: 6 (ref 5–15)
BUN: 11 mg/dL (ref 8–23)
CO2: 26 mmol/L (ref 22–32)
Calcium: 9.1 mg/dL (ref 8.9–10.3)
Chloride: 107 mmol/L (ref 98–111)
Creatinine, Ser: 0.83 mg/dL (ref 0.44–1.00)
GFR calc Af Amer: >60 mL/min (ref >60)
GFR calc non Af Amer: >60 mL/min (ref >60)
Glucose, Bld: 130 mg/dL — ABNORMAL HIGH (ref 70–99)
Potassium: 3.5 mmol/L (ref 3.5–5.1)
Sodium: 139 mmol/L (ref 135–145)
Total Bilirubin: 0.3 mg/dL (ref 0.3–1.2)
Total Protein: 7.1 g/dL (ref 6.5–8.1)

## 2018-07-01 LAB — CBC WITH DIFFERENTIAL/PLATELET
Abs Immature Granulocytes: 0.03 10*3/uL (ref 0.00–0.07)
Basophils Absolute: 0 10*3/uL (ref 0.0–0.1)
Basophils Relative: 1 %
Eosinophils Absolute: 0.1 10*3/uL (ref 0.0–0.5)
Eosinophils Relative: 1 %
HCT: 38.5 % (ref 36.0–46.0)
Hemoglobin: 12.1 g/dL (ref 12.0–15.0)
Immature Granulocytes: 1 %
Lymphocytes Relative: 16 %
Lymphs Abs: 0.9 10*3/uL (ref 0.7–4.0)
MCH: 28.1 pg (ref 26.0–34.0)
MCHC: 31.4 g/dL (ref 30.0–36.0)
MCV: 89.3 fL (ref 80.0–100.0)
Monocytes Absolute: 0.3 10*3/uL (ref 0.1–1.0)
Monocytes Relative: 5 %
Neutro Abs: 4.4 10*3/uL (ref 1.7–7.7)
Neutrophils Relative %: 76 %
Platelets: 214 10*3/uL (ref 150–400)
RBC: 4.31 MIL/uL (ref 3.87–5.11)
RDW: 13.4 % (ref 11.5–15.5)
WBC: 5.7 10*3/uL (ref 4.0–10.5)
nRBC: 0 % (ref 0.0–0.2)

## 2018-07-01 LAB — SARS CORONAVIRUS 2 BY RT PCR (HOSPITAL ORDER, PERFORMED IN ~~LOC~~ HOSPITAL LAB): SARS Coronavirus 2: NEGATIVE

## 2018-07-01 LAB — BRAIN NATRIURETIC PEPTIDE: B Natriuretic Peptide: 149 pg/mL — ABNORMAL HIGH (ref 0.0–100.0)

## 2018-07-01 LAB — D-DIMER, QUANTITATIVE: D-Dimer, Quant: 0.36 ug/mL-FEU (ref 0.00–0.50)

## 2018-07-01 LAB — TROPONIN I
Troponin I: 0.03 ng/mL (ref <0.03)
Troponin I: 0.03 ng/mL (ref <0.03)

## 2018-07-01 LAB — LIPASE, BLOOD: Lipase: 20 U/L (ref 11–51)

## 2018-07-01 LAB — CBG MONITORING, ED: Glucose-Capillary: 125 mg/dL — ABNORMAL HIGH (ref 70–99)

## 2018-07-01 MED ORDER — SODIUM CHLORIDE 0.9 % IV BOLUS
1000.0000 mL | Freq: Once | INTRAVENOUS | Status: AC
  Administered 2018-07-01: 14:00:00 1000 mL via INTRAVENOUS

## 2018-07-01 MED ORDER — PREDNISONE 10 MG PO TABS: 40.0000 mg | ORAL_TABLET | Freq: Every day | ORAL | 0 refills | Status: DC

## 2018-07-01 NOTE — ED Notes (Signed)
Re-called cardiology- paged to [place] [phone]

## 2018-07-01 NOTE — ED Provider Notes (Signed)
[facility]-EMERGENCY DEPT Provider Note   CSN: [id number] Arrival date & time: 07/01/18  1221    History   Chief Complaint Chief Complaint  Patient presents with  . Shortness of Breath    HPI Alice Hickman is a 68 y.o. female with PMHx type II DM, HTN, gout, who presents to the ED complaining of shortness of breath x 2-3 days. Pt also reports feeling generally weak. She states that her blood sugars have been running "low" recently. When prompted how low she reports it was 58 this AM. Pt states she drank some juice but did not check her sugars afterwards. She currently takes Glimepiride for her diabetes. She also reports her left foot has been swollen and warm to the touch for the past couple of days; pt has a hx of gout and states this feels similar. She states she was taking medication in the past for it but that her daughter took it away from her due to thinking it could be causing her foot swelling. No recent prolonged travel or immobilization although pt has been staying at home more often than not due to covid 19 pandemic. No hx DVT or PE in the past. No hemoptysis. No estrogen therapy currently. Denies fever, chills, chest pain, abdominal pain, nausea, vomiting, cough, or any other associated symptoms. No recent exposure to covid 19 positive patient.        Past Medical History:  Diagnosis Date  . ALLERGIC RHINITIS 05/01/2007  . Allergy   . ANKLE PAIN, RIGHT 09/02/2009  . ANXIETY, SITUATIONAL 09/02/2009  . CARPAL TUNNEL SYNDROME, RIGHT 11/15/2006  . DM w/o Complication Type II 11/15/2006  . Generalized osteoarthritis 10/27/2012   bilat ankles, knees, hands and wrists  . GERD 11/15/2006  . GLAUCOMA 07/04/2008  . Gout 11/14/2014  . HYPERLIPIDEMIA 05/01/2007  . HYPERTENSION 11/15/2006  . HYPOKALEMIA 10/02/2008  . Low back pain   . OSTEOPENIA 09/02/2009  . Peripheral neuropathy 11/14/2014  . THUMB PAIN, RIGHT 04/02/2009    Patient Active Problem List   Diagnosis  Date Noted  . Peripheral neuropathy 11/14/2014  . Gout 11/14/2014  . Right foot pain 07/18/2014  . Cellulitis of left foot 07/18/2014  . Hypokalemia 05/08/2014  . Pes planus of both feet 05/01/2013  . Osteoarthritis of both knees 12/01/2012  . Swelling of both ankles 10/31/2012  . Generalized osteoarthritis 10/27/2012  . Recurrent falls 09/11/2012  . ANXIETY, SITUATIONAL 09/02/2009  . OSTEOPENIA 09/02/2009  . GLAUCOMA 07/04/2008  . Hyperlipidemia 05/01/2007  . ALLERGIC RHINITIS 05/01/2007  . 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 CARPAL TUNNEL SYNDROME, RIGHT 11/15/2006  . Essential hypertension 11/15/2006  . GERD 11/15/2006    Past Surgical History:  Procedure Laterality Date  . right shoulder sugury    . TONSILLECTOMY       OB History   No obstetric history on file.      Home Medications    Prior to Admission medications   Medication Sig Start Date End Date Taking? Authorizing Provider  fish oil-omega-3 fatty acids 1000 MG capsule Take 1 g by mouth 2 (two) times a day.    Yes [provider]  glimepiride (AMARYL) 2 MG tablet TAKE ONE TABLET BY MOUTH ONCE DAILY BEFORE BREAKFAST Patient taking differently: Take 2 mg by mouth daily with breakfast.  11/06/15  Yes Corwin Levins, MD  olmesartan-hydrochlorothiazide (BENICAR HCT) 40-25 MG tablet TAKE ONE TABLET BY MOUTH ONCE DAILY. Patient taking differently: Take 1 tablet by  mouth daily.  11/06/15  Yes Corwin Levins, MD  acetaminophen (TYLENOL) 325 MG tablet Take 2 tablets (650 mg total) by mouth every 6 (six) hours as needed. Do not take more than  of tylenol per day Patient not taking: Reported on 07/01/2018 03/27/17   Couture, Cortni S, PA-C  ibuprofen (ADVIL,MOTRIN) 800 MG tablet Take 1 tablet (800 mg total) by mouth every 8 (eight) hours as needed for moderate pain. Patient not taking: Reported on 07/01/2018 05/31/17   Eustace Moore, MD  loratadine (CLARITIN) 10 MG tablet Take 1 tablet (10  mg total) by mouth daily. Patient not taking: Reported on 07/01/2018 07/17/15   Ofilia Neas, PA-C  methocarbamol (ROBAXIN) 750 MG tablet Take 1 tablet (750 mg total) by mouth every 6 (six) hours as needed for muscle spasms. Patient not taking: Reported on 07/01/2018 05/31/17   Eustace Moore, MD  methylPREDNISolone (MEDROL DOSEPAK) 4 MG TBPK tablet tad Patient not taking: Reported on 07/01/2018 05/31/17   Eustace Moore, MD  predniSONE (DELTASONE) 10 MG tablet Take 4 tablets (40 mg total) by mouth daily for 5 days. 07/01/18 07/06/18  Tanda Rockers, PA-C    Family History Family History  Problem Relation Age of Onset  . Heart disease Mother   . Hypertension Mother   . Cancer Brother        throat cancer, smoker  . Heart disease Other   . Hypertension Other   . Cancer Other        lung cancer  . Cancer Brother     Social History Social History   Tobacco Use  . Smoking status: Never Smoker  . Smokeless tobacco: Never Used  Substance Use Topics  . Alcohol use: No  . Drug use: No     Allergies   Lovastatin; Metformin; and Penicillins   Review of Systems Review of Systems  Constitutional: Negative for chills and fever.  HENT: Negative for congestion.   Eyes: Negative for visual disturbance.  Respiratory: Positive for shortness of breath. Negative for cough.   Cardiovascular: Negative for chest pain.  Gastrointestinal: Negative for abdominal pain, constipation, diarrhea, nausea and vomiting.  Genitourinary: Negative for dysuria.  Musculoskeletal: Positive for arthralgias and joint swelling.  Allergic/Immunologic: Positive for immunocompromised state.  Neurological: Positive for weakness (Generalized) and light-headedness. Negative for dizziness and headaches.     Physical Exam Updated Vital Signs BP (!) 152/65   Pulse (!) 54   Temp 97.7 F (36.5 C) (Oral)   Resp 14   SpO2 99%   Physical Exam Vitals signs and nursing note reviewed.  HENT:     Head:  Normocephalic and atraumatic.  Eyes:     Conjunctiva/sclera: Conjunctivae normal.  Neck:     Musculoskeletal: Neck supple.  Cardiovascular:     Rate and Rhythm: Normal rate and regular rhythm.     Pulses: Normal pulses.  Pulmonary:     Effort: Pulmonary effort is normal. No tachypnea or accessory muscle usage.     Breath sounds: Normal breath sounds. No decreased breath sounds, wheezing, rhonchi or rales.  Abdominal:     Palpations: Abdomen is soft.     Tenderness: There is abdominal tenderness.     Comments: LUQ abdominal tenderness to palpation; no rebound or guarding  Musculoskeletal:     Comments: Erythema and edema noted to dorsum of left foot; increased warmth to touch; tenderness to palpation along dorsum as well as with ROM of left great toe; no edema to left  lower extremity above the ankle  Skin:    General: Skin is warm and dry.  Neurological:     Mental Status: She is alert.      ED Treatments / Results  Labs (all labs ordered are listed, but only abnormal results are displayed) Labs Reviewed  COMPREHENSIVE METABOLIC PANEL - Abnormal; Notable for the following components:      Result Value   Glucose, Bld 130 (*)    All other components within normal limits  TROPONIN I - Abnormal; Notable for the following components:   Troponin I 0.03 (*)    All other components within normal limits  CBG MONITORING, ED - Abnormal; Notable for the following components:   Glucose-Capillary 125 (*)    All other components within normal limits  SARS CORONAVIRUS 2 (HOSPITAL ORDER, PERFORMED IN [facility])  CBC WITH DIFFERENTIAL/PLATELET  LIPASE, BLOOD  D-DIMER, QUANTITATIVE (NOT [facility])  URINALYSIS, ROUTINE W REFLEX MICROSCOPIC  BRAIN NATRIURETIC PEPTIDE    EKG EKG Interpretation  Date/Time:  Saturday Jul 01 2018 12:59:47 EDT Ventricular Rate:  53 PR Interval:    QRS Duration: 91 QT Interval:  438 QTC Calculation: 412 R Axis:   32 Text Interpretation:   Sinus rhythm Low voltage, precordial leads Nonspecific T abnormalities, diffuse leads Baseline wander in lead(s) V4 Confirmed by Geoffery LyonseLo, Douglas ([id number]) on 07/01/2018 2:58:50 PM   Radiology Dg Chest 2 View  Result Date: 07/01/2018 CLINICAL DATA:  Shortness of breath for 3 days, worse this morning. EXAM: CHEST - 2 VIEW COMPARISON:  PA and lateral chest 07/17/2015. FINDINGS: There is cardiomegaly and vascular congestion without edema. No consolidative process, pneumothorax or effusion. Aortic atherosclerosis noted. No acute or focal bony abnormality. IMPRESSION: Cardiomegaly and pulmonary vascular congestion without edema. Atherosclerosis. Electronically Signed   By: Drusilla Kannerhomas  Dalessio M.D.   On: 07/01/2018 13:25    Procedures Procedures (including critical care time)  Medications Ordered in ED Medications  sodium chloride 0.9 % bolus 1,000 mL (1,000 mLs Intravenous New Bag/Given 07/01/18 1404)     Initial Impression / Assessment and Plan / ED Course  I have reviewed the triage vital signs and the nursing notes.  Pertinent labs & imaging results that were available during my care of the patient were reviewed by me and considered in my medical decision making (see chart for details).  Clinical Course as of Jun 30 1517  Sat Jul 01, 2018  1455 Elevated troponin; discussed with attending physician Dr. Judd Lienelo who believes this is normal. Do not have another trop to compare.   Troponin I(!!): 0.03 [MV]    Clinical Course User Index [MV] Tanda RockersVenter, Elohim Brune, PA-C   Pt is a 68 year old female who presents to the ED today for shortness of breath and generalized fatigue. Reports sugars have been running low at home. Also complaining of swelling and redness to left foot; hx of gout; this appears similar on presentation; no swelling to LLE. No risk factors for PE although cannot PERC out due to age. D-dimer ordered at this time. Pt also mildly tender to LUQ on exam; will obtain CBC, CMP, lipase, troponin, EKG,  chest x ray. Swabbed for covid 19 as well. 1L NS given for comfort at this time. Will reevaluate once labs and imaging return. Concern for hypoglycemia vs viral illness vs PE.   CBG 125. No leukocytosis to suggest infection. D dimer negative; do not feel pt needs follow up CTA at this time. Lipase negative as well. Trop  mildly elevated (0.03); see ED course above. CXR with pulmonary vascular congestion.   3:04 PM At shift change case signed out to Terance Hart, PA-C, who will dispo patient accordingly. Awaiting covid test and U/A at this time. BNP added on given CXR findings. Suspect discharge home with Rx prednisone for gout.          Final Clinical Impressions(s) / ED Diagnoses   Final diagnoses:  SOB (shortness of breath)  Acute gout of left foot, unspecified cause    ED Discharge Orders         Ordered    predniSONE (DELTASONE) 10 MG tablet  Daily     07/01/18 1505           Tanda Rockers, PA-C 07/01/18 1519    Geoffery Lyons, MD 07/02/18 [phone]

## 2018-07-01 NOTE — ED Notes (Addendum)
CRITICAL VALUE STICKER  CRITICAL VALUE: Trop 0.03  RECEIVER (on-site recipient of call):Celeste  DATE & TIME NOTIFIED: 1443/07/01/20  MESSENGER (representative from lab): Lafe Garin  MD NOTIFIED: Dr. Judd Lien  TIME OF NOTIFICATION:1445 RESPONSE: Awaiting response.

## 2018-07-01 NOTE — Discharge Instructions (Addendum)
Please make an appointment with cardiology Avoid very salty foods or adding salt to foods Elevate your legs at night to help with swelling Please return if you are worsening

## 2018-07-01 NOTE — ED Notes (Signed)
Pt upon discharge states that the nurse in triage had put her glasses in the cabinet in the room. Writer looked for glasses in room, registration and with security not founded.  Alesia Banda, RN  as well as Clinical research associate report not seeing glasses on pt during treatment. Pt states that they were taken from her prior to entering room.

## 2018-07-01 NOTE — ED Provider Notes (Signed)
68 year old female presents with exertional SOB and malaise for the past several days. No chest pain. Has HTN but no CAD or CHF. She does report some peripheral edema. Vitals are normal. Trop minimally elevated (.03). D-dimer is negative. CXR shows cardiomegaly and vascular congestion without edema. BNP and COVID are pending.  BNP is 149. Trop is .03. Discussed with Dr. Criss Alvine. Will ambulate pt to see if she is very symptomatic and talk to cards. At rest pt appears comfortable.   Discussed with Dr. Deforest Hoyles. He recommends outpatient f/u. Instruct pt to buy a scale and monitor for weight gain, limit salt intake, and take antihypertensives religiously. Will have her f/u with Cardiology for an echo. 2nd Trop is 0.03.   Discussed with patient. She was comfortable with plan. She is already taking something for gout prescribed by her PCP so prescription for prednisone given by previous provider was discontinued.   Bethel Born, PA-C 07/01/18 2013    Pricilla Loveless, MD 07/01/18 2300

## 2018-07-01 NOTE — ED Notes (Signed)
O2= 98 Pulse 102, while ambulating down the hall. Patient tolerated well no assistance was needed.

## 2018-07-01 NOTE — ED Triage Notes (Signed)
Pt c/o SOB that started this morning. Denies fevers, cough.

## 2018-07-05 ENCOUNTER — Ambulatory Visit: Payer: BLUE CROSS/BLUE SHIELD | Admitting: Cardiovascular Disease

## 2018-07-06 ENCOUNTER — Telehealth: Payer: Self-pay

## 2018-07-06 DIAGNOSIS — I517 Cardiomegaly: Secondary | ICD-10-CM | POA: Insufficient documentation

## 2018-07-06 DIAGNOSIS — R0602 Shortness of breath: Secondary | ICD-10-CM | POA: Insufficient documentation

## 2018-07-06 NOTE — Progress Notes (Deleted)
Virtual Visit via Video Note   This visit type was conducted due to national recommendations for restrictions regarding the COVID-19 Pandemic (e.g. social distancing) in an effort to limit this patient's exposure and mitigate transmission in our community.  Due to her co-morbid illnesses, this patient is at least at moderate risk for complications without adequate follow up.  This format is felt to be most appropriate for this patient at this time.  All issues noted in this document were discussed and addressed.  A limited physical exam was performed with this format.  Please refer to the patient's chart for her consent to telehealth for [facility].   Evaluation Performed:  Cardiology Consult  This visit type was conducted due to national recommendations for restrictions regarding the COVID-19 Pandemic (e.g. social distancing).  This format is felt to be most appropriate for this patient at this time.  All issues noted in this document were discussed and addressed.  No physical exam was performed (except for noted visual exam findings with Video Visits).  Please refer to the patient's chart (MyChart message for video visits and phone note for telephone visits) for the patient's consent to telehealth for [facility].  Date:  07/06/2018   ID:  Alice Hickman, DOB 1951-01-17, MRN [id number]  Patient Location:  Home  Provider location:   [place]  PCP:  Maryagnes Amos, FNP  Cardiologist:   Electrophysiologist:  None   Chief Complaint:  SOB  History of Present Illness:    Alice Hickman is a 68 y.o. female who presents via audio/video conferencing for a telehealth visit today in referral by Caryn Bee, FNP for evaluation of SOB.    This is a 68yo AAF with a history of HTN who presented to [facility] with compliants of SOB and malaise for several days recently.  She complained of some mild LE edeam.  Trop was 0.03 and DDimer neg.  Cxray was c/w vascular congestion and CM  but no edema.  BNP was 149.  She was instructed to buy a scale and monitor weight, limit salt and take her BP meds daily.    She is here today for followup and is doing well.  She denies any chest pain or pressure, SOB, DOE, PND, orthopnea, LE edema, dizziness, palpitations or syncope. She is compliant with her meds and is tolerating meds with no SE.    The patient does not have symptoms concerning for COVID-19 infection (fever, chills, cough, or new shortness of breath).    Prior CV studies:   The following studies were reviewed today:  Hospital notes, labs  Past Medical History:  Diagnosis Date  . ALLERGIC RHINITIS 05/01/2007  . Allergy   . ANKLE PAIN, RIGHT 09/02/2009  . ANXIETY, SITUATIONAL 09/02/2009  . CARPAL TUNNEL SYNDROME, RIGHT 11/15/2006  . DM w/o Complication Type II 11/15/2006  . Generalized osteoarthritis 10/27/2012   bilat ankles, knees, hands and wrists  . GERD 11/15/2006  . GLAUCOMA 07/04/2008  . Gout 11/14/2014  . HYPERLIPIDEMIA 05/01/2007  . HYPERTENSION 11/15/2006  . HYPOKALEMIA 10/02/2008  . Low back pain   . OSTEOPENIA 09/02/2009  . Peripheral neuropathy 11/14/2014  . THUMB PAIN, RIGHT 04/02/2009   Past Surgical History:  Procedure Laterality Date  . right shoulder sugury    . TONSILLECTOMY       No outpatient medications have been marked as taking for the 07/07/18 encounter (Appointment) with Quintella Reichert, MD.     Allergies:   Lovastatin; Metformin; and  Penicillins   Social History   Tobacco Use  . Smoking status: Never Smoker  . Smokeless tobacco: Never Used  Substance Use Topics  . Alcohol use: No  . Drug use: No     Family Hx: The patient's family history includes Cancer in her brother, brother and another family member; Heart disease in her mother and another family member; Hypertension in her mother and another family member.  ROS:   Please see the history of present illness.     All other systems reviewed and are negative.   Labs/Other  Tests and Data Reviewed:    Recent Labs: 07/01/2018: ALT 18; B Natriuretic Peptide 149.0; BUN 11; Creatinine, Ser 0.83; Hemoglobin 12.1; Platelets 214; Potassium 3.5; Sodium 139   Recent Lipid Panel Lab Results  Component Value Date/Time   CHOL 194 11/14/2014 10:30 AM   TRIG 102.0 11/14/2014 10:30 AM   HDL 47.80 11/14/2014 10:30 AM   CHOLHDL 4 11/14/2014 10:30 AM   LDLCALC 126 (H) 11/14/2014 10:30 AM   LDLDIRECT 136.0 09/02/2009 08:40 AM    Wt Readings from Last 3 Encounters:  03/27/17 225 lb (102.1 kg)  07/17/15 220 lb (99.8 kg)  11/14/14 227 lb (103 kg)     Objective:    Vital Signs:  There were no vitals taken for this visit.   CONSTITUTIONAL:  Well nourished, well developed female in no acute distress.  EYES: anicteric MOUTH: oral mucosa is pink RESPIRATORY: Normal respiratory effort, symmetric expansion CARDIOVASCULAR: No peripheral edema SKIN: No rash, lesions or ulcers MUSCULOSKELETAL: no digital cyanosis NEURO: Cranial Nerves II-XII grossly intact, moves all extremities PSYCH: Intact judgement and insight.  A&O x 3, Mood/affect appropriate   ASSESSMENT & PLAN:    1.  SOB  2.  Hypertension - his BP is controlled on exam today.  He will continue on Olmesartan HCT daily.   3.  Cardiomegaly - this was noted on chest xray.  I will get a 2D echo to assess LV size and function.   4.  COVID-19 Education:The signs and symptoms of COVID-19 were discussed with the patient and how to seek care for testing (follow up with PCP or arrange E-visit).  The importance of social distancing was discussed today.  Patient Risk:   After full review of this patient's clinical status, I feel that they are at least moderate risk at this time.  Time:   Today, I have spent *** minutes directly with the patient on video discussing medical problems including SOB.  We also reviewed the symptoms of COVID 19 and the ways to protect against contracting the virus with telehealth technology.  I  spent an additional 5 minutes reviewing patient's chart including labs, EKG.  Medication Adjustments/Labs and Tests Ordered: Current medicines are reviewed at length with the patient today.  Concerns regarding medicines are outlined above.  Tests Ordered: No orders of the defined types were placed in this encounter.  Medication Changes: No orders of the defined types were placed in this encounter.   Disposition:  Follow up prn  Signed, Armanda Magicraci Adrain Nesbit, MD  07/06/2018 9:57 PM     [facility]

## 2018-07-06 NOTE — Telephone Encounter (Signed)
Left message to call back to change upcoming appointment with Dr Mayford Knife into a virtual visit. Need to find out if patient would like video or telephone and get consent.

## 2018-07-07 ENCOUNTER — Other Ambulatory Visit: Payer: Self-pay

## 2018-07-07 ENCOUNTER — Telehealth: Payer: Medicare HMO | Admitting: Cardiology

## 2018-07-27 ENCOUNTER — Encounter: Payer: Self-pay | Admitting: Cardiology

## 2018-07-27 ENCOUNTER — Other Ambulatory Visit: Payer: Self-pay

## 2018-07-27 ENCOUNTER — Ambulatory Visit: Payer: Medicare HMO | Admitting: Cardiology

## 2018-07-27 VITALS — BP 129/68 | HR 97 | Temp 97.3°F | Ht 63.0 in | Wt 201.7 lb

## 2018-07-27 DIAGNOSIS — M542 Cervicalgia: Secondary | ICD-10-CM

## 2018-07-27 DIAGNOSIS — R9431 Abnormal electrocardiogram [ECG] [EKG]: Secondary | ICD-10-CM | POA: Insufficient documentation

## 2018-07-27 DIAGNOSIS — Z13220 Encounter for screening for lipoid disorders: Secondary | ICD-10-CM

## 2018-07-27 DIAGNOSIS — R0602 Shortness of breath: Secondary | ICD-10-CM | POA: Diagnosis not present

## 2018-07-27 MED ORDER — ROSUVASTATIN CALCIUM 5 MG PO TABS: 5.0000 mg | ORAL_TABLET | Freq: Every day | ORAL | 2 refills | Status: DC

## 2018-07-27 MED ORDER — ASPIRIN EC 81 MG PO TBEC: 81.0000 mg | DELAYED_RELEASE_TABLET | Freq: Every day | ORAL | 2 refills | Status: AC

## 2018-07-27 NOTE — Progress Notes (Signed)
Patient referred by [name],* for shortness of breath  Subjective:   Alice Hickman, female    DOB: 1950/05/12, 68 y.o.   MRN: [id number]   Chief Complaint  Patient presents with  . New Patient (Initial Visit)  . Shortness of Breath    HPI  68 y.o. African American female with hypertension, hyperlipidemia, type 2 DM, with shortness of breath.  Patient was in [facility] emergency department on 07/01/2018 with complaints of exertional dyspnea and pain across her shoulders. Work-up showed EKG with nonspecific T wave inversions.  Troponin was mildly elevated and flat at 0.03.  D-dimer was negative.  Chest x-ray showed cardiomegaly without vascular congestion.  At baseline, patient walks 30 minutes on treadmill at low speed, without any chest pain or shortness of breath.  She has not walked since her episode on 07/01/2018.  Her walking is limited due to arthritis in both knees.  She walks with a cane when going outside.  She is a retired Librarian, academic.  She currently lives with her daughter and granddaughter.  She is lifetime non-smoker.  Blood pressure and diabetes are well controlled.  Past Medical History:  Diagnosis Date  . ALLERGIC RHINITIS 05/01/2007  . Allergy   . ANKLE PAIN, RIGHT 09/02/2009  . ANXIETY, SITUATIONAL 09/02/2009  . CARPAL TUNNEL SYNDROME, RIGHT 11/15/2006  . DM w/o Complication Type II 41/96/2229  . Generalized osteoarthritis 10/27/2012   bilat ankles, knees, hands and wrists  . GERD 11/15/2006  . GLAUCOMA 07/04/2008  . Gout 11/14/2014  . HYPERLIPIDEMIA 05/01/2007  . HYPERTENSION 11/15/2006  . HYPOKALEMIA 10/02/2008  . Low back pain   . OSTEOPENIA 09/02/2009  . Peripheral neuropathy 11/14/2014  . THUMB PAIN, RIGHT 04/02/2009     Past Surgical History:  Procedure Laterality Date  . right shoulder sugury    . TONSILLECTOMY       Social History   Socioeconomic History  . Marital status: Single    Spouse name: Not on file  . Number of  children: 3  . Years of education: Not on file  . Highest education level: Not on file  Occupational History  . Occupation: FLOATER    Employer: [employer]  . Occupation: Psychologist, prison and probation services, as well as Administrator  . Financial resource strain: Not on file  . Food insecurity    Worry: Not on file    Inability: Not on file  . Transportation needs    Medical: Not on file    Non-medical: Not on file  Tobacco Use  . Smoking status: Never Smoker  . Smokeless tobacco: Never Used  Substance and Sexual Activity  . Alcohol use: No  . Drug use: No  . Sexual activity: Not on file  Lifestyle  . Physical activity    Days per week: Not on file    Minutes per session: Not on file  . Stress: Not on file  Relationships  . Social Herbalist on phone: Not on file    Gets together: Not on file    Attends religious service: Not on file    Active member of club or organization: Not on file    Attends meetings of clubs or organizations: Not on file    Relationship status: Not on file  . Intimate partner violence    Fear of current or ex partner: Not on file    Emotionally abused: Not on file    Physically abused: Not on  file    Forced sexual activity: Not on file  Other Topics Concern  . Not on file  Social History Narrative  . Not on file     Family History  Problem Relation Age of Onset  . Heart disease Mother   . Hypertension Mother   . Cancer Brother        throat cancer, smoker  . Heart disease Other   . Hypertension Other   . Cancer Other        lung cancer  . Cancer Brother      Current Outpatient Medications on File Prior to Visit  Medication Sig Dispense Refill  . fish oil-omega-3 fatty acids 1000 MG capsule Take 1 g by mouth 2 (two) times a day.     Marland Kitchen. glimepiride (AMARYL) 2 MG tablet TAKE ONE TABLET BY MOUTH ONCE DAILY BEFORE BREAKFAST (Patient taking differently: Take 2 mg by mouth daily with breakfast. ) 90 tablet 0  .  olmesartan-hydrochlorothiazide (BENICAR HCT) 40-25 MG tablet TAKE ONE TABLET BY MOUTH ONCE DAILY. (Patient taking differently: Take 1 tablet by mouth daily. ) 30 tablet 5   No current facility-administered medications on file prior to visit.     Cardiovascular studies:  EKG 07/27/2018: Sinus rhythm 92 bpm.  Poor R wave progression. T wave inversions seen on previous EKG on 07/01/2018 now resolved.   EKG 07/01/2018: Sinus rhythm 53 bpm. Diffuse T wave inversions. Consider ischemia.    Recent labs: Results for Alice Hickman, Alice Hickman (MRN [id number]) as of 07/27/2018 09:06  Ref. Range 07/01/2018 14:00 07/01/2018 18:19  COMPREHENSIVE METABOLIC PANEL Unknown Rpt (A)   Sodium Latest Ref Range: 135 - 145 mmol/L 139   Potassium Latest Ref Range: 3.5 - 5.1 mmol/L 3.5   Chloride Latest Ref Range: 98 - 111 mmol/L 107   CO2 Latest Ref Range: 22 - 32 mmol/L 26   Glucose Latest Ref Range: 70 - 99 mg/dL 147130 (H)   BUN Latest Ref Range: 8 - 23 mg/dL 11   Creatinine Latest Ref Range: 0.44 - 1.00 mg/dL 8.290.83   Calcium Latest Ref Range: 8.9 - 10.3 mg/dL 9.1   Anion gap Latest Ref Range: 5 - 15  6   Alkaline Phosphatase Latest Ref Range: 38 - 126 U/L 48   Albumin Latest Ref Range: 3.5 - 5.0 g/dL 3.5   Lipase Latest Ref Range: 11 - 51 U/L 20   AST Latest Ref Range: 15 - 41 U/L 17   ALT Latest Ref Range: 0 - 44 U/L 18   Total Protein Latest Ref Range: 6.5 - 8.1 g/dL 7.1   Total Bilirubin Latest Ref Range: 0.3 - 1.2 mg/dL 0.3   GFR, Est Non African American Latest Ref Range: >60 mL/min >60   GFR, Est African American Latest Ref Range: >60 mL/min >60   B Natriuretic Peptide Latest Ref Range: 0.0 - 100.0 pg/mL 149.0 (H)   Troponin I Latest Ref Range: <0.03 ng/mL 0.03 (HH) 0.03 (HH)   Results for Alice Hickman, Alice Hickman (MRN [id number]) as of 07/27/2018 09:06  Ref. Range 07/01/2018 14:00  WBC Latest Ref Range: 4.0 - 10.5 K/uL 5.7  RBC Latest Ref Range: 3.87 - 5.11 MIL/uL 4.31  Hemoglobin Latest Ref Range: 12.0 - 15.0  g/dL 78.412.1  HCT Latest Ref Range: 36.0 - 46.0 % 38.5  MCV Latest Ref Range: 80.0 - 100.0 fL 89.3  MCH Latest Ref Range: 26.0 - 34.0 pg 28.1  MCHC Latest Ref Range: 30.0 - 36.0 g/dL 31.4  RDW Latest Ref Range: 11.5 - 15.5 % 13.4  Platelets Latest Ref Range: 150 - 400 K/uL 214  nRBC Latest Ref Range: 0.0 - 0.2 % 0.0    Review of Systems  Constitution: Negative for decreased appetite, malaise/fatigue, weight gain and weight loss.  HENT: Negative for congestion.        Pain neck while swallowing  Eyes: Negative for visual disturbance.  Cardiovascular: Negative for chest pain, leg swelling, palpitations and syncope.  Respiratory: Positive for shortness of breath. Negative for cough.   Endocrine: Negative for cold intolerance.  Hematologic/Lymphatic: Does not bruise/bleed easily.  Skin: Negative for itching and rash.  Musculoskeletal: Positive for arthritis and joint pain. Negative for myalgias.  Gastrointestinal: Negative for abdominal pain, nausea and vomiting.  Genitourinary: Negative for dysuria.  Neurological: Negative for dizziness and weakness.  Psychiatric/Behavioral: The patient is not nervous/anxious.   All other systems reviewed and are negative.        Vitals:   07/27/18 0935  BP: 129/68  Pulse: 97  Temp: (!) 97.3 F (36.3 C)  SpO2: 97%     Body mass index is 35.73 kg/m. Filed Weights   07/27/18 0935  Weight: 91.5 kg     Objective:   Physical Exam  Constitutional: She is oriented to person, place, and time. She appears well-developed and well-nourished. No distress.  HENT:  Head: Normocephalic and atraumatic.  Eyes: Pupils are equal, round, and reactive to light. Conjunctivae are normal.  Neck: No JVD present.  No over thyromegaly  Cardiovascular: Normal rate, regular rhythm and intact distal pulses.  Pulmonary/Chest: Effort normal and breath sounds normal. She has no wheezes. She has no rales.  Abdominal: Soft. Bowel sounds are normal. There is no  rebound.  Musculoskeletal:        General: No edema.  Lymphadenopathy:    She has no cervical adenopathy.  Neurological: She is alert and oriented to person, place, and time. No cranial nerve deficit.  Skin: Skin is warm and dry.  Psychiatric: She has a normal mood and affect.  Nursing note and vitals reviewed.         Assessment & Recommendations:   68 y.o. African American female with hypertension, hyperlipidemia, type 2 DM, with shortness of breath.  Shortness of breath: Sudden onset episode on 07/01/2018, associated with mildly elevated flat troponin at 0.03, and T wave inversions on EKG are concerning for myocardial ischemia.  She has not had any recurrence of the symptoms.  Given her risk factors, I will proceed with echocardiogram and nuclear stress test.  In the meantime, recommend aspirin 81 mg daily and low-dose Crestor 5 mg daily.  Continue current antihypertensive therapy.  I will see her virtually after the tests.  Neck pain: Occurs when swallowing.  Recommend follow-up with PCP.  Elder NegusManish Hickman Jarron Curley, MD [facility] Pager: [phone] Office: [phone] If no answer Cell [phone]

## 2018-08-16 ENCOUNTER — Other Ambulatory Visit: Payer: Self-pay

## 2018-08-16 ENCOUNTER — Ambulatory Visit (INDEPENDENT_AMBULATORY_CARE_PROVIDER_SITE_OTHER): Payer: Medicare HMO

## 2018-08-16 DIAGNOSIS — R9431 Abnormal electrocardiogram [ECG] [EKG]: Secondary | ICD-10-CM

## 2018-08-16 DIAGNOSIS — R0602 Shortness of breath: Secondary | ICD-10-CM

## 2018-08-29 ENCOUNTER — Telehealth: Payer: Self-pay

## 2018-08-29 LAB — LIPID PANEL
Chol/HDL Ratio: 3.7 ratio (ref 0.0–4.4)
Cholesterol, Total: 187 mg/dL (ref 100–199)
HDL: 51 mg/dL (ref >39)
LDL Calculated: 120 mg/dL — ABNORMAL HIGH (ref 0–99)
Triglycerides: 78 mg/dL (ref 0–149)
VLDL Cholesterol Cal: 16 mg/dL (ref 5–40)

## 2018-08-29 NOTE — Progress Notes (Signed)
Reviewed lipid panel and stress test. Please let her know that I will discuss the abnormalities during the visit. Please ask her if she would like to have a virtual visit today.  Thanks MJP

## 2018-08-31 ENCOUNTER — Other Ambulatory Visit: Payer: Self-pay | Admitting: Family

## 2018-08-31 DIAGNOSIS — R5381 Other malaise: Secondary | ICD-10-CM

## 2018-09-01 ENCOUNTER — Encounter: Payer: Self-pay | Admitting: Gastroenterology

## 2018-09-04 ENCOUNTER — Ambulatory Visit (INDEPENDENT_AMBULATORY_CARE_PROVIDER_SITE_OTHER): Payer: Medicare HMO

## 2018-09-04 ENCOUNTER — Other Ambulatory Visit: Payer: Self-pay

## 2018-09-04 DIAGNOSIS — R0602 Shortness of breath: Secondary | ICD-10-CM | POA: Diagnosis not present

## 2018-09-04 DIAGNOSIS — R9431 Abnormal electrocardiogram [ECG] [EKG]: Secondary | ICD-10-CM

## 2018-09-06 ENCOUNTER — Telehealth: Payer: Self-pay | Admitting: *Deleted

## 2018-09-06 NOTE — Telephone Encounter (Signed)
Patient is for colonoscopy on  10/02/2018 with Dr.Danis. patient is for f/u cardiac OV on 09/13/2018, patient needs cardiac clearance before colonoscopy due to recent SOB. Left message for the patent to call me back so I can explain this to her.

## 2018-09-11 ENCOUNTER — Ambulatory Visit: Payer: Medicare HMO | Admitting: Cardiology

## 2018-09-12 ENCOUNTER — Other Ambulatory Visit: Payer: Self-pay | Admitting: Family

## 2018-09-12 DIAGNOSIS — M810 Age-related osteoporosis without current pathological fracture: Secondary | ICD-10-CM

## 2018-09-12 NOTE — Telephone Encounter (Signed)
Called pt- no answer- LM that she needs to get cardiac clearance at the cardiology OV tomorrow 09-13-2018 for her scheduled colon and have them fax it to [place] at [phone] or she can get a copy to bring to [facility] 8-17- instructed on message if we do not get this clearance, her colon may be canceled - left our number if she has questions, Lelan Pons

## 2018-09-13 ENCOUNTER — Telehealth (INDEPENDENT_AMBULATORY_CARE_PROVIDER_SITE_OTHER): Payer: Medicare HMO | Admitting: Cardiology

## 2018-09-13 ENCOUNTER — Other Ambulatory Visit: Payer: Self-pay

## 2018-09-13 DIAGNOSIS — R9439 Abnormal result of other cardiovascular function study: Secondary | ICD-10-CM

## 2018-09-13 DIAGNOSIS — R0602 Shortness of breath: Secondary | ICD-10-CM

## 2018-09-13 NOTE — Telephone Encounter (Signed)
Dr.Patwardhan,   Alice Hickman is for a colonoscopy with MAC sedation with Dr.Henry Danis on 10/02/2018.  We need cardiac clearance. Please advise. Thank you, Sundra Aland, RN.

## 2018-09-13 NOTE — Telephone Encounter (Signed)
She does have certain stress test abnormality, but probably okay to proceed. I had a telephone visit today with her to discuss the results, but we had to reschedule due to network issues. I will have my staff arrange a visit before August 31, before giving my final recommendation.  Staff, can you please arrange a visit with me before August 31?  Thanks MJP

## 2018-09-13 NOTE — Progress Notes (Signed)
     Patient referred by [name],* for shortness of breath  Subjective:   Alice Hickman, female    DOB: 07-02-1950, 68 y.o.   MRN: [id number]   I connected with the patient on 09/13/2018 by a telephone call and verified that I am speaking with the correct person using two identifiers.     I offered the patient a video enabled application for a virtual visit. Unfortunately, this could not be accomplished due to technical difficulties/lack of video enabled phone/computer. I discussed the limitations of evaluation and management by telemedicine and the availability of in person appointments. The patient expressed understanding and agreed to proceed.   This visit type was conducted due to national recommendations for restrictions regarding the COVID-19 Pandemic (e.g. social distancing).  This format is felt to be most appropriate for this patient at this time.  All issues noted in this document were discussed and addressed.  No physical exam was performed (except for noted visual exam findings with Tele health visits).  The patient has consented to conduct a Tele health visit and understands insurance will be billed.   HPI  68 y.o. African American female with hypertension, hyperlipidemia, type 2 DM, with atypical chest pain and shortness of breath.  Stress test showed  large area of moderate decrease in tracer uptake in mid to basal inferior, inferoseptal and inferolateral myocardium, more prominent on stress than rest images. While breast tissue attenuation could be responsible, ischemia in this area cannot be excluded.  Intermediate risk study.   Echocardiogram 09/04/2018: Normal LV systolic function with EF 55%. Left ventricle cavity is normal in size. Mild concentric hypertrophy of the left ventricle. Normal global wall motion. Doppler evidence of grade I (impaired) diastolic dysfunction, normal LAP. Calculated EF 55%. Left atrial cavity is mildly dilated. Right atrial  cavity is mildly dilated. Moderate (Grade II) mitral regurgitation. Mild tricuspid regurgitation.  No evidence of pulmonary hypertension.  Lexiscan Myoview stress test 08/16/2018:  Lexiscan stress test was performed. Stress EKG is non-diagnostic, as this is pharmacological stress test. SPECT images reveal large area of moderate decrease in tracer uptake in mid to basal inferior, inferoseptal and inferolateral myocardium, more prominent on stress than rest images. While breast tissue attenuation could be responsible, ischemia in this area cannot be excluded.  Intermediate risk study.   I attempted to discuss these results over the phone with the patient. However, patient was unable to follow all the discussion due to poor telephone signals. She also wanted her daughter to be a part of this discussion. I offered the patient an in office visit to discuss these results and further recommendations.   Will arrange an in office visit.  Time spent: 12 min  Ursa, MD [facility] Pager: [phone] Office: [phone] If no answer Cell [phone]

## 2018-09-14 ENCOUNTER — Telehealth: Payer: Self-pay

## 2018-09-14 DIAGNOSIS — R9439 Abnormal result of other cardiovascular function study: Secondary | ICD-10-CM | POA: Insufficient documentation

## 2018-09-15 ENCOUNTER — Encounter: Payer: Self-pay | Admitting: Cardiology

## 2018-09-15 ENCOUNTER — Ambulatory Visit (INDEPENDENT_AMBULATORY_CARE_PROVIDER_SITE_OTHER): Payer: Medicare HMO | Admitting: Cardiology

## 2018-09-15 ENCOUNTER — Other Ambulatory Visit: Payer: Self-pay

## 2018-09-15 VITALS — BP 133/72 | HR 76 | Temp 98.4°F | Ht 63.0 in | Wt 200.8 lb

## 2018-09-15 DIAGNOSIS — R0602 Shortness of breath: Secondary | ICD-10-CM | POA: Diagnosis not present

## 2018-09-15 DIAGNOSIS — R9439 Abnormal result of other cardiovascular function study: Secondary | ICD-10-CM

## 2018-09-15 MED ORDER — NITROGLYCERIN 0.4 MG SL SUBL: 0.4000 mg | SUBLINGUAL_TABLET | SUBLINGUAL | 3 refills | Status: DC | PRN

## 2018-09-15 NOTE — Telephone Encounter (Signed)
See Cardiac Office visit on 09/15/2018. Ok to proceed with Colonoscopy.

## 2018-09-15 NOTE — Progress Notes (Signed)
Patient referred by Alice Hickman,* for shortness of breath  Subjective:   Alice Hickman, female    DOB: 1950-07-22, 68 y.o.   MRN: [id number]   Chief Complaint  Patient presents with  . sx clearance    colonoscopy    HPI  68 y.o. African American female with hypertension, hyperlipidemia, type 2 DM, with shortness of breath.  Patient was in [facility] emergency department on 07/01/2018 with complaints of exertional dyspnea and pain across her shoulders. Work-up showed EKG with nonspecific T wave inversions.  Troponin was mildly elevated and flat at 0.03.  D-dimer was negative.  Chest x-ray showed cardiomegaly without vascular congestion. At baseline, patient walks 30 minutes on treadmill at low speed, without any chest pain or shortness of breath. She is back to working at [employer] and has not had any chest pain or shortness of breath symptoms.  Her stress test showed abnormalities, as detailed below.     Past Medical History:  Diagnosis Date  . ALLERGIC RHINITIS 05/01/2007  . Allergy   . ANKLE PAIN, RIGHT 09/02/2009  . ANXIETY, SITUATIONAL 09/02/2009  . CARPAL TUNNEL SYNDROME, RIGHT 11/15/2006  . DM w/o Complication Type II 31/51/7616  . Generalized osteoarthritis 10/27/2012   bilat ankles, knees, hands and wrists  . GERD 11/15/2006  . GLAUCOMA 07/04/2008  . Gout 11/14/2014  . HYPERLIPIDEMIA 05/01/2007  . HYPERTENSION 11/15/2006  . HYPOKALEMIA 10/02/2008  . Low back pain   . OSTEOPENIA 09/02/2009  . Peripheral neuropathy 11/14/2014  . THUMB PAIN, RIGHT 04/02/2009     Past Surgical History:  Procedure Laterality Date  . right shoulder sugury    . TONSILLECTOMY       Social History   Socioeconomic History  . Marital status: Single    Spouse name: Not on file  . Number of children: 3  . Years of education: Not on file  . Highest education level: Not on file  Occupational History  . Occupation: FLOATER    Employer: [employer]  . Occupation: Armed forces technical officer, as well as Administrator  . Financial resource strain: Not on file  . Food insecurity    Worry: Not on file    Inability: Not on file  . Transportation needs    Medical: Not on file    Non-medical: Not on file  Tobacco Use  . Smoking status: Never Smoker  . Smokeless tobacco: Never Used  Substance and Sexual Activity  . Alcohol use: No  . Drug use: No  . Sexual activity: Not on file  Lifestyle  . Physical activity    Days per week: Not on file    Minutes per session: Not on file  . Stress: Not on file  Relationships  . Social Herbalist on phone: Not on file    Gets together: Not on file    Attends religious service: Not on file    Active member of club or organization: Not on file    Attends meetings of clubs or organizations: Not on file    Relationship status: Not on file  . Intimate partner violence    Fear of current or ex partner: Not on file    Emotionally abused: Not on file    Physically abused: Not on file    Forced sexual activity: Not on file  Other Topics Concern  . Not on file  Social History Narrative  . Not on file  Family History  Problem Relation Age of Onset  . Heart disease Mother   . Hypertension Mother   . Cancer Brother        throat cancer, smoker  . Heart disease Other   . Hypertension Other   . Cancer Other        lung cancer  . Cancer Brother      Current Outpatient Medications on File Prior to Visit  Medication Sig Dispense Refill  . aspirin EC 81 MG tablet Take 1 tablet (81 mg total) by mouth daily. 30 tablet 2  . fish oil-omega-3 fatty acids 1000 MG capsule Take 1 g by mouth 2 (two) times a day.     . furosemide (LASIX) 20 MG tablet Take 20 mg by mouth daily.    Marland Kitchen. glimepiride (AMARYL) 2 MG tablet TAKE ONE TABLET BY MOUTH ONCE DAILY BEFORE BREAKFAST (Patient taking differently: Take 2 mg by mouth daily with breakfast. ) 90 tablet 0  . olmesartan-hydrochlorothiazide (BENICAR HCT) 40-25 MG tablet TAKE  ONE TABLET BY MOUTH ONCE DAILY. (Patient taking differently: Take 1 tablet by mouth daily. ) 30 tablet 5  . rosuvastatin (CRESTOR) 5 MG tablet Take 1 tablet (5 mg total) by mouth at bedtime. 30 tablet 2  . traZODone (DESYREL) 100 MG tablet as needed.     No current facility-administered medications on file prior to visit.     Cardiovascular studies:  Echocardiogram 09/04/2018: Normal LV systolic function with EF 55%. Left ventricle cavity is normal in size. Mild concentric hypertrophy of the left ventricle. Normal global wall motion. Doppler evidence of grade I (impaired) diastolic dysfunction, normal LAP. Calculated EF 55%. Left atrial cavity is mildly dilated. Right atrial cavity is mildly dilated. Moderate (Grade II) mitral regurgitation. Mild tricuspid regurgitation.  No evidence of pulmonary hypertension.  Lexiscan Myoview stress test 08/16/2018:  Lexiscan stress test was performed. Stress EKG is non-diagnostic, as this is pharmacological stress test. SPECT images reveal large area of moderate decrease in tracer uptake in mid to basal inferior, inferoseptal and inferolateral myocardium, more prominent on stress than rest images. While breast tissue attenuation could be responsible, ischemia in this area cannot be excluded.  Intermediate risk study.   EKG 07/27/2018: Sinus rhythm 92 bpm.  Poor R wave progression. T wave inversions seen on previous EKG on 07/01/2018 now resolved.   EKG 07/01/2018: Sinus rhythm 53 bpm. Diffuse T wave inversions. Consider ischemia.    Recent labs: Results for Alice Hickman, Alice Hickman (MRN [id number]) as of 07/27/2018 09:06  Ref. Range 07/01/2018 14:00 07/01/2018 18:19  COMPREHENSIVE METABOLIC PANEL Unknown Rpt (A)   Sodium Latest Ref Range: 135 - 145 mmol/L 139   Potassium Latest Ref Range: 3.5 - 5.1 mmol/L 3.5   Chloride Latest Ref Range: 98 - 111 mmol/L 107   CO2 Latest Ref Range: 22 - 32 mmol/L 26   Glucose Latest Ref Range: 70 - 99 mg/dL 725130 (H)    BUN Latest Ref Range: 8 - 23 mg/dL 11   Creatinine Latest Ref Range: 0.44 - 1.00 mg/dL 3.660.83   Calcium Latest Ref Range: 8.9 - 10.3 mg/dL 9.1   Anion gap Latest Ref Range: 5 - 15  6   Alkaline Phosphatase Latest Ref Range: 38 - 126 U/L 48   Albumin Latest Ref Range: 3.5 - 5.0 g/dL 3.5   Lipase Latest Ref Range: 11 - 51 U/L 20   AST Latest Ref Range: 15 - 41 U/L 17   ALT Latest Ref Range: 0 -  44 U/L 18   Total Protein Latest Ref Range: 6.5 - 8.1 g/dL 7.1   Total Bilirubin Latest Ref Range: 0.3 - 1.2 mg/dL 0.3   GFR, Est Non African American Latest Ref Range: >60 mL/min >60   GFR, Est African American Latest Ref Range: >60 mL/min >60   B Natriuretic Peptide Latest Ref Range: 0.0 - 100.0 pg/mL 149.0 (H)   Troponin I Latest Ref Range: <0.03 ng/mL 0.03 (HH) 0.03 [facility](HH)   Results for Alice Hickman, Debroh Hickman (MRN [id number]) as of 07/27/2018 09:06  Ref. Range 07/01/2018 14:00  WBC Latest Ref Range: 4.0 - 10.5 K/uL 5.7  RBC Latest Ref Range: 3.87 - 5.11 MIL/uL 4.31  Hemoglobin Latest Ref Range: 12.0 - 15.0 g/dL 40.912.1  HCT Latest Ref Range: 36.0 - 46.0 % 38.5  MCV Latest Ref Range: 80.0 - 100.0 fL 89.3  MCH Latest Ref Range: 26.0 - 34.0 pg 28.1  MCHC Latest Ref Range: 30.0 - 36.0 g/dL 81.131.4  RDW Latest Ref Range: 11.5 - 15.5 % 13.4  Platelets Latest Ref Range: 150 - 400 K/uL 214  nRBC Latest Ref Range: 0.0 - 0.2 % 0.0    Review of Systems  Constitution: Negative for decreased appetite, malaise/fatigue, weight gain and weight loss.  HENT: Negative for congestion.        Pain neck while swallowing  Eyes: Negative for visual disturbance.  Cardiovascular: Negative for chest pain, leg swelling, palpitations and syncope.  Respiratory: Positive for shortness of breath. Negative for cough.   Endocrine: Negative for cold intolerance.  Hematologic/Lymphatic: Does not bruise/bleed easily.  Skin: Negative for itching and rash.  Musculoskeletal: Positive for arthritis and joint pain, gout flare. Negative for  myalgias.  Gastrointestinal: Negative for abdominal pain, nausea and vomiting.  Genitourinary: Negative for dysuria.  Neurological: Negative for dizziness and weakness.  Psychiatric/Behavioral: The patient is not nervous/anxious.   All other systems reviewed and are negative.        Vitals:   09/15/18 1604  BP: 133/72  Pulse: 76  Temp: 98.4 F (36.9 C)  SpO2: 98%     Body mass index is 35.57 kg/m. Filed Weights   09/15/18 1604  Weight: 200 lb 12.8 oz (91.1 kg)     Objective:   Physical Exam  Constitutional: She is oriented to person, place, and time. She appears well-developed and well-nourished. No distress.  HENT:  Head: Normocephalic and atraumatic.  Eyes: Pupils are equal, round, and reactive to light. Conjunctivae are normal.  Neck: No JVD present.  No over thyromegaly  Cardiovascular: Normal rate, regular rhythm and intact distal pulses.  Pulmonary/Chest: Effort normal and breath sounds normal. She has no wheezes. She has no rales.  Abdominal: Soft. Bowel sounds are normal. There is no rebound.  Musculoskeletal:        General: No edema. Left toe swelling, appears gout exacerbation. Lymphadenopathy:    She has no cervical adenopathy.  Neurological: She is alert and oriented to person, place, and time. No cranial nerve deficit.  Skin: Skin is warm and dry.  Psychiatric: She has a normal mood and affect.  Nursing note and vitals reviewed.         Assessment & Recommendations:   68 y.o. African American female with hypertension, hyperlipidemia, type 2 DM, with shortness of breath.  Abnormal stress test: While ischemia cannot be excluded, large part of stress test abnormality likely due to tissue attenuation. She is not having any chest pain, shortness of breath symptoms at this time. Continue  current medical management for hypertension, hyperlipidemia, and diabetes. She is currently using indomethacin for gout flare. If NSAIDS become a permanent feature  of her medications, will consider stopping Aspirin. Given lack of symptoms, do not recommend coronary angiography at this time. SL NTG prescribed for as needed use. I will see her for follow up in 3 months.   Cardiac risk stratification: Low cardiac risk for colonoscopy. Okay to proceed.   Elder NegusManish Hickman Jakari Jacot, MD [facility] Pager: [phone] Office: [phone] If no answer Cell [phone]

## 2018-09-18 ENCOUNTER — Other Ambulatory Visit: Payer: Self-pay

## 2018-09-18 ENCOUNTER — Ambulatory Visit (AMBULATORY_SURGERY_CENTER): Payer: Self-pay | Admitting: *Deleted

## 2018-09-18 VITALS — Temp 97.3°F | Ht 63.0 in | Wt 198.0 lb

## 2018-09-18 DIAGNOSIS — Z1211 Encounter for screening for malignant neoplasm of colon: Secondary | ICD-10-CM

## 2018-09-18 MED ORDER — PEG 3350-KCL-NA BICARB-NACL 420 G PO SOLR: 4000.0000 mL | Freq: Once | ORAL | 0 refills | Status: AC

## 2018-09-18 NOTE — Telephone Encounter (Signed)
Patwardhan, Reynold Bowen, MD  Levonne Spiller, RN        Okay to proceed with colonoscopy.   Thanks  MJP

## 2018-09-18 NOTE — Progress Notes (Signed)
Patient is here late for her PV today. patient is going to speak with her PCP about having the colonoscopy. I did explain to her that her Cardiologist cleared her for the colonoscopy. Patient denies any allergies to eggs or soy. Patient denies any problems with anesthesia/sedation. Patient denies any oxygen use at home. Patient denies taking any diet/weight loss medications or blood thinners.Pt is aware that care partner will wait in the car during procedure; if they feel like they will be too hot to wait in the car; they may wait in the lobby.  We want them to wear a mask (we do not have any that we can provide them), practice social distancing, and we will check their temperatures when they get here.  I did remind patient that their care partner needs to stay in the parking lot the entire time. Pt will wear mask into building.

## 2018-09-25 ENCOUNTER — Telehealth: Payer: Self-pay | Admitting: Gastroenterology

## 2018-09-25 NOTE — Telephone Encounter (Signed)
Returned patients call. Patient was concerned that Dulcolax would cause cramping. I told the patient that she may have a little cramping but if she is on clear liquids the day before, that should minimize cramping. Patient verbalizes understanding.   Riki Sheer, LPN ( PV )

## 2018-09-25 NOTE — Telephone Encounter (Signed)
Pt reported that she is afraid to take Dulcolax because her daughter had a bad reaction to it.

## 2018-09-25 NOTE — Telephone Encounter (Signed)
Returned patients call. Patient states she is at work and cannot talk on the phone. Patient states she will call us after work.   Riki Sheer, LPN ( PV )

## 2018-09-26 ENCOUNTER — Encounter: Payer: Self-pay | Admitting: Gastroenterology

## 2018-09-29 ENCOUNTER — Telehealth: Payer: Self-pay | Admitting: Gastroenterology

## 2018-09-29 NOTE — Telephone Encounter (Signed)
Covid-19 screening questions     Do you now or have you had a fever in the last 14 days?   Do you have any respiratory symptoms of shortness of breath or cough now or in the last 14 days?   Do you have any family members or close contacts with diagnosed or suspected Covid-19 in the past 14 days?   Have you been tested for Covid-19 and found to be positive?  Please advise patient to check in on the 4th floor and that care partner may wait in the car or come up to the lobby during procedure. Also make aware that both must enter the building wearing a mask.

## 2018-10-02 ENCOUNTER — Encounter: Payer: Self-pay | Admitting: Gastroenterology

## 2018-10-02 ENCOUNTER — Other Ambulatory Visit: Payer: Self-pay

## 2018-10-02 ENCOUNTER — Ambulatory Visit (AMBULATORY_SURGERY_CENTER): Payer: Medicare HMO | Admitting: Gastroenterology

## 2018-10-02 VITALS — BP 106/55 | HR 61 | Temp 98.4°F | Resp 23 | Ht 63.0 in | Wt 198.0 lb

## 2018-10-02 DIAGNOSIS — Z1211 Encounter for screening for malignant neoplasm of colon: Secondary | ICD-10-CM | POA: Diagnosis not present

## 2018-10-02 MED ORDER — SODIUM CHLORIDE 0.9 % IV SOLN: 500.0000 mL | Freq: Once | INTRAVENOUS | Status: DC

## 2018-10-02 NOTE — Patient Instructions (Signed)
Handout on diverticulosis given   YOU HAD AN ENDOSCOPIC PROCEDURE TODAY AT THE [facility]:   Refer to the procedure report that was given to you for any specific questions about what was found during the examination.  If the procedure report does not answer your questions, please call your gastroenterologist to clarify.  If you requested that your care partner not be given the details of your procedure findings, then the procedure report has been included in a sealed envelope for you to review at your convenience later.  YOU SHOULD EXPECT: Some feelings of bloating in the abdomen. Passage of more gas than usual.  Walking can help get rid of the air that was put into your GI tract during the procedure and reduce the bloating. If you had a lower endoscopy (such as a colonoscopy or flexible sigmoidoscopy) you may notice spotting of blood in your stool or on the toilet paper. If you underwent a bowel prep for your procedure, you may not have a normal bowel movement for a few days.  Please Note:  You might notice some irritation and congestion in your nose or some drainage.  This is from the oxygen used during your procedure.  There is no need for concern and it should clear up in a day or so.  SYMPTOMS TO REPORT IMMEDIATELY:   Following lower endoscopy (colonoscopy or flexible sigmoidoscopy):  Excessive amounts of blood in the stool  Significant tenderness or worsening of abdominal pains  Swelling of the abdomen that is new, acute  Fever of 100F or higher   For urgent or emergent issues, a gastroenterologist can be reached at any hour by calling [phone].   DIET:  We do recommend a small meal at first, but then you may proceed to your regular diet.  Drink plenty of fluids but you should avoid alcoholic beverages for 24 hours.  ACTIVITY:  You should plan to take it easy for the rest of today and you should NOT DRIVE or use heavy machinery until tomorrow (because of the  sedation medicines used during the test).    FOLLOW UP: Our staff will call the number listed on your records 48-72 hours following your procedure to check on you and address any questions or concerns that you may have regarding the information given to you following your procedure. If we do not reach you, we will leave a message.  We will attempt to reach you two times.  During this call, we will ask if you have developed any symptoms of COVID 19. If you develop any symptoms (ie: fever, flu-like symptoms, shortness of breath, cough etc.) before then, please call [phone].  If you test positive for Covid 19 in the 2 weeks post procedure, please call and report this information to us.    If any biopsies were taken you will be contacted by phone or by letter within the next 1-3 weeks.  Please call us at [phone] if you have not heard about the biopsies in 3 weeks.    SIGNATURES/CONFIDENTIALITY: You and/or your care partner have signed paperwork which will be entered into your electronic medical record.  These signatures attest to the fact that that the information above on your After Visit Summary has been reviewed and is understood.  Full responsibility of the confidentiality of this discharge information lies with you and/or your care-partner. 

## 2018-10-02 NOTE — Progress Notes (Signed)
A/ox3, pleased with MAC, report to RN 

## 2018-10-02 NOTE — Progress Notes (Signed)
Pt's states no medical or surgical changes since previsit or office visit.  Covid- June Vitals- Nancy 

## 2018-10-02 NOTE — Op Note (Signed)
[facility] Patient Name: Alice Hickman Procedure Date: 10/02/2018 10:08 AM MRN: [id number] Endoscopist: Pontoosuc. Loletha Carrow , MD Age: 68 Referring MD:  Date of Birth: 1950-09-21 Gender: Female Account #: [id number] Procedure:                Colonoscopy Indications:              Screening for colorectal malignant neoplasm, This                            is the patient's first colonoscopy Medicines:                Monitored Anesthesia Care Procedure:                Pre-Anesthesia Assessment:                           - Prior to the procedure, a History and Physical                            was performed, and patient medications and                            allergies were reviewed. The patient's tolerance of                            previous anesthesia was also reviewed. The risks                            and benefits of the procedure and the sedation                            options and risks were discussed with the patient.                            All questions were answered, and informed consent                            was obtained. Prior Anticoagulants: The patient has                            taken no previous anticoagulant or antiplatelet                            agents. ASA Grade Assessment: II - A patient with                            mild systemic disease. After reviewing the risks                            and benefits, the patient was deemed in                            satisfactory condition to undergo the procedure.  After obtaining informed consent, the colonoscope                            was passed under direct vision. Throughout the                            procedure, the patient's blood pressure, pulse, and                            oxygen saturations were monitored continuously. The                            Colonoscope was introduced through the anus and                            advanced to the the cecum,  identified by                            appendiceal orifice and ileocecal valve. The                            colonoscopy was performed without difficulty. The                            patient tolerated the procedure well. The quality                            of the bowel preparation was good. The ileocecal                            valve, appendiceal orifice, and rectum were                            photographed. The quality of the bowel preparation                            was evaluated using the BBPS [facility]([place] Bowel                            Preparation Scale) with scores of: Right Colon = 2,                            Transverse Colon = 2 and Left Colon = 2. The total                            BBPS score equals 6. Scope In: 10:15:08 AM Scope Out: 10:28:32 AM Scope Withdrawal Time: 0 hours 8 minutes 48 seconds  Total Procedure Duration: 0 hours 13 minutes 24 seconds  Findings:                 The perianal and digital rectal examinations were                            normal.  Multiple diverticula were found in the left colon.                           The exam was otherwise without abnormality on                            direct and retroflexion views. Complications:            No immediate complications. Estimated Blood Loss:     Estimated blood loss: none. Impression:               - Diverticulosis in the left colon.                           - The examination was otherwise normal on direct                            and retroflexion views.                           - No specimens collected. Recommendation:           - Patient has a contact number available for                            emergencies. The signs and symptoms of potential                            delayed complications were discussed with the                            patient. Return to normal activities tomorrow.                            Written discharge instructions were  provided to the                            patient.                           - Resume previous diet.                           - Continue present medications.                           - Repeat colonoscopy in 10 years for screening                            purposes. Rocio Roam L. Myrtie Neither, MD 10/02/2018 10:35:25 AM This report has been signed electronically.

## 2018-10-04 ENCOUNTER — Telehealth: Payer: Self-pay | Admitting: *Deleted

## 2018-10-04 NOTE — Telephone Encounter (Signed)
  Follow up Call-  Call back number 10/02/2018  Post procedure Call Back phone  # [phone]  Permission to leave phone message Yes  Some recent data might be hidden     Patient questions:  Do you have a fever, pain , or abdominal swelling? No. Pain Score  0 *  Have you tolerated food without any problems? Yes.    Have you been able to return to your normal activities? Yes.    Do you have any questions about your discharge instructions: Diet   No. Medications  No. Follow up visit  No.  Do you have questions or concerns about your Care? No.  Actions: * If pain score is 4 or above: No action needed, pain <4.  1. Have you developed a fever since your procedure? NO  2.   Have you had an respiratory symptoms (SOB or cough) since your procedure? NO  3.   Have you tested positive for COVID 19 since your procedure NO  4.   Have you had any family members/close contacts diagnosed with the COVID 19 since your procedure?  NO   If yes to any of these questions please route to Joylene John, RN and Alphonsa Gin, RN.

## 2018-12-18 ENCOUNTER — Ambulatory Visit (INDEPENDENT_AMBULATORY_CARE_PROVIDER_SITE_OTHER): Payer: Medicare HMO | Admitting: Cardiology

## 2018-12-18 ENCOUNTER — Encounter: Payer: Self-pay | Admitting: Cardiology

## 2018-12-18 ENCOUNTER — Other Ambulatory Visit: Payer: Self-pay

## 2018-12-18 VITALS — BP 132/77 | HR 73 | Ht 63.0 in | Wt 188.0 lb

## 2018-12-18 DIAGNOSIS — I10 Essential (primary) hypertension: Secondary | ICD-10-CM

## 2018-12-18 DIAGNOSIS — R9439 Abnormal result of other cardiovascular function study: Secondary | ICD-10-CM | POA: Diagnosis not present

## 2018-12-18 DIAGNOSIS — Z13220 Encounter for screening for lipoid disorders: Secondary | ICD-10-CM | POA: Diagnosis not present

## 2018-12-18 DIAGNOSIS — R0602 Shortness of breath: Secondary | ICD-10-CM | POA: Diagnosis not present

## 2018-12-18 NOTE — Progress Notes (Signed)
Patient referred by Maryagnes Amos,* for shortness of breath  Subjective:   Alice Hickman, female    DOB: [birth year], 68 y.o.   MRN: [id number]   Chief Complaint  Patient presents with  . Hypertension  . Follow-up    3 month  . Results    stress test    HPI  68 y.o. African American female with hypertension, hyperlipidemia, type 2 DM, with shortness of breath.  Patient was in [facility] emergency department on 07/01/2018 with complaints of exertional dyspnea and pain across her shoulders. Work-up showed EKG with nonspecific T wave inversions.  Troponin was mildly elevated and flat at 0.03.  D-dimer was negative.  Chest x-ray showed cardiomegaly without vascular congestion. At baseline, patient walks 30 minutes on treadmill at low speed, without any chest pain or shortness of breath. She is back to working at [employer] and has not had any chest pain or shortness of breath symptoms.  Her stress test showed abnormalities, as detailed below.   Patient has not had any chest pain, shortness of breath symptoms.  She continues to work at [employer], but is planning to retire the end of this year.  Her biggest complaint at this time is bilateral knee pain.  She still tries to walk on treadmill for 5 minutes.  Blood pressure is well controlled.    Past Medical History:  Diagnosis Date  . ALLERGIC RHINITIS 05/01/2007  . Allergy   . ANKLE PAIN, RIGHT 09/02/2009  . ANXIETY, SITUATIONAL 09/02/2009  . CARPAL TUNNEL SYNDROME, RIGHT 11/15/2006  . DM w/o Complication Type II 11/15/2006  . Generalized osteoarthritis 10/27/2012   bilat ankles, knees, hands and wrists  . GERD 11/15/2006  . GLAUCOMA 07/04/2008  . Gout 11/14/2014  . HYPERLIPIDEMIA 05/01/2007  . HYPERTENSION 11/15/2006  . HYPOKALEMIA 10/02/2008  . Low back pain   . OSTEOPENIA 09/02/2009  . Peripheral neuropathy 11/14/2014  . THUMB PAIN, RIGHT 04/02/2009     Past Surgical History:  Procedure Laterality Date  . right shoulder  sugury    . TONSILLECTOMY       Social History   Socioeconomic History  . Marital status: Single    Spouse name: Not on file  . Number of children: 3  . Years of education: Not on file  . Highest education level: Not on file  Occupational History  . Occupation: FLOATER    Employer: [employer]  . Occupation: Education officer, environmental, as well as Water quality scientist  . Financial resource strain: Not on file  . Food insecurity    Worry: Not on file    Inability: Not on file  . Transportation needs    Medical: Not on file    Non-medical: Not on file  Tobacco Use  . Smoking status: Never Smoker  . Smokeless tobacco: Never Used  Substance and Sexual Activity  . Alcohol use: No  . Drug use: No  . Sexual activity: Not on file  Lifestyle  . Physical activity    Days per week: Not on file    Minutes per session: Not on file  . Stress: Not on file  Relationships  . Social Musician on phone: Not on file    Gets together: Not on file    Attends religious service: Not on file    Active member of club or organization: Not on file    Attends meetings of clubs or organizations: Not on file    Relationship  status: Not on file  . Intimate partner violence    Fear of current or ex partner: Not on file    Emotionally abused: Not on file    Physically abused: Not on file    Forced sexual activity: Not on file  Other Topics Concern  . Not on file  Social History Narrative  . Not on file     Family History  Problem Relation Age of Onset  . Heart disease Mother   . Hypertension Mother   . Cancer Brother        throat cancer, smoker  . Heart disease Other   . Hypertension Other   . Cancer Other        lung cancer  . Cancer Brother   . Breast cancer Granddaughter   . Colon cancer Neg Hx   . Colon polyps Neg Hx   . Esophageal cancer Neg Hx   . Stomach cancer Neg Hx   . Rectal cancer Neg Hx      Current Outpatient Medications on File Prior to Visit   Medication Sig Dispense Refill  . aspirin EC 81 MG tablet Take 1 tablet (81 mg total) by mouth daily. 30 tablet 2  . fish oil-omega-3 fatty acids 1000 MG capsule Take 1 g by mouth 2 (two) times a day.     . furosemide (LASIX) 20 MG tablet Take 20 mg by mouth daily.    Marland Kitchen glimepiride (AMARYL) 2 MG tablet TAKE ONE TABLET BY MOUTH ONCE DAILY BEFORE BREAKFAST (Patient taking differently: Take 2 mg by mouth daily with breakfast. ) 90 tablet 0  . olmesartan-hydrochlorothiazide (BENICAR HCT) 40-25 MG tablet TAKE ONE TABLET BY MOUTH ONCE DAILY. (Patient taking differently: Take 1 tablet by mouth daily. ) 30 tablet 5  . rosuvastatin (CRESTOR) 5 MG tablet Take 1 tablet (5 mg total) by mouth at bedtime. 30 tablet 2  . traZODone (DESYREL) 100 MG tablet as needed.    . indomethacin (INDOCIN) 50 MG capsule Take 50 mg by mouth daily.    . nitroGLYCERIN (NITROSTAT) 0.4 MG SL tablet Place 1 tablet (0.4 mg total) under the tongue every 5 (five) minutes as needed for chest pain. (Patient not taking: Reported on 09/18/2018) 90 tablet 3  . PARoxetine (PAXIL) 10 MG tablet Take 10 mg by mouth at bedtime.     No current facility-administered medications on file prior to visit.     Cardiovascular studies:  EKG 12/18/2018: Sinus rhythm 97 bpm. Left axis -anterior fascicular block.   Echocardiogram 09/04/2018: Normal LV systolic function with EF 55%. Left ventricle cavity is normal in size. Mild concentric hypertrophy of the left ventricle. Normal global wall motion. Doppler evidence of grade I (impaired) diastolic dysfunction, normal LAP. Calculated EF 55%. Left atrial cavity is mildly dilated. Right atrial cavity is mildly dilated. Moderate (Grade II) mitral regurgitation. Mild tricuspid regurgitation.  No evidence of pulmonary hypertension.  Lexiscan Myoview stress test 08/16/2018:  Lexiscan stress test was performed. Stress EKG is non-diagnostic, as this is pharmacological stress test. SPECT images reveal  large area of moderate decrease in tracer uptake in mid to basal inferior, inferoseptal and inferolateral myocardium, more prominent on stress than rest images. While breast tissue attenuation could be responsible, ischemia in this area cannot be excluded.  Intermediate risk study.   EKG 07/27/2018: Sinus rhythm 92 bpm.  Poor R wave progression. T wave inversions seen on previous EKG on 07/01/2018 now resolved.   EKG 07/01/2018: Sinus rhythm 53 bpm. Diffuse T  wave inversions. Consider ischemia.    Recent labs: Results for Alice GraffHINTON, Alice J (MRN [id number]) as of 07/27/2018 09:06  Ref. Range 07/01/2018 14:00 07/01/2018 18:19  COMPREHENSIVE METABOLIC PANEL Unknown Rpt (A)   Sodium Latest Ref Range: 135 - 145 mmol/L 139   Potassium Latest Ref Range: 3.5 - 5.1 mmol/L 3.5   Chloride Latest Ref Range: 98 - 111 mmol/L 107   CO2 Latest Ref Range: 22 - 32 mmol/L 26   Glucose Latest Ref Range: 70 - 99 mg/dL 132130 (H)   BUN Latest Ref Range: 8 - 23 mg/dL 11   Creatinine Latest Ref Range: 0.44 - 1.00 mg/dL 4.400.83   Calcium Latest Ref Range: 8.9 - 10.3 mg/dL 9.1   Anion gap Latest Ref Range: 5 - 15  6   Alkaline Phosphatase Latest Ref Range: 38 - 126 U/L 48   Albumin Latest Ref Range: 3.5 - 5.0 g/dL 3.5   Lipase Latest Ref Range: 11 - 51 U/L 20   AST Latest Ref Range: 15 - 41 U/L 17   ALT Latest Ref Range: 0 - 44 U/L 18   Total Protein Latest Ref Range: 6.5 - 8.1 g/dL 7.1   Total Bilirubin Latest Ref Range: 0.3 - 1.2 mg/dL 0.3   GFR, Est Non African American Latest Ref Range: >60 mL/min >60   GFR, Est African American Latest Ref Range: >60 mL/min >60   B Natriuretic Peptide Latest Ref Range: 0.0 - 100.0 pg/mL 149.0 (H)   Troponin I Latest Ref Range: <0.03 ng/mL 0.03 (HH) 0.03 (HH)   Results for Alice GraffHINTON, Alice J (MRN [id number]) as of 07/27/2018 09:06  Ref. Range 07/01/2018 14:00  WBC Latest Ref Range: 4.0 - 10.5 K/uL 5.7  RBC Latest Ref Range: 3.87 - 5.11 MIL/uL 4.31  Hemoglobin Latest Ref Range:  12.0 - 15.0 g/dL 44.012.1  HCT Latest Ref Range: 36.0 - 46.0 % 38.5  MCV Latest Ref Range: 80.0 - 100.0 fL 89.3  MCH Latest Ref Range: 26.0 - 34.0 pg 28.1  MCHC Latest Ref Range: 30.0 - 36.0 g/dL 34.731.4  RDW Latest Ref Range: 11.5 - 15.5 % 13.4  Platelets Latest Ref Range: 150 - 400 K/uL 214  nRBC Latest Ref Range: 0.0 - 0.2 % 0.0    Review of Systems  Constitution: Negative for decreased appetite, malaise/fatigue, weight gain and weight loss.  HENT: Negative for congestion.        Pain neck while swallowing  Eyes: Negative for visual disturbance.  Cardiovascular: Negative for chest pain, leg swelling, palpitations and syncope.  Respiratory: Positive for shortness of breath. Negative for cough.   Endocrine: Negative for cold intolerance.  Hematologic/Lymphatic: Does not bruise/bleed easily.  Skin: Negative for itching and rash.  Musculoskeletal: Positive for arthritis and joint pain, gout flare. Negative for myalgias.  Gastrointestinal: Negative for abdominal pain, nausea and vomiting.  Genitourinary: Negative for dysuria.  Neurological: Negative for dizziness and weakness.  Psychiatric/Behavioral: The patient is not nervous/anxious.   All other systems reviewed and are negative.        Vitals:   12/18/18 1602 12/18/18 1613  BP: (!) 84/70 132/77  Pulse: 96 73  SpO2: 98%      Body mass index is 33.3 kg/m. Filed Weights   12/18/18 1602  Weight: 188 lb (85.3 kg)     Objective:   Physical Exam  Constitutional: She is oriented to person, place, and time. She appears well-developed and well-nourished. No distress.  HENT:  Head: Normocephalic and atraumatic.  Eyes: Pupils are equal, round, and reactive to light. Conjunctivae are normal.  Neck: No JVD present.  No over thyromegaly  Cardiovascular: Normal rate, regular rhythm and intact distal pulses.  Pulmonary/Chest: Effort normal and breath sounds normal. She has no wheezes. She has no rales.  Abdominal: Soft. Bowel  sounds are normal. There is no rebound.  Musculoskeletal:        General: No edema. Left toe swelling, appears gout exacerbation. Lymphadenopathy:    She has no cervical adenopathy.  Neurological: She is alert and oriented to person, place, and time. No cranial nerve deficit.  Skin: Skin is warm and dry.  Psychiatric: She has a normal mood and affect.  Nursing note and vitals reviewed.         Assessment & Recommendations:   68 y.o. African American female with hypertension, hyperlipidemia, type 2 DM, with shortness of breath.  H/o abnormal stress test: While ischemia cannot be excluded, large part of stress test abnormality likely due to tissue attenuation. She is not having any chest pain, shortness of breath symptoms at this time. Continue current medical management for hypertension, hyperlipidemia.  Hypertension: Well controlled  Hyperlipidemia: Check lipid panel.  F/u in 1 year.   Nigel Mormon, MD [facility] Pager: [phone] Office: [phone] If no answer Cell [phone]

## 2019-01-19 ENCOUNTER — Other Ambulatory Visit: Payer: Self-pay | Admitting: Cardiology

## 2019-01-19 DIAGNOSIS — Z20822 Contact with and (suspected) exposure to covid-19: Secondary | ICD-10-CM

## 2019-01-20 LAB — NOVEL CORONAVIRUS, NAA: SARS-CoV-2, NAA: NOT DETECTED

## 2019-05-10 ENCOUNTER — Other Ambulatory Visit: Payer: Self-pay

## 2019-05-10 ENCOUNTER — Encounter (HOSPITAL_COMMUNITY): Payer: Self-pay

## 2019-05-10 ENCOUNTER — Ambulatory Visit (INDEPENDENT_AMBULATORY_CARE_PROVIDER_SITE_OTHER): Payer: Medicare HMO

## 2019-05-10 ENCOUNTER — Ambulatory Visit (HOSPITAL_COMMUNITY)
Admission: EM | Admit: 2019-05-10 | Discharge: 2019-05-10 | Disposition: A | Discharge status: Home / Self Care | Payer: Medicare HMO

## 2019-05-10 DIAGNOSIS — S99921A Unspecified injury of right foot, initial encounter: Secondary | ICD-10-CM

## 2019-05-10 DIAGNOSIS — M79671 Pain in right foot: Secondary | ICD-10-CM

## 2019-05-10 DIAGNOSIS — M25571 Pain in right ankle and joints of right foot: Secondary | ICD-10-CM | POA: Diagnosis not present

## 2019-05-10 MED ORDER — METHOCARBAMOL 500 MG PO TABS: 500.0000 mg | ORAL_TABLET | Freq: Two times a day (BID) | ORAL | 0 refills | Status: DC

## 2019-05-10 MED ORDER — MELOXICAM 7.5 MG PO TABS: 7.5000 mg | ORAL_TABLET | Freq: Every day | ORAL | 0 refills | Status: DC

## 2019-05-10 NOTE — ED Provider Notes (Signed)
[facility]    CSN: [id number] Arrival date & time: 05/10/19  1815      History   Chief Complaint Chief Complaint  Patient presents with  . Foot Injury    HPI Alice Hickman is a 69 y.o. female.   Patient reports right foot pain since last night. Patient states that she dropped an iron skillet on the top of her right foot. Patient complains of swelling, limited range of motion the foot and ankle, bruising as well. Patient has significant medical history of diabetes, neuropathy, osteopenia, and others. Patient denies taking blood thinners, with the exception of aspirin 81 mg daily. Patient has not made attempts to treat her pain with medication, but she has soaked her foot in Epsom salts. Denies headache, cough, shortness of breath, chills, body aches, nausea, vomiting, diarrhea, rash, fever, other symptoms.  ROS per HPI  The history is provided by the patient.    Past Medical History:  Diagnosis Date  . ALLERGIC RHINITIS 05/01/2007  . Allergy   . ANKLE PAIN, RIGHT 09/02/2009  . ANXIETY, SITUATIONAL 09/02/2009  . CARPAL TUNNEL SYNDROME, RIGHT 11/15/2006  . DM w/o Complication Type II 11/15/2006  . Generalized osteoarthritis 10/27/2012   bilat ankles, knees, hands and wrists  . GERD 11/15/2006  . GLAUCOMA 07/04/2008  . Gout 11/14/2014  . HYPERLIPIDEMIA 05/01/2007  . HYPERTENSION 11/15/2006  . HYPOKALEMIA 10/02/2008  . Low back pain   . OSTEOPENIA 09/02/2009  . Peripheral neuropathy 11/14/2014  . THUMB PAIN, RIGHT 04/02/2009    Patient Active Problem List   Diagnosis Date Noted  . Abnormal stress test 09/14/2018  . Abnormal EKG 07/27/2018  . SOB (shortness of breath) 07/06/2018  . Cardiomegaly 07/06/2018  . Peripheral neuropathy 11/14/2014  . Gout 11/14/2014  . Right foot pain 07/18/2014  . Cellulitis of left foot 07/18/2014  . Hypokalemia 05/08/2014  . Pes planus of both feet 05/01/2013  . Osteoarthritis of both knees 12/01/2012  . Swelling of both ankles  10/31/2012  . Generalized osteoarthritis 10/27/2012  . Recurrent falls 09/11/2012  . Screening cholesterol level 05/12/2010  . ANXIETY, SITUATIONAL 09/02/2009  . OSTEOPENIA 09/02/2009  . GLAUCOMA 07/04/2008  . Hyperlipidemia 05/01/2007  . ALLERGIC RHINITIS 05/01/2007  . 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 CARPAL TUNNEL SYNDROME, RIGHT 11/15/2006  . Essential hypertension 11/15/2006  . GERD 11/15/2006    Past Surgical History:  Procedure Laterality Date  . right shoulder sugury    . TONSILLECTOMY      OB History   No obstetric history on file.      Home Medications    Prior to Admission medications   Medication Sig Start Date End Date Taking? Authorizing Provider  aspirin EC 81 MG tablet Take 1 tablet (81 mg total) by mouth daily. 07/27/18 07/27/19 Yes Patwardhan, Manish J, MD  ergocalciferol (VITAMIN D2) 1.25 MG (50000 UT) capsule Vitamin D2 1,250 mcg (50,000 unit) capsule   Yes [provider]  furosemide (LASIX) 20 MG tablet Take 20 mg by mouth daily. 07/11/18  Yes [provider]  glimepiride (AMARYL) 2 MG tablet TAKE ONE TABLET BY MOUTH ONCE DAILY BEFORE BREAKFAST Patient taking differently: Take 2 mg by mouth daily with breakfast.  11/06/15  Yes Corwin Levins, MD  olmesartan-hydrochlorothiazide (BENICAR HCT) 40-25 MG tablet TAKE ONE TABLET BY MOUTH ONCE DAILY. Patient taking differently: Take 1 tablet by mouth daily.  11/06/15  Yes Corwin Levins, MD  rosuvastatin (CRESTOR) 5 MG tablet Take 1  tablet (5 mg total) by mouth at bedtime. 07/27/18 07/27/19 Yes Patwardhan, Manish J, MD  fish oil-omega-3 fatty acids 1000 MG capsule Take 1 g by mouth 2 (two) times a day.     [provider]  indomethacin (INDOCIN) 50 MG capsule Take 50 mg by mouth daily.    [provider]  meloxicam (MOBIC) 7.5 MG tablet Take 1 tablet (7.5 mg total) by mouth daily. 05/10/19   Faustino Congress, NP  methocarbamol (ROBAXIN) 500 MG tablet Take 1 tablet  (500 mg total) by mouth 2 (two) times daily. 05/10/19   Faustino Congress, NP  nitroGLYCERIN (NITROSTAT) 0.4 MG SL tablet Place 1 tablet (0.4 mg total) under the tongue every 5 (five) minutes as needed for chest pain. Patient not taking: Reported on 09/18/2018 09/15/18 12/14/18  Nigel Mormon, MD  PARoxetine (PAXIL) 10 MG tablet Take 10 mg by mouth at bedtime. 10/20/18   [provider]  traZODone (DESYREL) 100 MG tablet as needed. 07/11/18   [provider]    Family History Family History  Problem Relation Age of Onset  . Heart disease Mother   . Hypertension Mother   . Cancer Brother        throat cancer, smoker  . Heart disease Other   . Hypertension Other   . Cancer Other        lung cancer  . Cancer Brother   . Breast cancer Granddaughter   . Colon cancer Neg Hx   . Colon polyps Neg Hx   . Esophageal cancer Neg Hx   . Stomach cancer Neg Hx   . Rectal cancer Neg Hx     Social History Social History   Tobacco Use  . Smoking status: Never Smoker  . Smokeless tobacco: Never Used  Substance Use Topics  . Alcohol use: No  . Drug use: No     Allergies   Lovastatin, Metformin, and Penicillins   Review of Systems Review of Systems   Physical Exam Triage Vital Signs ED Triage Vitals  Enc Vitals Group     BP 05/10/19 1829 (!) 144/69     Pulse Rate 05/10/19 1829 95     Resp 05/10/19 1829 18     Temp 05/10/19 1829 99.4 F (37.4 C)     Temp Source 05/10/19 1829 Oral     SpO2 05/10/19 1829 100 %     Weight --      Height --      Head Circumference --      Peak Flow --      Pain Score 05/10/19 1826 10     Pain Loc --      Pain Edu? --      Excl. in [place]? --    No data found.  Updated Vital Signs BP (!) 144/69 (BP Location: Left Arm)   Pulse 95   Temp 99.4 F (37.4 C) (Oral)   Resp 18   SpO2 100%   Visual Acuity Right Eye Distance:   Left Eye Distance:   Bilateral Distance:    Right Eye Near:   Left Eye Near:    Bilateral  Near:     Physical Exam Vitals and nursing note reviewed.  Constitutional:      General: She is not in acute distress.    Appearance: Normal appearance. She is well-developed. She is obese. She is not ill-appearing.  HENT:     Head: Normocephalic and atraumatic.     Nose: Nose normal.  Mouth/Throat:     Mouth: Mucous membranes are moist.     Pharynx: Oropharynx is clear.  Eyes:     Extraocular Movements: Extraocular movements intact.     Conjunctiva/sclera: Conjunctivae normal.     Pupils: Pupils are equal, round, and reactive to light.  Cardiovascular:     Rate and Rhythm: Normal rate and regular rhythm.     Heart sounds: Normal heart sounds. No murmur.  Pulmonary:     Effort: Pulmonary effort is normal. No respiratory distress.     Breath sounds: Normal breath sounds. No stridor. No wheezing, rhonchi or rales.  Chest:     Chest wall: No tenderness.  Abdominal:     General: Bowel sounds are normal.     Palpations: Abdomen is soft.     Tenderness: There is no abdominal tenderness.  Musculoskeletal:        General: Swelling (Top of right foot) and tenderness (Type of right foot) present.     Cervical back: Normal range of motion and neck supple.  Lymphadenopathy:     Cervical: No cervical adenopathy.  Skin:    General: Skin is warm and dry.     Capillary Refill: Capillary refill takes less than 2 seconds.  Neurological:     General: No focal deficit present.     Mental Status: She is alert and oriented to person, place, and time.  Psychiatric:        Mood and Affect: Mood normal.        Behavior: Behavior normal.        Thought Content: Thought content normal.      UC Treatments / Results  Labs (all labs ordered are listed, but only abnormal results are displayed) Labs Reviewed - No data to display  EKG   Radiology No results found.  Procedures Procedures (including critical care time)  Medications Ordered in UC Medications - No data to  display  Initial Impression / Assessment and Plan / UC Course  I have reviewed the triage vital signs and the nursing notes.  Pertinent labs & imaging results that were available during my care of the patient were reviewed by me and considered in my medical decision making (see chart for details).     Right foot pain: Patient presents with right foot pain since last night after dropping heavy iron skillet onto it. Swelling, tenderness to palpation noted on top of foot and metatarsals. X-ray of the right foot and ankle no bony abnormality or misalignment. Cam walker ordered for patient, patient declines crutches and prefers to use her cane. Instructed that she should follow-up with orthopedics. She was provided with information, phone number and address for an orthopedist during this visit. Prescribed Robaxin 500 mg twice daily as needed muscle spasms. Also prescribed meloxicam 7.5 mg daily for inflammation and pain. Instructed patient not to take ibuprofen with this medication as that can we on occasions. Instructed patient to follow-up with the ER for trouble swallowing, trouble breathing, loss of sensation or strength in the right foot, other concerning symptoms. Patient verbalizes understanding and agrees with treatment plan. Final Clinical Impressions(s) / UC Diagnoses   Final diagnoses:  Right foot pain  Acute right ankle pain  Injury of right foot, initial encounter     Discharge Instructions     You may have sprained your foot. Your xrays were negative for any fractures. You you have a heel spurs that were noted on x-ray as well as some arthritic changes to the  joint at your big toe.  I have sent in an anti-inflammatory as well as a muscle relaxer for you.  Do not take the anti-inflammatory with ibuprofen or other NSAIDs.  I have gotten a boot for you to wear, this will help stabilize your foot and ankle and should help reduce pain.  If you are not feeling better or starting to feel  better over the next week, I would follow-up with orthopedics.  Cannot rule out soft tissue injury just from the x-ray.  If you experience loss of sensation, loss of strength, or other concerning symptoms you may follow-up with the ER.    ED Prescriptions    Medication Sig Dispense Auth. Provider   methocarbamol (ROBAXIN) 500 MG tablet Take 1 tablet (500 mg total) by mouth 2 (two) times daily. 20 tablet Moshe Cipro, NP   meloxicam (MOBIC) 7.5 MG tablet Take 1 tablet (7.5 mg total) by mouth daily. 15 tablet Moshe Cipro, NP     I have reviewed the PDMP during this encounter.   Moshe Cipro, NP 05/12/19 [phone]

## 2019-05-10 NOTE — Discharge Instructions (Addendum)
You may have sprained your foot. Your xrays were negative for any fractures. You you have a heel spurs that were noted on x-ray as well as some arthritic changes to the joint at your big toe.  I have sent in an anti-inflammatory as well as a muscle relaxer for you.  Do not take the anti-inflammatory with ibuprofen or other NSAIDs.  I have gotten a boot for you to wear, this will help stabilize your foot and ankle and should help reduce pain.  If you are not feeling better or starting to feel better over the next week, I would follow-up with orthopedics.  Cannot rule out soft tissue injury just from the x-ray.  If you experience loss of sensation, loss of strength, or other concerning symptoms you may follow-up with the ER.

## 2019-05-10 NOTE — ED Triage Notes (Signed)
Pt c/o pain to right foot. Pt reports dropping an iron skillet on her right foot yesterday. C/o pain to top and lateral aspect of foot and to right ankle. Very limited ROM to ankle. Foot edematous with ecchymosis.

## 2019-12-07 ENCOUNTER — Other Ambulatory Visit: Payer: Self-pay

## 2019-12-07 ENCOUNTER — Ambulatory Visit: Payer: Medicare HMO | Admitting: Cardiology

## 2019-12-07 ENCOUNTER — Encounter: Payer: Self-pay | Admitting: Cardiology

## 2019-12-07 VITALS — BP 128/78 | HR 65 | Resp 16 | Ht 63.0 in | Wt 197.0 lb

## 2019-12-07 DIAGNOSIS — R7303 Prediabetes: Secondary | ICD-10-CM

## 2019-12-07 DIAGNOSIS — I10 Essential (primary) hypertension: Secondary | ICD-10-CM

## 2019-12-07 DIAGNOSIS — R9439 Abnormal result of other cardiovascular function study: Secondary | ICD-10-CM

## 2019-12-07 NOTE — Progress Notes (Signed)
Patient referred by [name],* for shortness of breath  Subjective:   Alice Hickman, female    DOB: [birth year], 69 y.o.   MRN: [id number]   Chief Complaint  Patient presents with  . Shortness of Breath  . Hypertension  . Follow-up    HPI  69 y.o. African American female with hypertension, hyperlipidemia, type 2 DM, with shortness of breath.  Patient was in [facility] emergency department on 07/01/2018 with complaints of exertional dyspnea and pain across her shoulders. Work-up showed EKG with nonspecific T wave inversions.  Troponin was mildly elevated and flat at 0.03.  D-dimer was negative.  Chest x-ray showed cardiomegaly without vascular congestion. At baseline, patient walks 30 minutes on treadmill at low speed, without any chest pain or shortness of breath. She is back to working at [employer] and has not had any chest pain or shortness of breath symptoms.  Her stress test showed abnormalities, as detailed below. However, patient had not had any chest pain, shortness of breath symptoms since the episode in 06/2018. We mutually agreed on holding off nay invasive workup at that time.  Patient is here for 1 year f/u. She is asymptomatic without any chest pain or shortness of breath, with her level of activity. She recently had a foot injury, and is recovering from it.  She has not had any labs recently.   Blood pressure is well controlled.   Current Outpatient Medications on File Prior to Visit  Medication Sig Dispense Refill  . ergocalciferol (VITAMIN D2) 1.25 MG (50000 UT) capsule Vitamin D2 1,250 mcg (50,000 unit) capsule    . fish oil-omega-3 fatty acids 1000 MG capsule Take 1 g by mouth 2 (two) times a day.     . furosemide (LASIX) 20 MG tablet Take 20 mg by mouth daily.    Marland Kitchen glimepiride (AMARYL) 2 MG tablet TAKE ONE TABLET BY MOUTH ONCE DAILY BEFORE BREAKFAST (Patient taking differently: Take 2 mg by mouth daily with breakfast. ) 90 tablet 0  . indomethacin  (INDOCIN) 50 MG capsule Take 50 mg by mouth daily.    . meloxicam (MOBIC) 7.5 MG tablet Take 1 tablet (7.5 mg total) by mouth daily. 15 tablet 0  . methocarbamol (ROBAXIN) 500 MG tablet Take 1 tablet (500 mg total) by mouth 2 (two) times daily. 20 tablet 0  . nitroGLYCERIN (NITROSTAT) 0.4 MG SL tablet Place 1 tablet (0.4 mg total) under the tongue every 5 (five) minutes as needed for chest pain. (Patient not taking: Reported on 09/18/2018) 90 tablet 3  . olmesartan-hydrochlorothiazide (BENICAR HCT) 40-25 MG tablet TAKE ONE TABLET BY MOUTH ONCE DAILY. (Patient taking differently: Take 1 tablet by mouth daily. ) 30 tablet 5  . PARoxetine (PAXIL) 10 MG tablet Take 10 mg by mouth at bedtime.    . rosuvastatin (CRESTOR) 5 MG tablet Take 1 tablet (5 mg total) by mouth at bedtime. 30 tablet 2  . traZODone (DESYREL) 100 MG tablet as needed.     No current facility-administered medications on file prior to visit.    Cardiovascular studies:  EKG 12/07/2019: Sinus rhythm 78 bpm  Left anterior fascicular block.  T-abnormality  -Possible  Anterior ischemia.  Poor R wave progression  Echocardiogram 09/04/2018: Normal LV systolic function with EF 55%. Left ventricle cavity is normal in size. Mild concentric hypertrophy of the left ventricle. Normal global wall motion. Doppler evidence of grade I (impaired) diastolic dysfunction, normal LAP. Calculated EF 55%. Left atrial cavity is mildly  dilated. Right atrial cavity is mildly dilated. Moderate (Grade II) mitral regurgitation. Mild tricuspid regurgitation.  No evidence of pulmonary hypertension.  Lexiscan Myoview stress test 08/16/2018:  Lexiscan stress test was performed. Stress EKG is non-diagnostic, as this is pharmacological stress test. SPECT images reveal large area of moderate decrease in tracer uptake in mid to basal inferior, inferoseptal and inferolateral myocardium, more prominent on stress than rest images. While breast tissue attenuation  could be responsible, ischemia in this area cannot be excluded.  Intermediate risk study.   Recent labs: 06/21/2018: Glucose 130, BUN/Cr 11/0.83. EGFR >60. Na/K 139/3.5. Rest of the CMP normal H/H 12/38. MCV 89. Platelets 214 Chol 187, TG 78, HDL 51, LDL 120 BNP 149  HbA1C 6.6% TSH 1.2 normal   Review of Systems  Cardiovascular: Negative for chest pain, dyspnea on exertion, leg swelling, palpitations and syncope.          Vitals:   12/07/19 1130  BP: 128/78  Pulse: 65  Resp: 16  SpO2: 90%     Body mass index is 34.9 kg/m. Filed Weights   12/07/19 1130  Weight: 197 lb (89.4 kg)     Objective:    Physical Exam Vitals and nursing note reviewed.  Constitutional:      General: She is not in acute distress. Neck:     Vascular: No JVD.  Cardiovascular:     Rate and Rhythm: Normal rate and regular rhythm.     Heart sounds: Normal heart sounds. No murmur heard.   Pulmonary:     Effort: Pulmonary effort is normal.     Breath sounds: Normal breath sounds. No wheezing or rales.          Assessment & Recommendations:   69 y.o. African American female with hypertension, hyperlipidemia, type 2 DM, with shortness of breath.  H/o abnormal stress test: While ischemia cannot be excluded, large part of stress test abnormality likely due to tissue attenuation. She is not having any chest pain, shortness of breath symptoms at this time. Continue current medical management for hypertension, hyperlipidemia. Should she decide to embark on any high intensity exercise regimen, or develop any chest pain or dyspnea symptoms, I would recommend coronary anatomy evaluation-either with CTA or invasive angiography.   Hypertension: Well controlled  Hyperlipidemia: Check lipid panel, A1C today,   F/u in 1 year.   Nigel Mormon, MD [facility] Pager: [phone] Office: [phone] If no answer Cell [phone]

## 2019-12-15 LAB — BASIC METABOLIC PANEL
BUN/Creatinine Ratio: 20 (ref 12–28)
BUN: 25 mg/dL (ref 8–27)
CO2: 26 mmol/L (ref 20–29)
Calcium: 9.5 mg/dL (ref 8.7–10.3)
Chloride: 100 mmol/L (ref 96–106)
Creatinine, Ser: 1.23 mg/dL — ABNORMAL HIGH (ref 0.57–1.00)
GFR calc Af Amer: 52 mL/min/{1.73_m2} — ABNORMAL LOW (ref >59)
GFR calc non Af Amer: 45 mL/min/{1.73_m2} — ABNORMAL LOW (ref >59)
Glucose: 87 mg/dL (ref 65–99)
Potassium: 4.1 mmol/L (ref 3.5–5.2)
Sodium: 137 mmol/L (ref 134–144)

## 2019-12-15 LAB — LIPID PANEL
Chol/HDL Ratio: 3 ratio (ref 0.0–4.4)
Cholesterol, Total: 169 mg/dL (ref 100–199)
HDL: 56 mg/dL (ref >39)
LDL Chol Calc (NIH): 101 mg/dL — ABNORMAL HIGH (ref 0–99)
Triglycerides: 60 mg/dL (ref 0–149)
VLDL Cholesterol Cal: 12 mg/dL (ref 5–40)

## 2019-12-15 LAB — HEMOGLOBIN A1C
Est. average glucose Bld gHb Est-mCnc: 137 mg/dL
Hgb A1c MFr Bld: 6.4 % — ABNORMAL HIGH (ref 4.8–5.6)

## 2019-12-20 ENCOUNTER — Ambulatory Visit: Payer: Medicare HMO | Admitting: Cardiology

## 2019-12-25 ENCOUNTER — Other Ambulatory Visit: Payer: Self-pay

## 2019-12-25 ENCOUNTER — Telehealth: Payer: Self-pay

## 2019-12-25 MED ORDER — ROSUVASTATIN CALCIUM 10 MG PO TABS: 10.0000 mg | ORAL_TABLET | Freq: Every day | ORAL | 2 refills | Status: DC

## 2019-12-25 NOTE — Telephone Encounter (Signed)
done

## 2019-12-25 NOTE — Progress Notes (Signed)
Pt is informed and is aware

## 2020-06-09 ENCOUNTER — Other Ambulatory Visit: Payer: Self-pay

## 2020-06-09 ENCOUNTER — Ambulatory Visit (HOSPITAL_COMMUNITY)
Admission: EM | Admit: 2020-06-09 | Discharge: 2020-06-09 | Disposition: A | Discharge status: Home / Self Care | Payer: Medicare HMO

## 2020-06-09 ENCOUNTER — Ambulatory Visit (INDEPENDENT_AMBULATORY_CARE_PROVIDER_SITE_OTHER): Payer: Medicare HMO

## 2020-06-09 DIAGNOSIS — W208XXA Other cause of strike by thrown, projected or falling object, initial encounter: Secondary | ICD-10-CM | POA: Diagnosis not present

## 2020-06-09 DIAGNOSIS — R2241 Localized swelling, mass and lump, right lower limb: Secondary | ICD-10-CM

## 2020-06-09 DIAGNOSIS — M79671 Pain in right foot: Secondary | ICD-10-CM

## 2020-06-09 MED ORDER — IBUPROFEN 600 MG PO TABS: 600.0000 mg | ORAL_TABLET | Freq: Three times a day (TID) | ORAL | 0 refills | Status: DC

## 2020-06-09 MED ORDER — CYCLOBENZAPRINE HCL 5 MG PO TABS: 5.0000 mg | ORAL_TABLET | Freq: Every day | ORAL | 0 refills | Status: DC

## 2020-06-09 NOTE — ED Triage Notes (Addendum)
Pt dropped 3lb weight on top of right foot 3 days ago and is now experiencing pain and swelling and is not able to put pressure on foot. Pt also reports feeling a "knot"/bump behind knee area on right side since 2 nights ago. Denies pain, denies pain or swelling in calf.

## 2020-06-09 NOTE — Discharge Instructions (Addendum)
Xray of foot did not show any signs of injury or damage   Use ibuprofen three times a day with food for the next 3-4 days then as needed to help with swelling and pain  Can use muscle relaxer at bedtime for additional comfort, be mindful this may make you drowsy  Can follow up for reevaluation after 1 week of consistent use of medications if no improvement seen

## 2020-06-09 NOTE — ED Provider Notes (Signed)
[facility]    CSN: [id number] Arrival date & time: 06/09/20  1021      History   Chief Complaint Chief Complaint  Patient presents with  . Foot Pain  . Knot behind knee    HPI Alice Hickman is a 70 y.o. female.   Patient presents with right foot swelling and pain after dropping 3 lb weight on it while working out at the gym. Weight landed directly on foot then feel and hit lateral side. ROM intact but painful. Able to bear weight but painful. Denies numbness and tingling. Has not attempted treatment.  Concerned with small "knot" behind right knee noticed two days ago. ROM intact. Knot can be mildly felt when flexing leg. Does not interfer with walking.  Denies calf swelling or pain, tenderness, warmth. Denies numbness or tingling, injury to area.   Past Medical History:  Diagnosis Date  . ALLERGIC RHINITIS 05/01/2007  . Allergy   . ANKLE PAIN, RIGHT 09/02/2009  . ANXIETY, SITUATIONAL 09/02/2009  . CARPAL TUNNEL SYNDROME, RIGHT 11/15/2006  . DM w/o Complication Type II 11/15/2006  . Generalized osteoarthritis 10/27/2012   bilat ankles, knees, hands and wrists  . GERD 11/15/2006  . GLAUCOMA 07/04/2008  . Gout 11/14/2014  . HYPERLIPIDEMIA 05/01/2007  . HYPERTENSION 11/15/2006  . HYPOKALEMIA 10/02/2008  . Low back pain   . OSTEOPENIA 09/02/2009  . Peripheral neuropathy 11/14/2014  . THUMB PAIN, RIGHT 04/02/2009    Patient Active Problem List   Diagnosis Date Noted  . Prediabetes 12/07/2019  . Abnormal stress test 09/14/2018  . Abnormal EKG 07/27/2018  . SOB (shortness of breath) 07/06/2018  . Cardiomegaly 07/06/2018  . Peripheral neuropathy 11/14/2014  . Gout 11/14/2014  . Right foot pain 07/18/2014  . Cellulitis of left foot 07/18/2014  . Hypokalemia 05/08/2014  . Pes planus of both feet 05/01/2013  . Osteoarthritis of both knees 12/01/2012  . Swelling of both ankles 10/31/2012  . Generalized osteoarthritis 10/27/2012  . Recurrent falls 09/11/2012  .  Screening cholesterol level 05/12/2010  . ANXIETY, SITUATIONAL 09/02/2009  . OSTEOPENIA 09/02/2009  . GLAUCOMA 07/04/2008  . Hyperlipidemia 05/01/2007  . ALLERGIC RHINITIS 05/01/2007  . 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 CARPAL TUNNEL SYNDROME, RIGHT 11/15/2006  . Essential hypertension 11/15/2006  . GERD 11/15/2006    Past Surgical History:  Procedure Laterality Date  . right shoulder sugury    . TONSILLECTOMY      OB History   No obstetric history on file.      Home Medications    Prior to Admission medications   Medication Sig Start Date End Date Taking? Authorizing Provider  aspirin 81 MG chewable tablet Chew by mouth every other day.   Yes [provider]  cyclobenzaprine (FLEXERIL) 5 MG tablet Take 1 tablet (5 mg total) by mouth at bedtime. 06/09/20  Yes Phyllis Abelson R, NP  ergocalciferol (VITAMIN D2) 1.25 MG (50000 UT) capsule Vitamin D2 1,250 mcg (50,000 unit) capsule   Yes [provider]  fish oil-omega-3 fatty acids 1000 MG capsule Take 1 g by mouth 2 (two) times a day.    Yes [provider]  furosemide (LASIX) 20 MG tablet Take 20 mg by mouth daily. 07/11/18  Yes [provider]  glimepiride (AMARYL) 2 MG tablet TAKE ONE TABLET BY MOUTH ONCE DAILY BEFORE BREAKFAST Patient taking differently: Take 2 mg by mouth daily with breakfast. 11/06/15  Yes Corwin Levins, MD  ibuprofen (ADVIL) 600 MG tablet  Take 1 tablet (600 mg total) by mouth 3 (three) times daily. 06/09/20  Yes Renad Jenniges R, NP  nitroGLYCERIN (NITROSTAT) 0.4 MG SL tablet Place 1 tablet (0.4 mg total) under the tongue every 5 (five) minutes as needed for chest pain. 09/15/18 12/07/19 Yes Patwardhan, Manish J, MD  rosuvastatin (CRESTOR) 10 MG tablet Take 1 tablet (10 mg total) by mouth daily. 12/25/19  Yes Patwardhan, Manish J, MD  indomethacin (INDOCIN) 50 MG capsule Take 50 mg by mouth daily.    [provider]  meloxicam (MOBIC) 7.5 MG tablet  Take 1 tablet (7.5 mg total) by mouth daily. 05/10/19   Moshe CiproMatthews, Stephanie, NP  olmesartan-hydrochlorothiazide (BENICAR HCT) 40-25 MG tablet TAKE ONE TABLET BY MOUTH ONCE DAILY. Patient taking differently: Take 1 tablet by mouth daily.  11/06/15   [name], [name], MD  traZODone (DESYREL) 100 MG tablet as needed. 07/11/18   [provider]    Family History Family History  Problem Relation Age of Onset  . Heart disease Mother   . Hypertension Mother   . Cancer Brother        throat cancer, smoker  . Heart disease Other   . Hypertension Other   . Cancer Other        lung cancer  . Cancer Brother   . Breast cancer Granddaughter   . Colon cancer Neg Hx   . Colon polyps Neg Hx   . Esophageal cancer Neg Hx   . Stomach cancer Neg Hx   . Rectal cancer Neg Hx     Social History Social History   Tobacco Use  . Smoking status: Never Smoker  . Smokeless tobacco: Never Used  Vaping Use  . Vaping Use: Never used  Substance Use Topics  . Alcohol use: No  . Drug use: No     Allergies   Lovastatin, Metformin, and Penicillins   Review of Systems Review of Systems  Constitutional: Negative.   HENT: Negative.   Respiratory: Negative.   Cardiovascular: Negative.   Musculoskeletal: Positive for joint swelling. Negative for arthralgias, back pain, gait problem, myalgias, neck pain and neck stiffness.  Skin: Negative.   Neurological: Negative.      Physical Exam Triage Vital Signs ED Triage Vitals  Enc Vitals Group     BP 06/09/20 1148 (!) 128/55     Pulse Rate 06/09/20 1148 79     Resp 06/09/20 1148 18     Temp 06/09/20 1148 98.2 F (36.8 C)     Temp src --      SpO2 06/09/20 1148 96 %     Weight --      Height --      Head Circumference --      Peak Flow --      Pain Score 06/09/20 1141 10     Pain Loc --      Pain Edu? --      Excl. in GC? --    No data found.  Updated Vital Signs BP (!) 128/55   Pulse 79   Temp 98.2 F (36.8 C)   Resp 18   SpO2 96%    Visual Acuity Right Eye Distance:   Left Eye Distance:   Bilateral Distance:    Right Eye Near:   Left Eye Near:    Bilateral Near:     Physical Exam Constitutional:      Appearance: Normal appearance. She is normal weight.  HENT:     Head: Normocephalic.  Eyes:     Extraocular Movements: Extraocular movements intact.  Pulmonary:     Effort: Pulmonary effort is normal.  Musculoskeletal:     Cervical back: Normal range of motion.     Right knee: Normal.     Left knee: Normal.     Right ankle: Swelling present. No ecchymosis. Tenderness present. No ATF ligament or AITF ligament tenderness. Normal range of motion.     Right Achilles Tendon: Normal.     Left ankle: Normal.     Right foot: Normal range of motion. Tenderness present. No swelling. Normal pulse.     Left foot: Normal.       Legs:     Comments: 1x1 cm Firm mobile nodule located along lateral knee, non tender, no swelling to area noted   Skin:    General: Skin is warm and dry.  Neurological:     Mental Status: She is alert and oriented to person, place, and time. Mental status is at baseline.  Psychiatric:        Mood and Affect: Mood normal.        Behavior: Behavior normal.        Thought Content: Thought content normal.        Judgment: Judgment normal.      UC Treatments / Results  Labs (all labs ordered are listed, but only abnormal results are displayed) Labs Reviewed - No data to display  EKG   Radiology DG Foot Complete Right  Result Date: 06/09/2020 CLINICAL DATA:  Dropped weight on right foot.  Pain, swelling EXAM: RIGHT FOOT COMPLETE - 3+ VIEW COMPARISON:  None. FINDINGS: Degenerative changes at the 1st MTP joint with joint space narrowing and spurring. Irregularity at the base of the 5th metatarsal area command clinical correlation for pain in this area. No additional suspicious bony abnormality. Plantar and posterior calcaneal spurs. IMPRESSION: Irregularity at the base of the 5th  metatarsal. Recommend clinical correlation for pain in this area. Electronically Signed   By: Charlett Nose M.D.   On: 06/09/2020 12:15    Procedures Procedures (including critical care time)  Medications Ordered in UC Medications - No data to display  Initial Impression / Assessment and Plan / UC Course  I have reviewed the triage vital signs and the nursing notes.  Pertinent labs & imaging results that were available during my care of the patient were reviewed by me and considered in my medical decision making (see chart for details).  Acute right foot pain  1. Xray- negative 2. Ibuprofen 600 mg tid 3. Flexeril 5mg  at bedtime prn  4. Discussed bearing weight and follow up for persistent symptoms 5. Nodule on knee seems benign, discussed watchful waiting, patient in agreement with plan, discussed when to follow up  Final Clinical Impressions(s) / UC Diagnoses   Final diagnoses:  None     Discharge Instructions     Xray of foot did not show any signs of injury or damage   Use ibuprofen three times a day with food for the next 3-4 days then as needed to help with swelling and pain  Can use muscle relaxer at bedtime for additional comfort, be mindful this may make you drowsy  Can follow up for reevaluation after 1 week of consistent use of medications if no improvement seen   ED Prescriptions    Medication Sig Dispense Auth. Provider   ibuprofen (ADVIL) 600 MG tablet Take 1 tablet (600 mg total) by mouth 3 (three) times daily.  30 tablet Babygirl Trager R, NP   cyclobenzaprine (FLEXERIL) 5 MG tablet Take 1 tablet (5 mg total) by mouth at bedtime. 15 tablet Johana Hopkinson, Elita Boone, NP     PDMP not reviewed this encounter.   Valinda Hoar, NP 06/09/20 1359

## 2020-09-10 ENCOUNTER — Ambulatory Visit (HOSPITAL_COMMUNITY)
Admission: EM | Admit: 2020-09-10 | Discharge: 2020-09-10 | Disposition: A | Discharge status: Home / Self Care | Payer: Medicare HMO | Attending: Emergency Medicine | Admitting: Emergency Medicine

## 2020-09-10 ENCOUNTER — Other Ambulatory Visit: Payer: Self-pay

## 2020-09-10 ENCOUNTER — Ambulatory Visit (INDEPENDENT_AMBULATORY_CARE_PROVIDER_SITE_OTHER): Payer: Medicare HMO

## 2020-09-10 ENCOUNTER — Encounter (HOSPITAL_COMMUNITY): Payer: Self-pay

## 2020-09-10 DIAGNOSIS — M19041 Primary osteoarthritis, right hand: Secondary | ICD-10-CM

## 2020-09-10 DIAGNOSIS — M79644 Pain in right finger(s): Secondary | ICD-10-CM | POA: Diagnosis not present

## 2020-09-10 MED ORDER — IBUPROFEN 600 MG PO TABS: 600.0000 mg | ORAL_TABLET | Freq: Four times a day (QID) | ORAL | 0 refills | Status: DC | PRN

## 2020-09-10 NOTE — ED Provider Notes (Signed)
[facility]    CSN: [id number] Arrival date & time: 09/10/20  1402      History   Chief Complaint Chief Complaint  Patient presents with   finger swelling    HPI Alice Hickman is a 70 y.o. female.   Patient here for evaluation of right index finger swelling that started this am.  Reports working outside and may have been stuck by a stick or thorn but does not believe she was bitten by any insects.  Reports warm to touch and some tenderness to touch.  Has not taken any OTC medications or treatments.  Reports history of gout and arthritis but denies having it in her hands previously. Denies any specific alleviating or aggravating factors.  Denies any fevers, chest pain, shortness of breath, N/V/D, numbness, tingling, weakness, abdominal pain, or headaches.    The history is provided by the patient.   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 Type II 11/15/2006   Generalized osteoarthritis 10/27/2012   bilat ankles, knees, hands and wrists   GERD 11/15/2006   GLAUCOMA 07/04/2008   Gout 11/14/2014   HYPERLIPIDEMIA 05/01/2007   HYPERTENSION 11/15/2006   HYPOKALEMIA 10/02/2008   Low back pain    OSTEOPENIA 09/02/2009   Peripheral neuropathy 11/14/2014   THUMB PAIN, RIGHT 04/02/2009    Patient Active Problem List   Diagnosis Date Noted   Prediabetes 12/07/2019   Abnormal stress test 09/14/2018   Abnormal EKG 07/27/2018   SOB (shortness of breath) 07/06/2018   Cardiomegaly 07/06/2018   Peripheral neuropathy 11/14/2014   Gout 11/14/2014   Right foot pain 07/18/2014   Cellulitis of left foot 07/18/2014   Hypokalemia 05/08/2014   Pes planus of both feet 05/01/2013   Osteoarthritis of both knees 12/01/2012   Swelling of both ankles 10/31/2012   Generalized osteoarthritis 10/27/2012   Recurrent falls  09/11/2012   Screening cholesterol level 05/12/2010   ANXIETY, SITUATIONAL 09/02/2009   OSTEOPENIA 09/02/2009   GLAUCOMA 07/04/2008   Hyperlipidemia 05/01/2007   ALLERGIC RHINITIS 05/01/2007   Diabetes mellitus with neuropathy (HCC) 11/15/2006   CARPAL TUNNEL SYNDROME, RIGHT 11/15/2006   Essential hypertension 11/15/2006   GERD 11/15/2006    Past Surgical History:  Procedure Laterality Date   right shoulder sugury     TONSILLECTOMY      OB History   No obstetric history on file.      Home Medications    Prior to Admission medications   Medication Sig Start Date End Date Taking? Authorizing Provider  ibuprofen (ADVIL) 600 MG tablet Take 1 tablet (600 mg total) by mouth every 6 (six) hours as needed. 09/10/20  Yes Ivette Loyal, NP  aspirin 81 MG chewable tablet Chew by mouth every other day.    [provider]  cyclobenzaprine (FLEXERIL) 5 MG tablet Take 1 tablet (5 mg total) by mouth at bedtime. 06/09/20   White, Elita Boone, NP  ergocalciferol (VITAMIN D2) 1.25 MG (50000 UT) capsule Vitamin D2 1,250 mcg (50,000 unit) capsule    [provider]  fish oil-omega-3 fatty acids 1000 MG capsule Take 1 g by mouth 2 (two) times a day.     [provider]  furosemide (LASIX) 20 MG tablet Take 20 mg by mouth daily. 07/11/18   [provider]  glimepiride (AMARYL) 2  MG tablet TAKE ONE TABLET BY MOUTH ONCE DAILY BEFORE BREAKFAST Patient taking differently: Take 2 mg by mouth daily with breakfast. 11/06/15   Corwin Levins, MD  indomethacin (INDOCIN) 50 MG capsule Take 50 mg by mouth daily.    [provider]  meloxicam (MOBIC) 7.5 MG tablet Take 1 tablet (7.5 mg total) by mouth daily. 05/10/19   Moshe Cipro, NP  nitroGLYCERIN (NITROSTAT) 0.4 MG SL tablet Place 1 tablet (0.4 mg total) under the tongue every 5 (five) minutes as needed for chest pain. 09/15/18 12/07/19  Patwardhan, Anabel Bene, MD  olmesartan-hydrochlorothiazide (BENICAR HCT) 40-25 MG  tablet TAKE ONE TABLET BY MOUTH ONCE DAILY. Patient taking differently: Take 1 tablet by mouth daily.  11/06/15   Corwin Levins, MD  rosuvastatin (CRESTOR) 10 MG tablet Take 1 tablet (10 mg total) by mouth daily. 12/25/19   Patwardhan, Anabel Bene, MD  traZODone (DESYREL) 100 MG tablet as needed. 07/11/18   [provider]    Family History Family History  Problem Relation Age of Onset   Heart disease Mother    Hypertension Mother    Cancer Brother        throat cancer, smoker   Heart disease Other    Hypertension Other    Cancer Other        lung cancer   Cancer Brother    Breast cancer Granddaughter    Colon cancer Neg Hx    Colon polyps Neg Hx    Esophageal cancer Neg Hx    Stomach cancer Neg Hx    Rectal cancer Neg Hx     Social History Social History   Tobacco Use   Smoking status: Never   Smokeless tobacco: Never  Vaping Use   Vaping Use: Never used  Substance Use Topics   Alcohol use: No   Drug use: No     Allergies   Lovastatin, Metformin, and Penicillins   Review of Systems Review of Systems  Musculoskeletal:  Positive for joint swelling.  All other systems reviewed and are negative.   Physical Exam Triage Vital Signs ED Triage Vitals [09/10/20 1534]  Enc Vitals Group     BP 136/80     Pulse Rate 66     Resp 16     Temp 98.5 F (36.9 C)     Temp Source Oral     SpO2 99 %     Weight      Height      Head Circumference      Peak Flow      Pain Score      Pain Loc      Pain Edu?      Excl. in GC?    No data found.  Updated Vital Signs BP 136/80 (BP Location: Left Wrist)   Pulse 66   Temp 98.5 F (36.9 C) (Oral)   Resp 16   SpO2 99%   Visual Acuity Right Eye Distance:   Left Eye Distance:   Bilateral Distance:    Right Eye Near:   Left Eye Near:    Bilateral Near:     Physical Exam Vitals and nursing note reviewed.  Constitutional:      General: She is not in acute distress.    Appearance: Normal appearance. She is  not ill-appearing, toxic-appearing or diaphoretic.  HENT:     Head: Normocephalic and atraumatic.  Eyes:     Conjunctiva/sclera: Conjunctivae normal.  Cardiovascular:     Rate and  Rhythm: Normal rate.     Pulses: Normal pulses.  Pulmonary:     Effort: Pulmonary effort is normal.  Abdominal:     General: Abdomen is flat.  Musculoskeletal:     Right hand: Swelling and tenderness present. No bony tenderness. Decreased range of motion (due to pain and swelling). Normal strength. Normal sensation.     Cervical back: Normal range of motion.  Skin:    General: Skin is warm and dry.  Neurological:     General: No focal deficit present.     Mental Status: She is alert and oriented to person, place, and time.  Psychiatric:        Mood and Affect: Mood normal.        UC Treatments / Results  Labs (all labs ordered are listed, but only abnormal results are displayed) Labs Reviewed - No data to display  EKG   Radiology DG Finger Index Right  Result Date: 09/10/2020 CLINICAL DATA:  Pain and swelling index finger. EXAM: RIGHT INDEX FINGER 2+V COMPARISON:  None. FINDINGS: Moderate osteoarthritis in the second PIP joint with joint space narrowing and osteophyte formation. There is surrounding soft tissue swelling. No erosion. Degenerative change also in the second D IP joint. IMPRESSION: Osteoarthritis of the second PIP and D IP joint without erosion. Soft tissue swelling around the second PIP joint. Electronically Signed   By: Marlan Palau M.D.   On: 09/10/2020 16:45    Procedures Procedures (including critical care time)  Medications Ordered in UC Medications - No data to display  Initial Impression / Assessment and Plan / UC Course  I have reviewed the triage vital signs and the nursing notes.  Pertinent labs & imaging results that were available during my care of the patient were reviewed by me and considered in my medical decision making (see chart for details).     Assessment negative for red flags or concerns.  Xray shows osteoarthritis of the PIP and DIP joint.  Will treat with Ibuprofen 600mg  as needed for pain. Discussed conservative symptom management.  Follow up with primary care for re-evaluation.   Final Clinical Impressions(s) / UC Diagnoses   Final diagnoses:  Osteoarthritis of right index finger     Discharge Instructions      Take the ibuprofen as needed for pain.   You can try heat, ice, or alternate between heat and ice.  You can try biofreeze, bengay cream, or Voltargen gel for pain relief.   Follow up with your primary care provider for re-evaluation as soon as possible.      ED Prescriptions     Medication Sig Dispense Auth. Provider   ibuprofen (ADVIL) 600 MG tablet Take 1 tablet (600 mg total) by mouth every 6 (six) hours as needed. 30 tablet , NP      PDMP not reviewed this encounter.   Ivette Loyal, NP 09/10/20 [phone]

## 2020-09-10 NOTE — ED Triage Notes (Signed)
Pt in with c/o right index finger swelling that she noticed today  States she did yard work yesterday and noticed she was poked by something  Pt states her finger feels warm to touch, but she doesn't remember being bitten by anything

## 2020-09-10 NOTE — Discharge Instructions (Addendum)
Take the ibuprofen as needed for pain.   You can try heat, ice, or alternate between heat and ice.  You can try biofreeze, bengay cream, or Voltargen gel for pain relief.   Follow up with your primary care provider for re-evaluation as soon as possible.

## 2020-11-09 IMAGING — CR CHEST - 2 VIEW
2 series · 2 of 2 positions shown · non-contrast
Comparison: PA and lateral chest 07/17/2015.

CLINICAL DATA: Shortness of breath for 3 days, worse this morning.

EXAM:
CHEST - 2 VIEW

[w chest lat]
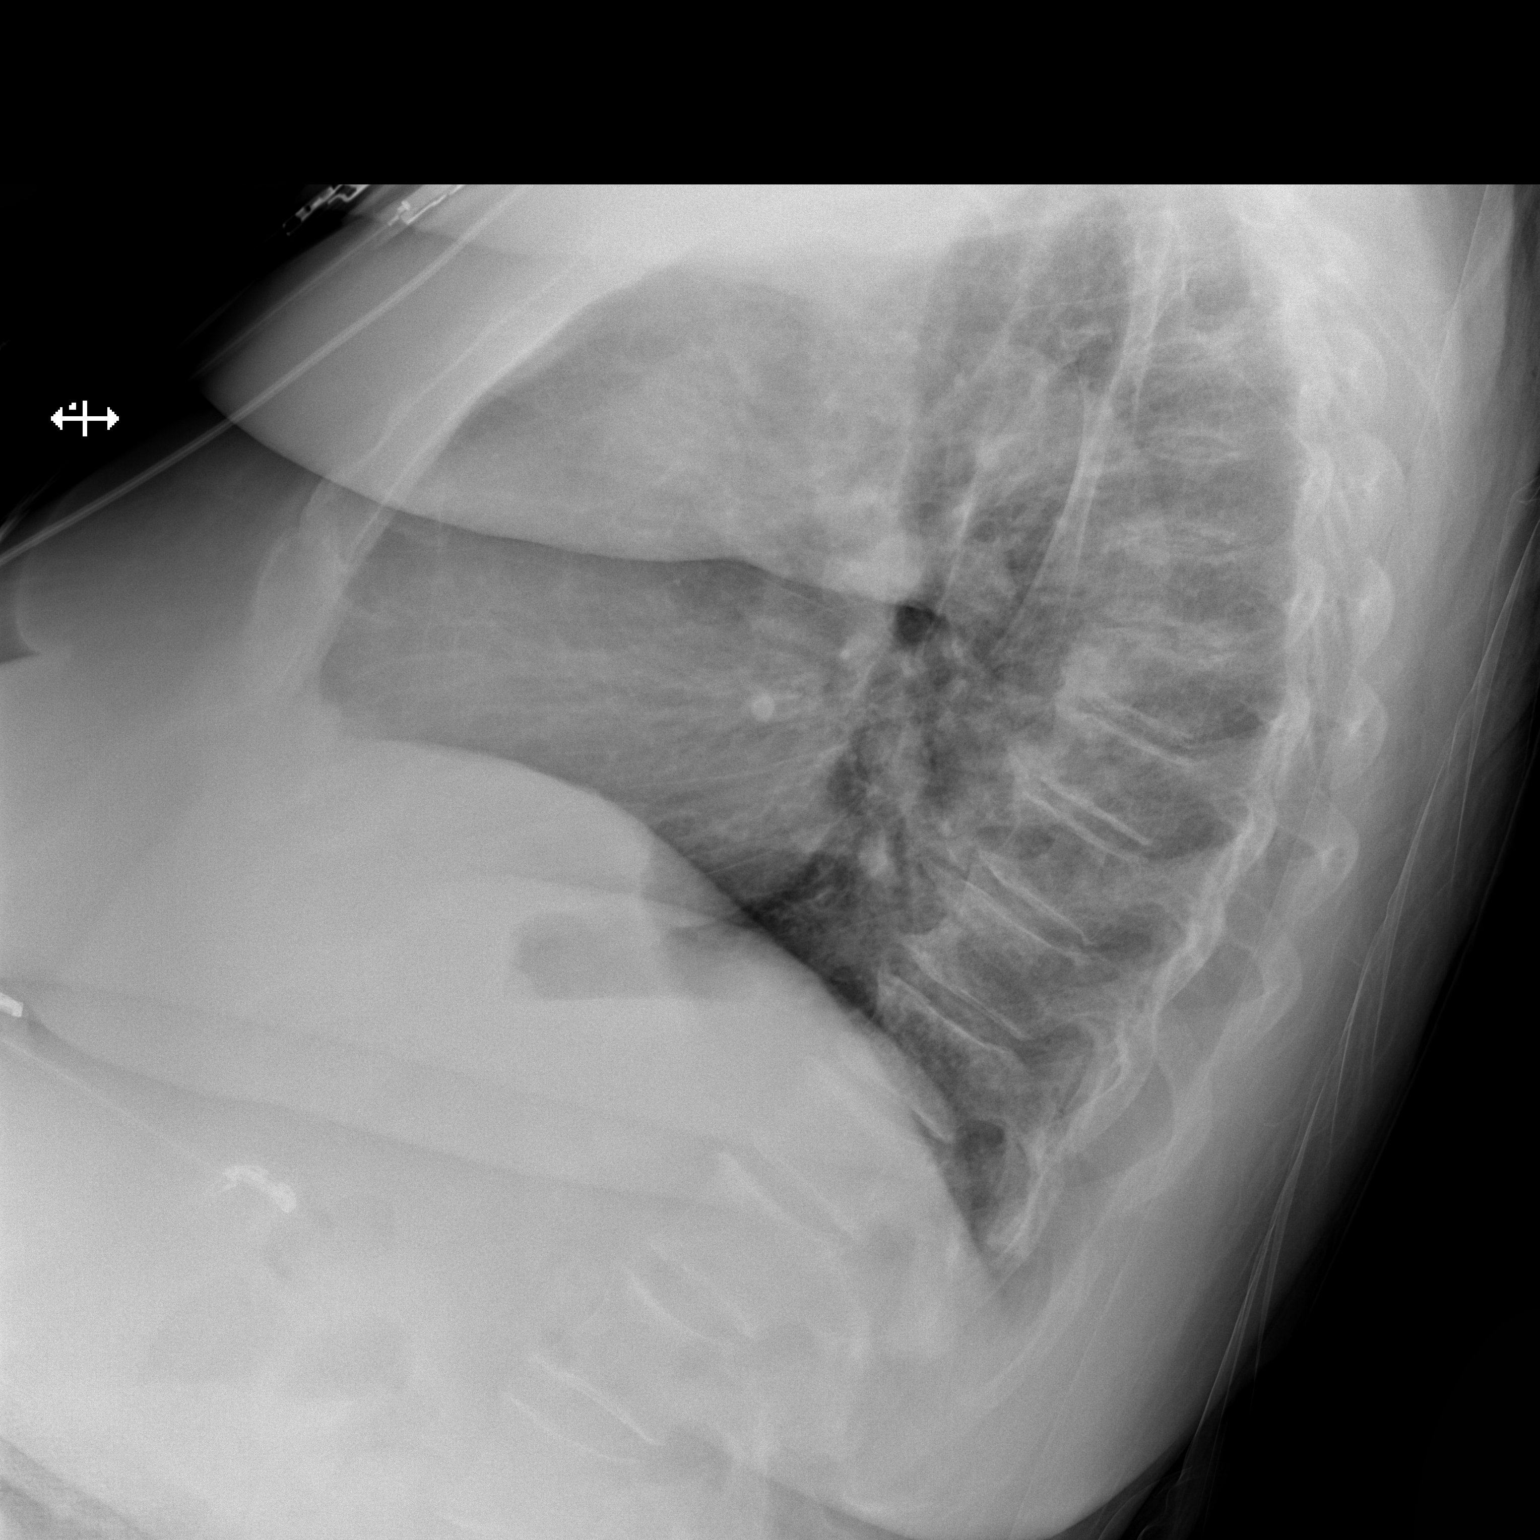

[x chest ap]
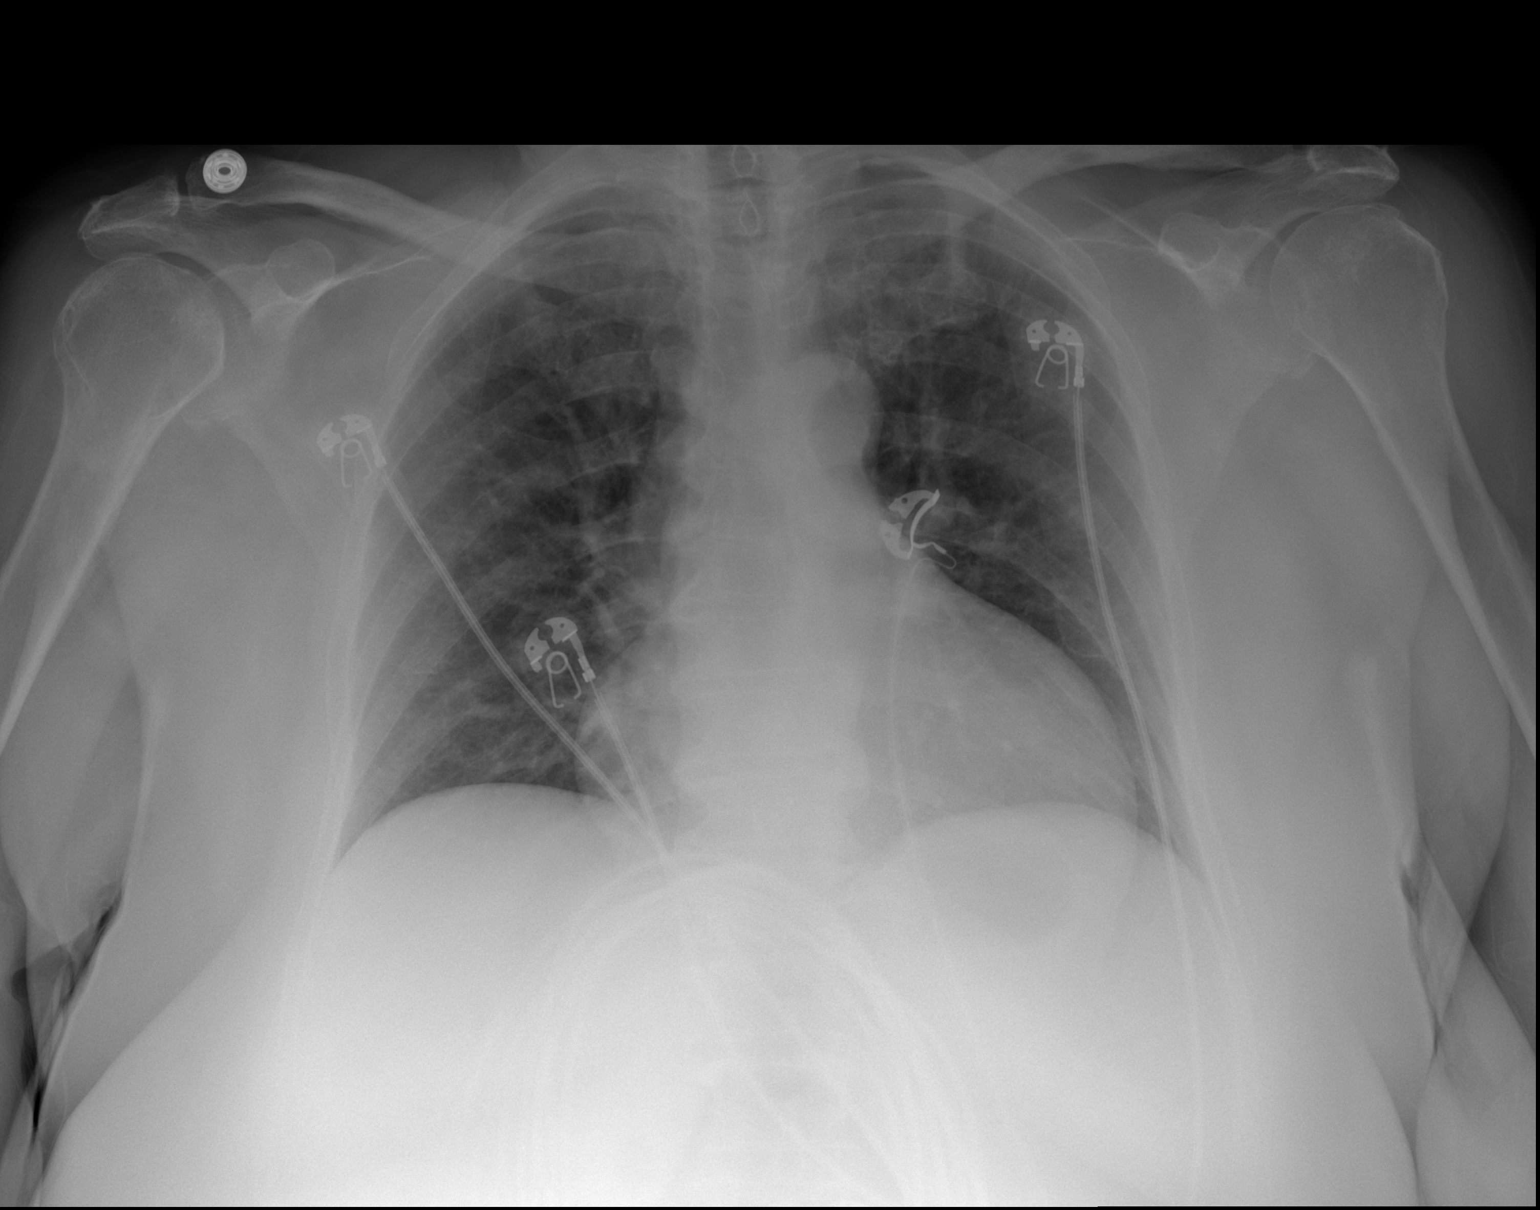

[2 of 2 positions shown; findings below may reference images not displayed]

FINDINGS: There is cardiomegaly and vascular congestion without edema. No
consolidative process, pneumothorax or effusion. Aortic
atherosclerosis noted. No acute or focal bony abnormality.
IMPRESSION: Cardiomegaly and pulmonary vascular congestion without edema.

Atherosclerosis.

## 2020-12-08 ENCOUNTER — Ambulatory Visit: Payer: Medicare HMO | Admitting: Cardiology

## 2020-12-08 ENCOUNTER — Encounter: Payer: Self-pay | Admitting: Cardiology

## 2020-12-08 ENCOUNTER — Other Ambulatory Visit: Payer: Self-pay

## 2020-12-08 VITALS — BP 178/84 | HR 71 | Temp 97.8°F | Resp 16 | Ht 63.0 in | Wt 178.0 lb

## 2020-12-08 DIAGNOSIS — R9439 Abnormal result of other cardiovascular function study: Secondary | ICD-10-CM

## 2020-12-08 DIAGNOSIS — E782 Mixed hyperlipidemia: Secondary | ICD-10-CM

## 2020-12-08 DIAGNOSIS — I10 Essential (primary) hypertension: Secondary | ICD-10-CM

## 2020-12-08 MED ORDER — AMLODIPINE BESYLATE 5 MG PO TABS: 5.0000 mg | ORAL_TABLET | Freq: Every day | ORAL | 3 refills | Status: DC

## 2020-12-08 MED ORDER — ROSUVASTATIN CALCIUM 20 MG PO TABS: 10.0000 mg | ORAL_TABLET | Freq: Every day | ORAL | 2 refills | Status: DC

## 2020-12-08 NOTE — Progress Notes (Signed)
  Patient referred by Starkes-Perry, Takia S,* for shortness of breath  Subjective:   Alice Hickman, female    DOB: 01/15/1951, 70 y.o.   MRN: [id number]  Chief Complaint  Patient presents with   Shortness of Breath   Hypertension   Follow-up    1 year   Results    Stress test    HPI  70 y.o. African American female with hypertension, hyperlipidemia, type 2 DM  Patient is now retired, does not do any regular physical activity, She has recently joined a gym, but has not started exercise yet. Blood pressure is uncontrolled. She is currently not taking statin.   Office note 12/2019: Patient was in [facility] emergency department on 07/01/2018 with complaints of exertional dyspnea and pain across her shoulders. Work-up showed EKG with nonspecific T wave inversions.  Troponin was mildly elevated and flat at 0.03.  D-dimer was negative.  Chest x-ray showed cardiomegaly without vascular congestion. At baseline, patient walks 30 minutes on treadmill at low speed, without any chest pain or shortness of breath. She is back to working at [employer] and has not had any chest pain or shortness of breath symptoms.  Her stress test showed abnormalities, as detailed below. However, patient had not had any chest pain, shortness of breath symptoms since the episode in 06/2018. We mutually agreed on holding off nay invasive workup at that time.  Patient is here for 1 year f/u. She is asymptomatic without any chest pain or shortness of breath, with her level of activity. She recently had a foot injury, and is recovering from it.  She has not had any labs recently.   Blood pressure is well controlled.   Current Outpatient Medications on File Prior to Visit  Medication Sig Dispense Refill   aspirin 81 MG chewable tablet Chew by mouth every other day.     cyclobenzaprine (FLEXERIL) 5 MG tablet Take 1 tablet (5 mg total) by mouth at bedtime. 15 tablet 0   ergocalciferol (VITAMIN D2) 1.25 MG (50000 UT)  capsule Vitamin D2 1,250 mcg (50,000 unit) capsule     fish oil-omega-3 fatty acids 1000 MG capsule Take 1 g by mouth 2 (two) times a day.      furosemide (LASIX) 20 MG tablet Take 20 mg by mouth daily.     glimepiride (AMARYL) 2 MG tablet TAKE ONE TABLET BY MOUTH ONCE DAILY BEFORE BREAKFAST (Patient taking differently: Take 2 mg by mouth daily with breakfast.) 90 tablet 0   ibuprofen (ADVIL) 600 MG tablet Take 1 tablet (600 mg total) by mouth every 6 (six) hours as needed. 30 tablet 0   indomethacin (INDOCIN) 50 MG capsule Take 50 mg by mouth daily.     meloxicam (MOBIC) 7.5 MG tablet Take 1 tablet (7.5 mg total) by mouth daily. 15 tablet 0   nitroGLYCERIN (NITROSTAT) 0.4 MG SL tablet Place 1 tablet (0.4 mg total) under the tongue every 5 (five) minutes as needed for chest pain. 90 tablet 3   olmesartan-hydrochlorothiazide (BENICAR HCT) 40-25 MG tablet TAKE ONE TABLET BY MOUTH ONCE DAILY. (Patient taking differently: Take 1 tablet by mouth daily. ) 30 tablet 5   rosuvastatin (CRESTOR) 10 MG tablet Take 1 tablet (10 mg total) by mouth daily. 90 tablet 2   traZODone (DESYREL) 100 MG tablet as needed.     No current facility-administered medications on file prior to visit.    Cardiovascular studies:  EKG 12/08/2020: Sinus rhythm 60 bpm Occasional ectopic ventricular beat      Poor R-wave progression Nonspecific T-abnormality  Echocardiogram 09/04/2018: Normal LV systolic function with EF 55%. Left ventricle cavity is normal in size. Mild concentric hypertrophy of the left ventricle. Normal global wall motion. Doppler evidence of grade I (impaired) diastolic dysfunction, normal LAP. Calculated EF 55%. Left atrial cavity is mildly dilated. Right atrial cavity is mildly dilated. Moderate (Grade II) mitral regurgitation. Mild tricuspid regurgitation.  No evidence of pulmonary hypertension.  Lexiscan Myoview stress test 08/16/2018:  Lexiscan stress test was performed. Stress EKG is  non-diagnostic, as this is pharmacological stress test. SPECT images reveal large area of moderate decrease in tracer uptake in mid to basal inferior, inferoseptal and inferolateral myocardium, more prominent on stress than rest images. While breast tissue attenuation could be responsible, ischemia in this area cannot be excluded.  Intermediate risk study.   Recent labs: 12/14/2019: Glucose 87, BUN/Cr 25/1.23. EGFR 52. Na/K 137/4.1.  HbA1C 6.4% Chol 169, TG 60, HDL 56, LDL 101 TSH N/A  06/21/2018: Glucose 130, BUN/Cr 11/0.83. EGFR >60. Na/K 139/3.5. Rest of the CMP normal H/H 12/38. MCV 89. Platelets 214 Chol 187, TG 78, HDL 51, LDL 120 BNP 149  HbA1C 6.6% TSH 1.2 normal   Review of Systems  Cardiovascular:  Negative for chest pain, dyspnea on exertion, leg swelling, palpitations and syncope.         Vitals:   12/08/20 0858 12/08/20 0859  BP: (!) 173/84 (!) 178/84  Pulse: 64 71  Resp: 16   Temp: 97.8 F (36.6 C)   SpO2: 98%      Body mass index is 31.53 kg/m. Filed Weights   12/08/20 0858  Weight: 178 lb (80.7 kg)     Objective:    Physical Exam Vitals and nursing note reviewed.  Constitutional:      General: She is not in acute distress. Neck:     Vascular: No JVD.  Cardiovascular:     Rate and Rhythm: Normal rate and regular rhythm.     Heart sounds: Normal heart sounds. No murmur heard. Pulmonary:     Effort: Pulmonary effort is normal.     Breath sounds: Normal breath sounds. No wheezing or rales.         Assessment & Recommendations:   70 y.o. African American female with hypertension, hyperlipidemia, type 2 DM  H/o abnormal stress test: While ischemia cannot be excluded, large part of stress test abnormality likely due to tissue attenuation. She is not having any chest pain, shortness of breath symptoms at this time. Continue current medical management for hypertension, hyperlipidemia.  Okay to resume aerobic activity. Monitor closely for  any symptoms. Added amlodipine 10 mg daily. Resume rosuvastatin-at 20 mg daily. Check labs-BMP, lipid panel, HbA1C in 1 month  Hypertension: As above  Hyperlipidemia: As above  F/u in 4-6 weeks  Caileen Veracruz Esther Hardy, MD [facility] Pager: [phone] Office: [phone] If no answer Cell [phone]

## 2021-01-15 ENCOUNTER — Ambulatory Visit: Payer: Medicare HMO | Admitting: Cardiology

## 2021-01-22 ENCOUNTER — Ambulatory Visit: Payer: Medicare HMO | Admitting: Cardiology

## 2021-01-22 NOTE — Progress Notes (Deleted)
Patient referred by [name],* for shortness of breath  Subjective:   Alice Hickman, female    DOB: 06-24-1950, 70 y.o.   MRN: [id number]  No chief complaint on file.   HPI  70 y.o. African American female with hypertension, hyperlipidemia, type 2 DM  Patient is now retired, does not do any regular physical activity, She has recently joined a gym, but has not started exercise yet. Blood pressure is uncontrolled. She is currently not taking statin.   Office note 12/2019: Patient was in [facility] emergency department on 07/01/2018 with complaints of exertional dyspnea and pain across her shoulders. Work-up showed EKG with nonspecific T wave inversions.  Troponin was mildly elevated and flat at 0.03.  D-dimer was negative.  Chest x-ray showed cardiomegaly without vascular congestion. At baseline, patient walks 30 minutes on treadmill at low speed, without any chest pain or shortness of breath. She is back to working at [employer] and has not had any chest pain or shortness of breath symptoms.  Her stress test showed abnormalities, as detailed below. However, patient had not had any chest pain, shortness of breath symptoms since the episode in 06/2018. We mutually agreed on holding off nay invasive workup at that time.  Patient is here for 1 year f/u. She is asymptomatic without any chest pain or shortness of breath, with her level of activity. She recently had a foot injury, and is recovering from it.  She has not had any labs recently.   Blood pressure is well controlled.   Current Outpatient Medications on File Prior to Visit  Medication Sig Dispense Refill   amLODipine (NORVASC) 5 MG tablet Take 1 tablet (5 mg total) by mouth daily. 180 tablet 3   aspirin 81 MG chewable tablet Chew by mouth every other day.     ergocalciferol (VITAMIN D2) 1.25 MG (50000 UT) capsule Vitamin D2 1,250 mcg (50,000 unit) capsule     fish oil-omega-3 fatty acids 1000 MG capsule Take 1 g by  mouth 2 (two) times a day.      furosemide (LASIX) 20 MG tablet Take 20 mg by mouth daily.     glimepiride (AMARYL) 2 MG tablet TAKE ONE TABLET BY MOUTH ONCE DAILY BEFORE BREAKFAST (Patient taking differently: Take 2 mg by mouth daily with breakfast.) 90 tablet 0   ibuprofen (ADVIL) 600 MG tablet Take 1 tablet (600 mg total) by mouth every 6 (six) hours as needed. 30 tablet 0   nitroGLYCERIN (NITROSTAT) 0.4 MG SL tablet Place 1 tablet (0.4 mg total) under the tongue every 5 (five) minutes as needed for chest pain. 90 tablet 3   olmesartan-hydrochlorothiazide (BENICAR HCT) 40-25 MG tablet TAKE ONE TABLET BY MOUTH ONCE DAILY. (Patient taking differently: Take 1 tablet by mouth daily.) 30 tablet 5   rosuvastatin (CRESTOR) 20 MG tablet Take 0.5 tablets (10 mg total) by mouth daily. 30 tablet 2   traZODone (DESYREL) 100 MG tablet as needed.     No current facility-administered medications on file prior to visit.    Cardiovascular studies:  EKG 12/08/2020: Sinus rhythm 60 bpm Occasional ectopic ventricular beat    Poor R-wave progression Nonspecific T-abnormality  Echocardiogram 09/04/2018: Normal LV systolic function with EF 55%. Left ventricle cavity is normal in size. Mild concentric hypertrophy of the left ventricle. Normal global wall motion. Doppler evidence of grade I (impaired) diastolic dysfunction, normal LAP. Calculated EF 55%. Left atrial cavity is mildly dilated. Right atrial cavity is mildly dilated. Moderate (  Grade II) mitral regurgitation. Mild tricuspid regurgitation.  No evidence of pulmonary hypertension.  Lexiscan Myoview stress test 08/16/2018:  Lexiscan stress test was performed. Stress EKG is non-diagnostic, as this is pharmacological stress test. SPECT images reveal large area of moderate decrease in tracer uptake in mid to basal inferior, inferoseptal and inferolateral myocardium, more prominent on stress than rest images. While breast tissue attenuation could be  responsible, ischemia in this area cannot be excluded.  Intermediate risk study.   Recent labs: 12/14/2019: Glucose 87, BUN/Cr 25/1.23. EGFR 52. Na/K 137/4.1.  HbA1C 6.4% Chol 169, TG 60, HDL 56, LDL 101 TSH N/A  06/21/2018: Glucose 130, BUN/Cr 11/0.83. EGFR >60. Na/K 139/3.5. Rest of the CMP normal H/H 12/38. MCV 89. Platelets 214 Chol 187, TG 78, HDL 51, LDL 120 BNP 149  HbA1C 6.6% TSH 1.2 normal   Review of Systems  Cardiovascular:  Negative for chest pain, dyspnea on exertion, leg swelling, palpitations and syncope.         There were no vitals filed for this visit.    There is no height or weight on file to calculate BMI. There were no vitals filed for this visit.    Objective:    Physical Exam Vitals and nursing note reviewed.  Constitutional:      General: She is not in acute distress. Neck:     Vascular: No JVD.  Cardiovascular:     Rate and Rhythm: Normal rate and regular rhythm.     Heart sounds: Normal heart sounds. No murmur heard. Pulmonary:     Effort: Pulmonary effort is normal.     Breath sounds: Normal breath sounds. No wheezing or rales.         Assessment & Recommendations:   70 y.o. African American female with hypertension, hyperlipidemia, type 2 DM  H/o abnormal stress test: While ischemia cannot be excluded, large part of stress test abnormality likely due to tissue attenuation. She is not having any chest pain, shortness of breath symptoms at this time. Continue current medical management for hypertension, hyperlipidemia.  Okay to resume aerobic activity. Monitor closely for any symptoms. Added amlodipine 10 mg daily. Resume rosuvastatin-at 20 mg daily. Check labs-BMP, lipid panel, HbA1C in 1 month  Hypertension: As above  Hyperlipidemia: As above  F/u in 4-6 weeks  Breslin Hemann Esther Hardy, MD [facility] Pager: [phone] Office: [phone] If no answer Cell [phone]

## 2021-02-10 ENCOUNTER — Telehealth: Payer: Self-pay

## 2021-02-11 ENCOUNTER — Other Ambulatory Visit: Payer: Self-pay

## 2021-02-11 ENCOUNTER — Ambulatory Visit: Payer: Medicare HMO | Admitting: Cardiology

## 2021-02-11 ENCOUNTER — Encounter: Payer: Self-pay | Admitting: Cardiology

## 2021-02-11 VITALS — BP 133/86 | HR 85 | Temp 98.0°F | Resp 16 | Ht 63.0 in | Wt 184.0 lb

## 2021-02-11 DIAGNOSIS — I10 Essential (primary) hypertension: Secondary | ICD-10-CM

## 2021-02-11 DIAGNOSIS — E782 Mixed hyperlipidemia: Secondary | ICD-10-CM

## 2021-02-11 NOTE — Progress Notes (Signed)
Patient referred by [name],* for shortness of breath  Subjective:   Alice Hickman, female    DOB: 1951-01-03, 71 y.o.   MRN: [id number]  Chief Complaint  Patient presents with   Hypertension   Hyperlipidemia   Follow-up    HPI  71 y.o. 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  She denies any chest pain, shortness of breath.  Blood pressure is well controlled.  Current Outpatient Medications on File Prior to Visit  Medication Sig Dispense Refill   amLODipine (NORVASC) 5 MG tablet Take 1 tablet (5 mg total) by mouth daily. 180 tablet 3   aspirin 81 MG chewable tablet Chew by mouth every other day.     ergocalciferol (VITAMIN D2) 1.25 MG (50000 UT) capsule Vitamin D2 1,250 mcg (50,000 unit) capsule     fish oil-omega-3 fatty acids 1000 MG capsule Take 1 g by mouth 2 (two) times a day.      furosemide (LASIX) 20 MG tablet Take 20 mg by mouth daily.     glimepiride (AMARYL) 2 MG tablet TAKE ONE TABLET BY MOUTH ONCE DAILY BEFORE BREAKFAST (Patient taking differently: Take 2 mg by mouth daily with breakfast.) 90 tablet 0   ibuprofen (ADVIL) 600 MG tablet Take 1 tablet (600 mg total) by mouth every 6 (six) hours as needed. 30 tablet 0   nitroGLYCERIN (NITROSTAT) 0.4 MG SL tablet Place 1 tablet (0.4 mg total) under the tongue every 5 (five) minutes as needed for chest pain. 90 tablet 3   olmesartan-hydrochlorothiazide (BENICAR HCT) 40-25 MG tablet TAKE ONE TABLET BY MOUTH ONCE DAILY. (Patient taking differently: Take 1 tablet by mouth daily.) 30 tablet 5   rosuvastatin (CRESTOR) 20 MG tablet Take 0.5 tablets (10 mg total) by mouth daily. 30 tablet 2   traZODone (DESYREL) 100 MG tablet as needed.     No current facility-administered medications on file prior to visit.    Cardiovascular studies:  EKG 12/08/2020: Sinus rhythm 60  bpm Occasional ectopic ventricular beat    Poor R-wave progression Nonspecific T-abnormality  Echocardiogram 09/04/2018: Normal LV systolic function with EF 55%. Left ventricle cavity is normal in size. Mild concentric hypertrophy of the left ventricle. Normal global wall motion. Doppler evidence of grade I (impaired) diastolic dysfunction, normal LAP. Calculated EF 55%. Left atrial cavity is mildly dilated. Right atrial cavity is mildly dilated. Moderate (Grade II) mitral regurgitation. Mild tricuspid regurgitation.  No evidence of pulmonary hypertension.  Lexiscan Myoview stress test 08/16/2018:  Lexiscan stress test was performed. Stress EKG is non-diagnostic, as this is pharmacological stress test. SPECT images reveal large area of moderate decrease in tracer uptake in mid to basal inferior, inferoseptal and inferolateral myocardium, more prominent on stress than rest images. While breast tissue attenuation could be responsible, ischemia in this area cannot be excluded.  Intermediate risk study.   Recent labs: 12/14/2019: Glucose 87, BUN/Cr 25/1.23. EGFR 52. Na/K 137/4.1.  HbA1C 6.4% Chol 169, TG 60, HDL 56, LDL 101 TSH N/A  06/21/2018: Glucose 130, BUN/Cr 11/0.83. EGFR >60. Na/K 139/3.5. Rest of the CMP normal H/H 12/38. MCV 89. Platelets 214 Chol 187, TG 78, HDL 51, LDL 120 BNP 149  HbA1C 6.6% TSH 1.2 normal   Review of Systems  Cardiovascular:  Negative for chest pain, dyspnea on exertion, leg swelling, palpitations and syncope.  ° ° °Patient referred by Starkes-Perry, Takia S,* for shortness of breath ° °Subjective:  ° °Alice Hickman, female    DOB: 09/22/1950, 70 y.o.   MRN: [id number] ° °Chief Complaint  °Patient presents with  ° Hypertension  ° Hyperlipidemia  ° Follow-up  ° ° °HPI ° °70 y.o. 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  She denies any chest pain, shortness of breath.  Blood pressure is well controlled. ° °Current Outpatient Medications on File Prior to Visit  °Medication Sig Dispense Refill  ° amLODipine (NORVASC) 5 MG tablet Take 1 tablet (5 mg total) by mouth daily. 180 tablet 3  ° aspirin 81 MG chewable tablet Chew by mouth every other day.    ° ergocalciferol (VITAMIN D2) 1.25 MG (50000 UT) capsule Vitamin D2 1,250 mcg (50,000 unit) capsule    ° fish oil-omega-3 fatty acids 1000 MG capsule Take 1 g by mouth 2 (two) times a day.     ° furosemide (LASIX) 20 MG tablet Take 20 mg by mouth daily.    ° glimepiride (AMARYL) 2 MG tablet TAKE ONE TABLET BY MOUTH ONCE DAILY BEFORE BREAKFAST (Patient taking differently: Take 2 mg by mouth daily with breakfast.) 90 tablet 0  ° ibuprofen (ADVIL) 600 MG tablet Take 1 tablet (600 mg total) by mouth every 6 (six) hours as needed. 30 tablet 0  ° nitroGLYCERIN (NITROSTAT) 0.4 MG SL tablet Place 1 tablet (0.4 mg total) under the tongue every 5 (five) minutes as needed for chest pain. 90 tablet 3  ° olmesartan-hydrochlorothiazide (BENICAR HCT) 40-25 MG tablet TAKE ONE TABLET BY MOUTH ONCE DAILY. (Patient taking differently: Take 1 tablet by mouth daily.) 30 tablet 5  ° rosuvastatin (CRESTOR) 20 MG tablet Take 0.5 tablets (10 mg total) by mouth daily. 30 tablet 2  ° traZODone (DESYREL) 100 MG tablet as needed.    ° °No current facility-administered medications on file prior to visit.  ° ° °Cardiovascular studies: ° °EKG 12/08/2020: °Sinus rhythm 60  bpm °Occasional ectopic ventricular beat    °Poor R-wave progression °Nonspecific T-abnormality ° °Echocardiogram 09/04/2018: °Normal LV systolic function with EF 55%. Left ventricle cavity is normal in size. Mild concentric hypertrophy of the left ventricle. Normal global wall motion. Doppler evidence of grade I (impaired) diastolic dysfunction, normal LAP. Calculated EF 55%. °Left atrial cavity is mildly dilated. °Right atrial cavity is mildly dilated. °Moderate (Grade II) mitral regurgitation. °Mild tricuspid regurgitation.  °No evidence of pulmonary hypertension. ° °Lexiscan Myoview stress test 08/16/2018:  °Lexiscan stress test was performed. Stress EKG is non-diagnostic, as this is pharmacological stress test. °SPECT images reveal large area of moderate decrease in tracer uptake in mid to basal inferior, inferoseptal and inferolateral myocardium, more prominent on stress than rest images. While breast tissue attenuation could be responsible, ischemia in this area cannot be excluded.  °Intermediate risk study.  ° °Recent labs: °12/14/2019: °Glucose 87, BUN/Cr 25/1.23. EGFR 52. Na/K 137/4.1.  °HbA1C 6.4% °Chol 169, TG 60, HDL 56, LDL 101 °TSH N/A ° °06/21/2018: °Glucose 130, BUN/Cr 11/0.83. EGFR >60. Na/K 139/3.5. Rest of the CMP normal °H/H 12/38. MCV 89. Platelets 214 °Chol 187, TG 78, HDL 51, LDL 120 °BNP 149 ° °HbA1C 6.6% °TSH 1.2 normal ° ° °Review of Systems  °Cardiovascular:  Negative for chest pain, dyspnea on exertion, leg swelling, palpitations and syncope.  ° ° °   ° ° °

## 2021-05-05 NOTE — Telephone Encounter (Signed)
Daughter called about refilling a medication. ?

## 2021-07-10 ENCOUNTER — Ambulatory Visit (HOSPITAL_COMMUNITY)
Admission: EM | Admit: 2021-07-10 | Discharge: 2021-07-10 | Disposition: A | Discharge status: Home / Self Care | Payer: Medicare HMO | Attending: Emergency Medicine | Admitting: Emergency Medicine

## 2021-07-10 ENCOUNTER — Encounter (HOSPITAL_COMMUNITY): Payer: Self-pay | Admitting: Emergency Medicine

## 2021-07-10 DIAGNOSIS — M79672 Pain in left foot: Secondary | ICD-10-CM | POA: Diagnosis not present

## 2021-07-10 MED ORDER — MELOXICAM 7.5 MG PO TABS: 7.5000 mg | ORAL_TABLET | Freq: Every day | ORAL | 0 refills | Status: DC

## 2021-07-10 MED ORDER — CYCLOBENZAPRINE HCL 5 MG PO TABS: 5.0000 mg | ORAL_TABLET | Freq: Every day | ORAL | 0 refills | Status: DC

## 2021-07-10 NOTE — ED Triage Notes (Signed)
Pt c/o left foot pain and swelling for about 3-4 days. Tried soaking without relief. Unsure if gout flare.

## 2021-07-10 NOTE — Discharge Instructions (Signed)
Today I believe that your symptoms are related to an inflammatory process such as gout or arthritis, as there was no injury not needing x-ray as your bone is most likely intact, and we will work to reduce the swelling which in turn should help reduce your pain  Begin use of meloxicam every morning with a small amount of food for 5 days then may use as needed, throughout the day you may use Tylenol 500 to 1000 mg every 6 hours in addition  You may use muscle relaxer at bedtime for additional support, be mindful this medicine may make you drowsy  You may use ice or heat over the affected area in 10 to 15-minute intervals to help to reduce swelling  You may elevate your foot on a pillow while sitting or lying  You may wrap the foot with Ace bandage or use compression hose which will help to reduce swelling and pain  If symptoms continue to persist you may follow-up with this urgent care or your primary doctor for reevaluation as needed

## 2021-07-10 NOTE — ED Provider Notes (Signed)
[facility]    CSN: [id number] Arrival date & time: 07/10/21  1243      History   Chief Complaint Chief Complaint  Patient presents with   Foot Pain    HPI Alice Hickman is a 71 y.o. female.   Patient presents with left foot and ankle swelling for 4 days.  Symptoms started abruptly without precipitating event, injury or trauma.  Able to bear weight.  Range of motion intact but elicits pain with rotation of the ankles.  This has attempted use ibuprofen which has been minimally helpful.  History of gout.   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 Type II 11/15/2006   Generalized osteoarthritis 10/27/2012   bilat ankles, knees, hands and wrists   GERD 11/15/2006   GLAUCOMA 07/04/2008   Gout 11/14/2014   HYPERLIPIDEMIA 05/01/2007   HYPERTENSION 11/15/2006   HYPOKALEMIA 10/02/2008   Low back pain    OSTEOPENIA 09/02/2009   Peripheral neuropathy 11/14/2014   THUMB PAIN, RIGHT 04/02/2009    Patient Active Problem List   Diagnosis Date Noted   Prediabetes 12/07/2019   Abnormal stress test 09/14/2018   Abnormal EKG 07/27/2018   SOB (shortness of breath) 07/06/2018   Cardiomegaly 07/06/2018   Peripheral neuropathy 11/14/2014   Gout 11/14/2014   Right foot pain 07/18/2014   Cellulitis of left foot 07/18/2014   Hypokalemia 05/08/2014   Pes planus of both feet 05/01/2013   Osteoarthritis of both knees 12/01/2012   Swelling of both ankles 10/31/2012   Generalized osteoarthritis 10/27/2012   Recurrent falls 09/11/2012   Screening cholesterol level 05/12/2010   ANXIETY, SITUATIONAL 09/02/2009   OSTEOPENIA 09/02/2009   GLAUCOMA 07/04/2008   Hyperlipidemia 05/01/2007   ALLERGIC RHINITIS 05/01/2007   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      Home Medications    Prior to Admission medications   Medication Sig Start Date End Date Taking? Authorizing Provider  amLODipine (NORVASC) 5 MG tablet Take 1 tablet (5 mg total) by mouth daily. 12/08/20 03/08/21  Elder NegusPatwardhan, Manish J, MD  aspirin 81 MG chewable tablet Chew by mouth every other day.    [provider]  ergocalciferol (VITAMIN D2) 1.25 MG (50000 UT) capsule Vitamin D2 1,250 mcg (50,000 unit) capsule    [provider]  fish oil-omega-3 fatty acids 1000 MG capsule Take 1 g by mouth 2 (two) times a day.     [provider]  furosemide (LASIX) 20 MG tablet Take 20 mg by mouth daily. 07/11/18   [provider]  glimepiride (AMARYL) 2 MG tablet TAKE ONE TABLET BY MOUTH ONCE DAILY BEFORE BREAKFAST Patient taking differently: Take 2 mg by mouth daily with breakfast. 11/06/15   [name], [name], MD  ibuprofen (ADVIL) 600 MG tablet Take 1 tablet (600 mg total) by mouth every 6 (six) hours as needed. 09/10/20   Ivette LoyalSmith, Fowler R, NP  nitroGLYCERIN (NITROSTAT) 0.4 MG SL tablet Place 1 tablet (0.4 mg total) under the tongue every 5 (five) minutes as needed for  chest pain. 09/15/18 02/11/21  Patwardhan, Anabel Bene, MD  olmesartan-hydrochlorothiazide (BENICAR HCT) 40-25 MG tablet TAKE ONE TABLET BY MOUTH ONCE DAILY. Patient taking differently: Take 1 tablet by mouth daily. 11/06/15   Corwin Levins, MD  rosuvastatin (CRESTOR) 20 MG tablet Take 0.5 tablets (10 mg total) by mouth daily. 12/08/20   Patwardhan, Anabel Bene, MD  traZODone (DESYREL) 100 MG tablet as needed. 07/11/18   [provider]    Family History Family History  Problem Relation Age of Onset   Heart disease Mother    Hypertension Mother    Cancer Brother        throat cancer, smoker   Cancer Brother     Breast cancer Granddaughter    Heart disease Other    Hypertension Other    Cancer Other        lung cancer   Colon cancer Neg Hx    Colon polyps Neg Hx    Esophageal cancer Neg Hx    Stomach cancer Neg Hx    Rectal cancer Neg Hx     Social History Social History   Tobacco Use   Smoking status: Never   Smokeless tobacco: Never  Vaping Use   Vaping Use: Never used  Substance Use Topics   Alcohol use: No   Drug use: No     Allergies   Lovastatin, Metformin, and Penicillins   Review of Systems Review of Systems  Constitutional: Negative.   Respiratory: Negative.    Cardiovascular: Negative.   Musculoskeletal:  Positive for joint swelling. Negative for arthralgias, back pain, gait problem, myalgias, neck pain and neck stiffness.  Skin: Negative.   Neurological: Negative.      Physical Exam Triage Vital Signs ED Triage Vitals  Enc Vitals Group     BP 07/10/21 1305 121/65     Pulse Rate 07/10/21 1305 67     Resp 07/10/21 1305 20     Temp 07/10/21 1305 98.3 F (36.8 C)     Temp Source 07/10/21 1305 Oral     SpO2 07/10/21 1305 98 %     Weight --      Height --      Head Circumference --      Peak Flow --      Pain Score 07/10/21 1304 8     Pain Loc --      Pain Edu? --      Excl. in GC? --    No data found.  Updated Vital Signs BP 121/65 (BP Location: Right Arm)   Pulse 67   Temp 98.3 F (36.8 C) (Oral)   Resp 20   SpO2 98%   Visual Acuity Right Eye Distance:   Left Eye Distance:   Bilateral Distance:    Right Eye Near:   Left Eye Near:    Bilateral Near:     Physical Exam Constitutional:      Appearance: Normal appearance.  HENT:     Head: Normocephalic.  Eyes:     Extraocular Movements: Extraocular movements intact.  Pulmonary:     Effort: Pulmonary effort is normal.  Musculoskeletal:     Comments: Mild to moderate swelling and tenderness present to the lateral aspect of the left ankle and the dorsum of the midfoot, ecchymosis or  deformity noted, able to bear weight.  Limited range of motion of ankle, unable to complete rotation, 2+ dorsalis pedis pulse, sensation intact, capillary refill less than 3  Skin:    General: Skin  is warm and dry.  Neurological:     Mental Status: She is alert and oriented to person, place, and time. Mental status is at baseline.  Psychiatric:        Mood and Affect: Mood normal.        Behavior: Behavior normal.      UC Treatments / Results  Labs (all labs ordered are listed, but only abnormal results are displayed) Labs Reviewed - No data to display  EKG   Radiology No results found.  Procedures Procedures (including critical care time)  Medications Ordered in UC Medications - No data to display  Initial Impression / Assessment and Plan / UC Course  I have reviewed the triage vital signs and the nursing notes.  Pertinent labs & imaging results that were available during my care of the patient were reviewed by me and considered in my medical decision making (see chart for details).  Acute left foot pain  Etiology is most likely inflammatory rather gout or arthritis, discussed with patient, will defer imaging today due to lack of injury, low suspicion for bone involvement, patient has had similar symptoms occur in the past typically occurring yearly, has had success with NSAIDs and muscle relaxant in the past, prescribed meloxicam and Flexeril For outpatient use, recommended RICE, heat and activity as tolerated, may follow-up with PCP if symptoms persist Final Clinical Impressions(s) / UC Diagnoses   Final diagnoses:  None   Discharge Instructions   None    ED Prescriptions   None    PDMP not reviewed this encounter.   Valinda Hoar, NP 07/10/21 1340

## 2021-08-12 ENCOUNTER — Ambulatory Visit: Payer: Medicare HMO | Admitting: Cardiology

## 2021-08-12 ENCOUNTER — Encounter: Payer: Self-pay | Admitting: Cardiology

## 2021-08-12 VITALS — BP 123/79 | HR 72 | Temp 97.9°F | Resp 17 | Ht 63.0 in | Wt 182.0 lb

## 2021-08-12 DIAGNOSIS — I10 Essential (primary) hypertension: Secondary | ICD-10-CM

## 2021-08-12 DIAGNOSIS — R9439 Abnormal result of other cardiovascular function study: Secondary | ICD-10-CM

## 2021-08-12 DIAGNOSIS — E782 Mixed hyperlipidemia: Secondary | ICD-10-CM

## 2021-08-12 MED ORDER — FUROSEMIDE 20 MG PO TABS: 20.0000 mg | ORAL_TABLET | ORAL | 2 refills | Status: DC | PRN

## 2021-08-12 NOTE — Progress Notes (Signed)
Patient referred by [name],* for shortness of breath  Subjective:   Alice Hickman, female    DOB: 1950-02-22, 71 y.o.   MRN: [id number]  No chief complaint on file.   HPI  71 y.o. African American female with hypertension, hyperlipidemia, type 2 DM  Patient is here with her daughter today.  She is doing well, is exercising regularly. She denies chest pain, shortness of breath, palpitations, leg edema, orthopnea, PND, TIA/syncope.    Current Outpatient Medications:    amLODipine (NORVASC) 5 MG tablet, Take 1 tablet (5 mg total) by mouth daily., Disp: 180 tablet, Rfl: 3   aspirin 81 MG chewable tablet, Chew by mouth every other day., Disp: , Rfl:    cyclobenzaprine (FLEXERIL) 5 MG tablet, Take 1 tablet (5 mg total) by mouth at bedtime., Disp: 30 tablet, Rfl: 0   ergocalciferol (VITAMIN D2) 1.25 MG (50000 UT) capsule, Vitamin D2 1,250 mcg (50,000 unit) capsule, Disp: , Rfl:    fish oil-omega-3 fatty acids 1000 MG capsule, Take 1 g by mouth 2 (two) times a day. , Disp: , Rfl:    furosemide (LASIX) 20 MG tablet, Take 20 mg by mouth daily., Disp: , Rfl:    glimepiride (AMARYL) 2 MG tablet, TAKE ONE TABLET BY MOUTH ONCE DAILY BEFORE BREAKFAST (Patient taking differently: Take 2 mg by mouth daily with breakfast.), Disp: 90 tablet, Rfl: 0   ibuprofen (ADVIL) 600 MG tablet, Take 1 tablet (600 mg total) by mouth every 6 (six) hours as needed., Disp: 30 tablet, Rfl: 0   meloxicam (MOBIC) 7.5 MG tablet, Take 1 tablet (7.5 mg total) by mouth daily., Disp: 30 tablet, Rfl: 0   nitroGLYCERIN (NITROSTAT) 0.4 MG SL tablet, Place 1 tablet (0.4 mg total) under the tongue every 5 (five) minutes as needed for chest pain., Disp: 90 tablet, Rfl: 3   olmesartan-hydrochlorothiazide (BENICAR HCT) 40-25 MG tablet, TAKE ONE TABLET BY MOUTH ONCE DAILY. (Patient taking differently: Take 1 tablet by mouth daily.), Disp: 30 tablet, Rfl: 5   rosuvastatin (CRESTOR) 20 MG tablet, Take 0.5 tablets (10  mg total) by mouth daily., Disp: 30 tablet, Rfl: 2   traZODone (DESYREL) 100 MG tablet, as needed., Disp: , Rfl:     Cardiovascular studies:  EKG 08/12/2021: Sinus rhythm 71 bpm Frequent ectopic ventricular beats  Poor R-wave progression  Nonspecific T-abnormality   Echocardiogram 09/04/2018: Normal LV systolic function with EF 55%. Left ventricle cavity is normal in size. Mild concentric hypertrophy of the left ventricle. Normal global wall motion. Doppler evidence of grade I (impaired) diastolic dysfunction, normal LAP. Calculated EF 55%. Left atrial cavity is mildly dilated. Right atrial cavity is mildly dilated. Moderate (Grade II) mitral regurgitation. Mild tricuspid regurgitation.  No evidence of pulmonary hypertension.  Lexiscan Myoview stress test 08/16/2018:  Lexiscan stress test was performed. Stress EKG is non-diagnostic, as this is pharmacological stress test. SPECT images reveal large area of moderate decrease in tracer uptake in mid to basal inferior, inferoseptal and inferolateral myocardium, more prominent on stress than rest images. While breast tissue attenuation could be responsible, ischemia in this area cannot be excluded.  Intermediate risk study.   Recent labs: 12/23/2020: Glucose 95, BUN/Cr 20/1.18. EGFR 50. Na/K 143/4.4. Rest of the CMP normal H/H 14/44. MCV 86. Platelets NA HbA1C 5.9% Chol 158, TG 94, HDL 70, LDL 71   Review of Systems  Cardiovascular:  Negative for chest pain, dyspnea on exertion, leg swelling, palpitations and syncope.  Vitals:   08/12/21 1003  BP: 123/79  Pulse: 72  Resp: 17  Temp: 97.9 F (36.6 C)  SpO2: 98%     Body mass index is 32.24 kg/m. Filed Weights   08/12/21 1003  Weight: 182 lb (82.6 kg)     Objective:    Physical Exam Vitals and nursing note reviewed.  Constitutional:      General: She is not in acute distress. Neck:     Vascular: No JVD.  Cardiovascular:     Rate and Rhythm: Normal  rate and regular rhythm.     Heart sounds: Normal heart sounds. No murmur heard. Pulmonary:     Effort: Pulmonary effort is normal.     Breath sounds: Normal breath sounds. No wheezing or rales.          Assessment & Recommendations:   71 y.o. African American female with hypertension, hyperlipidemia, type 2 DM  H/o abnormal stress test: While ischemia cannot be excluded, large part of stress test abnormality likely due to tissue attenuation. She is not having any chest pain, shortness of breath symptoms at this time. Continue current medical management for hypertension, hyperlipidemia.   Hypertension: Controlled  Hyperlipidemia: As above  F/u in 1 year  Nigel Mormon, MD [facility] Pager: [phone] Office: [phone] If no answer Cell [phone]

## 2021-08-24 DIAGNOSIS — Z1331 Encounter for screening for depression: Secondary | ICD-10-CM | POA: Diagnosis not present

## 2021-08-24 DIAGNOSIS — E119 Type 2 diabetes mellitus without complications: Secondary | ICD-10-CM | POA: Diagnosis not present

## 2021-08-24 DIAGNOSIS — J069 Acute upper respiratory infection, unspecified: Secondary | ICD-10-CM | POA: Diagnosis not present

## 2021-08-24 DIAGNOSIS — Z6833 Body mass index (BMI) 33.0-33.9, adult: Secondary | ICD-10-CM | POA: Diagnosis not present

## 2021-08-24 DIAGNOSIS — D509 Iron deficiency anemia, unspecified: Secondary | ICD-10-CM | POA: Diagnosis not present

## 2021-08-24 DIAGNOSIS — E1165 Type 2 diabetes mellitus with hyperglycemia: Secondary | ICD-10-CM | POA: Diagnosis not present

## 2021-08-24 DIAGNOSIS — E785 Hyperlipidemia, unspecified: Secondary | ICD-10-CM | POA: Diagnosis not present

## 2021-08-24 DIAGNOSIS — I10 Essential (primary) hypertension: Secondary | ICD-10-CM | POA: Diagnosis not present

## 2021-08-24 DIAGNOSIS — Z0000 Encounter for general adult medical examination without abnormal findings: Secondary | ICD-10-CM | POA: Diagnosis not present

## 2021-08-24 DIAGNOSIS — E538 Deficiency of other specified B group vitamins: Secondary | ICD-10-CM | POA: Diagnosis not present

## 2021-08-24 DIAGNOSIS — F0390 Unspecified dementia without behavioral disturbance: Secondary | ICD-10-CM | POA: Diagnosis not present

## 2021-08-31 DIAGNOSIS — E1122 Type 2 diabetes mellitus with diabetic chronic kidney disease: Secondary | ICD-10-CM | POA: Diagnosis not present

## 2021-08-31 DIAGNOSIS — N1831 Chronic kidney disease, stage 3a: Secondary | ICD-10-CM | POA: Diagnosis not present

## 2021-09-14 ENCOUNTER — Ambulatory Visit (HOSPITAL_COMMUNITY): Payer: Medicare HMO

## 2021-09-14 ENCOUNTER — Ambulatory Visit (HOSPITAL_COMMUNITY)
Admission: EM | Admit: 2021-09-14 | Discharge: 2021-09-14 | Disposition: A | Discharge status: Home / Self Care | Payer: Medicare HMO | Attending: Physician Assistant | Admitting: Physician Assistant

## 2021-09-14 ENCOUNTER — Encounter (HOSPITAL_COMMUNITY): Payer: Self-pay | Admitting: *Deleted

## 2021-09-14 DIAGNOSIS — J3489 Other specified disorders of nose and nasal sinuses: Secondary | ICD-10-CM

## 2021-09-14 DIAGNOSIS — R051 Acute cough: Secondary | ICD-10-CM

## 2021-09-14 DIAGNOSIS — J069 Acute upper respiratory infection, unspecified: Secondary | ICD-10-CM

## 2021-09-14 MED ORDER — DM-GUAIFENESIN ER 30-600 MG PO TB12: 1.0000 | ORAL_TABLET | Freq: Two times a day (BID) | ORAL | 0 refills | Status: DC

## 2021-09-14 MED ORDER — FLUTICASONE PROPIONATE 50 MCG/ACT NA SUSP: 2.0000 | NASAL | 0 refills | Status: DC | PRN

## 2021-09-14 NOTE — Discharge Instructions (Addendum)
Advised use of Flonase nasal spray 2 sprays each nostril once a day to help control the sinus drainage and congestion. Advised to use Mucinex DM 1 every 12 hours to help control the cough and chest congestion. Advised to follow-up with PCP or return to urgent care if symptoms fail to improve.

## 2021-09-14 NOTE — ED Triage Notes (Signed)
Patient complains of congestion X 2 weeks. Has not tried otc meds.

## 2021-09-14 NOTE — ED Provider Notes (Signed)
[facility]    CSN: [id number] Arrival date & time: 09/14/21  0845      History   Chief Complaint Chief Complaint  Patient presents with   Nasal Congestion    HPI Alice Hickman is a 71 y.o. female.   71 year old female presents with cough, congestion.  Patient indicates for the past 3 weeks she has been having some sinus congestion, intermittent mild sore throat, and chest congestion with intermittent cough.  Patient indicates that the production that she is bringing up is thick and slimy although clear to yellow.  She indicates that this started after she used some Vaseline that she rubbed on her nose and face to help reduce irritation while wearing a mask and working out.  Patient is concerned that she may have inhaled or swallowed some of the Vaseline as she was sweating.  She indicates she is not having any fever, chills, shortness of breath or wheezing.  Patient indicates she has not taken any medicines for her congestion or cough.  Does indicates she has used Flonase in the past for sinus congestion and this is worked well for her.  She indicates she has not been around any family or friends that have been sick over the past 1 to 2 weeks.     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 Type II 11/15/2006   Generalized osteoarthritis 10/27/2012   bilat ankles, knees, hands and wrists   GERD 11/15/2006   GLAUCOMA 07/04/2008   Gout 11/14/2014   HYPERLIPIDEMIA 05/01/2007   HYPERTENSION 11/15/2006   HYPOKALEMIA 10/02/2008   Low back pain    OSTEOPENIA 09/02/2009   Peripheral neuropathy 11/14/2014   THUMB PAIN, RIGHT 04/02/2009    Patient Active Problem List   Diagnosis Date Noted   Prediabetes 12/07/2019   Abnormal stress test 09/14/2018   Abnormal EKG 07/27/2018   SOB (shortness of breath) 07/06/2018    Cardiomegaly 07/06/2018   Peripheral neuropathy 11/14/2014   Gout 11/14/2014   Right foot pain 07/18/2014   Cellulitis of left foot 07/18/2014   Hypokalemia 05/08/2014   Pes planus of both feet 05/01/2013   Osteoarthritis of both knees 12/01/2012   Swelling of both ankles 10/31/2012   Generalized osteoarthritis 10/27/2012   Recurrent falls 09/11/2012   Screening cholesterol level 05/12/2010   ANXIETY, SITUATIONAL 09/02/2009   OSTEOPENIA 09/02/2009   GLAUCOMA 07/04/2008   Mixed hyperlipidemia 05/01/2007   ALLERGIC RHINITIS 05/01/2007   Diabetes mellitus with neuropathy (HCC) 11/15/2006   CARPAL TUNNEL SYNDROME, RIGHT 11/15/2006   Essential hypertension 11/15/2006   GERD 11/15/2006    Past Surgical History:  Procedure Laterality Date   right shoulder sugury     TONSILLECTOMY      OB History   No obstetric history on file.      Home Medications    Prior to Admission medications   Medication Sig Start Date End Date Taking? Authorizing Provider  aspirin 81 MG chewable tablet Chew by mouth every other day.   Yes [provider]  cyclobenzaprine (FLEXERIL) 5 MG tablet Take 1 tablet (5 mg total) by mouth at bedtime. 07/10/21  Yes White, Adrienne R, NP  dextromethorphan-guaiFENesin (MUCINEX DM) 30-600 MG 12hr tablet Take 1 tablet by mouth 2 (two) times daily. 09/14/21  Yes Ellsworth Lennox, PA-C  ergocalciferol (VITAMIN D2)  1.25 MG (50000 UT) capsule Vitamin D2 1,250 mcg (50,000 unit) capsule   Yes [provider]  fish oil-omega-3 fatty acids 1000 MG capsule Take 1 g by mouth 2 (two) times a day.    Yes [provider]  furosemide (LASIX) 20 MG tablet Take 1 tablet (20 mg total) by mouth as needed. 08/12/21  Yes Patwardhan, Manish J, MD  glimepiride (AMARYL) 2 MG tablet TAKE ONE TABLET BY MOUTH ONCE DAILY BEFORE BREAKFAST Patient taking differently: Take 2 mg by mouth daily with breakfast. 11/06/15  Yes Corwin Levins, MD  ibuprofen (ADVIL) 600 MG tablet Take 1  tablet (600 mg total) by mouth every 6 (six) hours as needed. 09/10/20  Yes Ivette Loyal, NP  meloxicam (MOBIC) 7.5 MG tablet Take 1 tablet (7.5 mg total) by mouth daily. 07/10/21  Yes White, Adrienne R, NP  olmesartan-hydrochlorothiazide (BENICAR HCT) 40-25 MG tablet TAKE ONE TABLET BY MOUTH ONCE DAILY. Patient taking differently: Take 1 tablet by mouth daily. 11/06/15  Yes Corwin Levins, MD  rosuvastatin (CRESTOR) 20 MG tablet Take 0.5 tablets (10 mg total) by mouth daily. 12/08/20  Yes Patwardhan, Anabel Bene, MD  traZODone (DESYREL) 100 MG tablet as needed. 07/11/18  Yes [provider]  amLODipine (NORVASC) 5 MG tablet Take 1 tablet (5 mg total) by mouth daily. 12/08/20 08/12/21  Patwardhan, Anabel Bene, MD  fluticasone (FLONASE) 50 MCG/ACT nasal spray Place 2 sprays into both nostrils as needed. 09/14/21   Ellsworth Lennox, PA-C  nitroGLYCERIN (NITROSTAT) 0.4 MG SL tablet Place 1 tablet (0.4 mg total) under the tongue every 5 (five) minutes as needed for chest pain. 09/15/18 08/12/21  Patwardhan, Anabel Bene, MD    Family History Family History  Problem Relation Age of Onset   Heart disease Mother    Hypertension Mother    Cancer Brother        throat cancer, smoker   Cancer Brother    Breast cancer Granddaughter    Heart disease Other    Hypertension Other    Cancer Other        lung cancer   Colon cancer Neg Hx    Colon polyps Neg Hx    Esophageal cancer Neg Hx    Stomach cancer Neg Hx    Rectal cancer Neg Hx     Social History Social History   Tobacco Use   Smoking status: Never   Smokeless tobacco: Never  Vaping Use   Vaping Use: Never used  Substance Use Topics   Alcohol use: No   Drug use: No     Allergies   Lovastatin, Metformin, and Penicillins   Review of Systems Review of Systems  HENT:  Positive for postnasal drip, sinus pressure and sore throat.   Respiratory:  Positive for cough.      Physical Exam Triage Vital Signs ED Triage Vitals  Enc Vitals Group      BP 09/14/21 0857 112/64     Pulse Rate 09/14/21 0857 68     Resp 09/14/21 0857 18     Temp 09/14/21 0857 98.1 F (36.7 C)     Temp Source 09/14/21 0857 Oral     SpO2 09/14/21 0857 99 %     Weight --      Height --      Head Circumference --      Peak Flow --      Pain Score 09/14/21 0856 0     Pain Loc --  Pain Edu? --      Excl. in GC? --    No data found.  Updated Vital Signs BP 112/64 (BP Location: Left Arm)   Pulse 68   Temp 98.1 F (36.7 C) (Oral)   Resp 18   SpO2 99%   Visual Acuity Right Eye Distance:   Left Eye Distance:   Bilateral Distance:    Right Eye Near:   Left Eye Near:    Bilateral Near:     Physical Exam Constitutional:      Appearance: Normal appearance.  HENT:     Right Ear: Ear canal normal. Tympanic membrane is injected.     Left Ear: Ear canal normal. Tympanic membrane is injected.  Cardiovascular:     Rate and Rhythm: Normal rate and regular rhythm.     Heart sounds: Normal heart sounds.  Pulmonary:     Effort: Pulmonary effort is normal.     Breath sounds: Normal breath sounds and air entry. No wheezing, rhonchi or rales.  Lymphadenopathy:     Cervical: No cervical adenopathy.  Neurological:     Mental Status: She is alert.      UC Treatments / Results  Labs (all labs ordered are listed, but only abnormal results are displayed) Labs Reviewed - No data to display  EKG   Radiology No results found.  Procedures Procedures (including critical care time)  Medications Ordered in UC Medications - No data to display  Initial Impression / Assessment and Plan / UC Course  I have reviewed the triage vital signs and the nursing notes.  Pertinent labs & imaging results that were available during my care of the patient were reviewed by me and considered in my medical decision making (see chart for details).    Plan: 1.  Advised to take Flonase 2 sprays each nostril once a day to control the sinus congestion. 2.   Advised take Mucinex DM 1 every 12 hours to help control the cough and chest congestion. 3.  Advised to follow-up with PCP or return to urgent care if symptoms fail to improve. Final Clinical Impressions(s) / UC Diagnoses   Final diagnoses:  Viral upper respiratory tract infection  Acute cough  Sinus drainage     Discharge Instructions      Advised use of Flonase nasal spray 2 sprays each nostril once a day to help control the sinus drainage and congestion. Advised to use Mucinex DM 1 every 12 hours to help control the cough and chest congestion. Advised to follow-up with PCP or return to urgent care if symptoms fail to improve.    ED Prescriptions     Medication Sig Dispense Auth. Provider   fluticasone (FLONASE) 50 MCG/ACT nasal spray Place 2 sprays into both nostrils as needed. 11.1 mL Ellsworth Lennox, PA-C   dextromethorphan-guaiFENesin [facility] DM) 30-600 MG 12hr tablet Take 1 tablet by mouth 2 (two) times daily. 20 tablet Ellsworth Lennox, PA-C      PDMP not reviewed this encounter.   Ellsworth Lennox, PA-C 09/14/21 1006

## 2022-02-24 ENCOUNTER — Encounter (HOSPITAL_COMMUNITY): Payer: Self-pay | Admitting: Emergency Medicine

## 2022-02-24 ENCOUNTER — Ambulatory Visit (HOSPITAL_COMMUNITY)
Admission: EM | Admit: 2022-02-24 | Discharge: 2022-02-24 | Disposition: A | Discharge status: Home / Self Care | Payer: Medicare HMO | Attending: Internal Medicine | Admitting: Internal Medicine

## 2022-02-24 ENCOUNTER — Ambulatory Visit (INDEPENDENT_AMBULATORY_CARE_PROVIDER_SITE_OTHER): Payer: Medicare HMO

## 2022-02-24 DIAGNOSIS — M7989 Other specified soft tissue disorders: Secondary | ICD-10-CM | POA: Diagnosis not present

## 2022-02-24 DIAGNOSIS — M109 Gout, unspecified: Secondary | ICD-10-CM

## 2022-02-24 DIAGNOSIS — M79644 Pain in right finger(s): Secondary | ICD-10-CM

## 2022-02-24 DIAGNOSIS — M19041 Primary osteoarthritis, right hand: Secondary | ICD-10-CM | POA: Diagnosis not present

## 2022-02-24 DIAGNOSIS — M1A0411 Idiopathic chronic gout, right hand, with tophus (tophi): Secondary | ICD-10-CM

## 2022-02-24 MED ORDER — PREDNISONE 10 MG (21) PO TBPK: ORAL_TABLET | Freq: Every day | ORAL | 0 refills | Status: DC

## 2022-02-24 NOTE — ED Triage Notes (Signed)
Pt had for 2 weeks swelling area on right 4th finger and blister came up but didn't try to pop area. Pt reports been soaking finger.  Hx gout in foot unsure if related.

## 2022-02-24 NOTE — ED Provider Notes (Addendum)
[facility]   [id number] 02/24/22 Arrival Time: 0957  ASSESSMENT & PLAN:  1. Idiopathic chronic gout, right hand, with tophus (tophi)    -History, exam, and radiographic findings are all consistent with gout flare the fourth digit DIP joint.  Will treat this with a prednisone taper for acute symptomatic relief.  I did discuss with the patient that since she has tophi involvement, she likely would benefit from being on a urate lowering medication such as allopurinol.  I gave her the name of Dr. Bartholomew Crews in the family medicine residency clinic here so she can establish care with a PCP here and have this managed longitudinally.  All questions were answered she agrees to plan.  This is a chronic condition with exacerbation and I performed prescription drug management  Meds ordered this encounter  Medications   predniSONE (STERAPRED UNI-PAK 21 TAB) 10 MG (21) TBPK tablet    Sig: Take by mouth daily. Take 6 tabs by mouth daily  for 2 days, then 5 tabs for 2 days, then 4 tabs for 2 days, then 3 tabs for 2 days, 2 tabs for 2 days, then 1 tab by mouth daily for 2 days    Dispense:  42 tablet    Refill:  0   Discharge Instructions   None     Follow-up Information     Schedule an appointment as soon as possible for a visit  with Orvis Brill, DO.   Specialty: Family Medicine Why: to discuss long term gout treatment Contact information: [address] [place] [address] [phone]                  Reviewed expectations re: course of current medical issues. Questions answered. Outlined signs and symptoms indicating need for more acute intervention. Patient verbalized understanding. After Visit Summary given.   SUBJECTIVE: Pleasant 72 year old female with past medical history of gout comes to clinic to be evaluated for right hand pain.  She was diagnosed with gout here this urgent care last year when she had a gout flare in her left foot.  She was  treated with meloxicam.  She has not been on any chronic urate lowering medications.  She does not follow with any primary care doctor for this.  Unfortunately, she has had a recurrence of her gout over the last 2 weeks.  She had swelling and tophi formation at the right fourth DIP joint.  She tried taking meloxicam but this did not make any better.  She does not eat red meat.  She does not drink alcohol.  She is here to discuss treatment options.  No LMP recorded. Patient is postmenopausal. Past Surgical History:  Procedure Laterality Date   right shoulder sugury     TONSILLECTOMY       OBJECTIVE:  Vitals:   02/24/22 1123  BP: (!) 127/51  Pulse: 63  Resp: 18  Temp: 98.3 F (36.8 C)  TempSrc: Oral  SpO2: 100%     Physical Exam Vitals reviewed.  Constitutional:      General: She is not in acute distress. Cardiovascular:     Rate and Rhythm: Normal rate.  Pulmonary:     Effort: Pulmonary effort is normal.  Musculoskeletal:     Comments: R hand -there is a tophi at the dorsal aspect of the right fourth digit DIP.  There is associated soft tissue swelling there.  The joint is cool to the touch and mildly tender to palpation.  Flexion and  extension at the 4th DIP are limited.  She has full flexion and extension at the PIP joint.  Neurological:     Mental Status: She is alert.      Labs: Results for orders placed or performed in visit on 12/07/19  HgB A1c  Result Value Ref Range   Hgb A1c MFr Bld 6.4 (H) 4.8 - 5.6 %   Est. average glucose Bld gHb Est-mCnc 137 mg/dL  Basic metabolic panel  Result Value Ref Range   Glucose 87 65 - 99 mg/dL   BUN 25 8 - 27 mg/dL   Creatinine, Ser 1.23 (H) 0.57 - 1.00 mg/dL   GFR calc non Af Amer 45 (L) >59 mL/min/1.73   GFR calc Af Amer 52 (L) >59 mL/min/1.73   BUN/Creatinine Ratio 20 12 - 28   Sodium 137 134 - 144 mmol/L   Potassium 4.1 3.5 - 5.2 mmol/L   Chloride 100 96 - 106 mmol/L   CO2 26 20 - 29 mmol/L   Calcium 9.5 8.7 - 10.3  mg/dL  Lipid panel  Result Value Ref Range   Cholesterol, Total 169 100 - 199 mg/dL   Triglycerides 60 0 - 149 mg/dL   HDL 56 >39 mg/dL   VLDL Cholesterol Cal 12 5 - 40 mg/dL   LDL Chol Calc (NIH) 101 (H) 0 - 99 mg/dL   Chol/HDL Ratio 3.0 0.0 - 4.4 ratio   Labs Reviewed - No data to display  Imaging: No results found.   Allergies  Allergen Reactions   Lovastatin Other (See Comments)    REACTION: leg cramps   Metformin Nausea And Vomiting    REACTION: GI upset   Penicillins Other (See Comments)    REACTION: erythema multiforme 2001                                               Past Medical History:  Diagnosis Date   ALLERGIC RHINITIS 05/01/2007   Allergy    ANKLE PAIN, RIGHT 09/02/2009   ANXIETY, SITUATIONAL 09/02/2009   CARPAL TUNNEL SYNDROME, RIGHT 77/93/9030   DM w/o Complication Type II 10/24/3005   Generalized osteoarthritis 10/27/2012   bilat ankles, knees, hands and wrists   GERD 11/15/2006   GLAUCOMA 07/04/2008   Gout 11/14/2014   HYPERLIPIDEMIA 05/01/2007   HYPERTENSION 11/15/2006   HYPOKALEMIA 10/02/2008   Low back pain    OSTEOPENIA 09/02/2009   Peripheral neuropathy 11/14/2014   THUMB PAIN, RIGHT 04/02/2009    Social History   Socioeconomic History   Marital status: Single    Spouse name: Not on file   Number of children: 3   Years of education: Not on file   Highest education level: Not on file  Occupational History   Occupation: FLOATER    Employer: [employer]   Occupation: Psychologist, prison and probation services, as well as Paediatric nurse  Tobacco Use   Smoking status: Never   Smokeless tobacco: Never  Vaping Use   Vaping Use: Never used  Substance and Sexual Activity   Alcohol use: No   Drug use: No   Sexual activity: Not on file  Other Topics Concern   Not on file  Social History Narrative   Not on file   Social Determinants of Health   Financial Resource Strain: Not on file  Food Insecurity: Not on file  Transportation Needs: Not on file  Physical Activity:  Not on file  Stress: Not on file  Social Connections: Not on file  Intimate Partner Violence: Not on file    Family History  Problem Relation Age of Onset   Heart disease Mother    Hypertension Mother    Cancer Brother        throat cancer, smoker   Cancer Brother    Breast cancer Granddaughter    Heart disease Other    Hypertension Other    Cancer Other        lung cancer   Colon cancer Neg Hx    Colon polyps Neg Hx    Esophageal cancer Neg Hx    Stomach cancer Neg Hx    Rectal cancer Neg Hx       Yun Gutierrez, Dorian Pod, MD 02/24/22 1303    Leilanny Fluitt, Dorian Pod, MD 03/01/22 1757

## 2022-03-30 ENCOUNTER — Other Ambulatory Visit: Payer: Self-pay | Admitting: Nurse Practitioner

## 2022-03-30 DIAGNOSIS — E1165 Type 2 diabetes mellitus with hyperglycemia: Secondary | ICD-10-CM | POA: Diagnosis not present

## 2022-03-30 DIAGNOSIS — I10 Essential (primary) hypertension: Secondary | ICD-10-CM | POA: Diagnosis not present

## 2022-03-30 DIAGNOSIS — Z13820 Encounter for screening for osteoporosis: Secondary | ICD-10-CM

## 2022-03-30 DIAGNOSIS — Z1331 Encounter for screening for depression: Secondary | ICD-10-CM | POA: Diagnosis not present

## 2022-03-30 DIAGNOSIS — D509 Iron deficiency anemia, unspecified: Secondary | ICD-10-CM | POA: Diagnosis not present

## 2022-03-30 DIAGNOSIS — E785 Hyperlipidemia, unspecified: Secondary | ICD-10-CM | POA: Diagnosis not present

## 2022-03-30 DIAGNOSIS — M109 Gout, unspecified: Secondary | ICD-10-CM | POA: Diagnosis not present

## 2022-03-30 DIAGNOSIS — Z0000 Encounter for general adult medical examination without abnormal findings: Secondary | ICD-10-CM | POA: Diagnosis not present

## 2022-03-30 DIAGNOSIS — R634 Abnormal weight loss: Secondary | ICD-10-CM | POA: Diagnosis not present

## 2022-03-30 DIAGNOSIS — N1831 Chronic kidney disease, stage 3a: Secondary | ICD-10-CM | POA: Diagnosis not present

## 2022-04-05 DIAGNOSIS — G4700 Insomnia, unspecified: Secondary | ICD-10-CM | POA: Diagnosis not present

## 2022-04-05 DIAGNOSIS — N183 Chronic kidney disease, stage 3 unspecified: Secondary | ICD-10-CM | POA: Diagnosis not present

## 2022-04-05 DIAGNOSIS — D509 Iron deficiency anemia, unspecified: Secondary | ICD-10-CM | POA: Diagnosis not present

## 2022-04-05 DIAGNOSIS — M109 Gout, unspecified: Secondary | ICD-10-CM | POA: Diagnosis not present

## 2022-07-22 ENCOUNTER — Encounter (HOSPITAL_COMMUNITY): Payer: Self-pay

## 2022-07-22 ENCOUNTER — Ambulatory Visit (HOSPITAL_COMMUNITY)
Admission: EM | Admit: 2022-07-22 | Discharge: 2022-07-22 | Disposition: A | Discharge status: Home / Self Care | Payer: Medicare HMO | Attending: Sports Medicine | Admitting: Sports Medicine

## 2022-07-22 DIAGNOSIS — M25512 Pain in left shoulder: Secondary | ICD-10-CM | POA: Diagnosis not present

## 2022-07-22 DIAGNOSIS — M25511 Pain in right shoulder: Secondary | ICD-10-CM | POA: Diagnosis not present

## 2022-07-22 DIAGNOSIS — M62838 Other muscle spasm: Secondary | ICD-10-CM | POA: Diagnosis not present

## 2022-07-22 MED ORDER — CYCLOBENZAPRINE HCL 5 MG PO TABS: 5.0000 mg | ORAL_TABLET | Freq: Every day | ORAL | 0 refills | Status: DC

## 2022-07-22 MED ORDER — MELOXICAM 7.5 MG PO TABS: 7.5000 mg | ORAL_TABLET | Freq: Every day | ORAL | 0 refills | Status: DC

## 2022-07-22 NOTE — Discharge Instructions (Signed)
Your pain today is concentrated around your AC joints.  I have sent to your pharmacy anti-inflammatory medicine, meloxicam that you have had in the past to take for the pain and swelling.  I have also sent a refill of the Flexeril 5 mg that you have had in the past to help with the tightness in your neck muscles.  Take this medicine in the evenings but caution when getting up from bed for dizziness.  Follow-up with your primary care provider and/or orthopedics, I provided the information here to discuss injection to that area if your symptoms have not improved with the medications provided.

## 2022-07-22 NOTE — ED Triage Notes (Signed)
Pt states "knots" on her shoulders for the past week.  States her shoulders hurt when she moves.  States she has not taken anything at home for the pain.

## 2022-07-22 NOTE — ED Provider Notes (Signed)
[facility]    CSN: [id number] Arrival date & time: 07/22/22  1108      History   Chief Complaint Chief Complaint  Patient presents with   Shoulder Pain    HPI SHILYNN RASCHER is a 72 y.o. female.   She is here today with chief complaint of bilateral shoulder pain and knots.  She reports she noticed this about a week ago and it has not improved.  She tried some topical Voltaren gel however the pain is keeping her from sleep at night so she presented here.  She denies any trauma or injury to her shoulders.  She also has some tenderness in her neck.  She denies any numbness or tingling or radiation of pain down into her arms.   Shoulder Pain   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 Type II 11/15/2006   Generalized osteoarthritis 10/27/2012   bilat ankles, knees, hands and wrists   GERD 11/15/2006   GLAUCOMA 07/04/2008   Gout 11/14/2014   HYPERLIPIDEMIA 05/01/2007   HYPERTENSION 11/15/2006   HYPOKALEMIA 10/02/2008   Low back pain    OSTEOPENIA 09/02/2009   Peripheral neuropathy 11/14/2014   THUMB PAIN, RIGHT 04/02/2009    Patient Active Problem List   Diagnosis Date Noted   Prediabetes 12/07/2019   Abnormal stress test 09/14/2018   Abnormal EKG 07/27/2018   SOB (shortness of breath) 07/06/2018   Cardiomegaly 07/06/2018   Peripheral neuropathy 11/14/2014   Gout 11/14/2014   Right foot pain 07/18/2014   Cellulitis of left foot 07/18/2014   Hypokalemia 05/08/2014   Pes planus of both feet 05/01/2013   Osteoarthritis of both knees 12/01/2012   Swelling of both ankles 10/31/2012   Generalized osteoarthritis 10/27/2012   Recurrent falls 09/11/2012   Screening cholesterol level 05/12/2010   ANXIETY, SITUATIONAL 09/02/2009   OSTEOPENIA 09/02/2009   GLAUCOMA 07/04/2008   Mixed hyperlipidemia  05/01/2007   ALLERGIC RHINITIS 05/01/2007   Diabetes mellitus with neuropathy (HCC) 11/15/2006   CARPAL TUNNEL SYNDROME, RIGHT 11/15/2006   Essential hypertension 11/15/2006   GERD 11/15/2006    Past Surgical History:  Procedure Laterality Date   right shoulder sugury     TONSILLECTOMY      OB History   No obstetric history on file.      Home Medications    Prior to Admission medications   Medication Sig Start Date End Date Taking? Authorizing Provider  amLODipine (NORVASC) 5 MG tablet Take 1 tablet (5 mg total) by mouth daily. 12/08/20 08/12/21  Patwardhan, Anabel Bene, MD  aspirin 81 MG chewable tablet Chew by mouth every other day.    [provider]  cyclobenzaprine (FLEXERIL) 5 MG tablet Take 1 tablet (5 mg total) by mouth at bedtime. 07/22/22   Claudie Leach, DO  dextromethorphan-guaiFENesin (MUCINEX DM) 30-600 MG 12hr tablet Take 1 tablet by mouth 2 (two) times daily. 09/14/21   Ellsworth Lennox, PA-C  ergocalciferol (VITAMIN D2) 1.25 MG (50000 UT) capsule Vitamin D2 1,250 mcg (50,000 unit) capsule    [provider]  fish oil-omega-3 fatty acids 1000 MG capsule Take 1 g by mouth 2 (two) times a day.     [provider]  fluticasone (FLONASE) 50 MCG/ACT nasal spray Place 2 sprays into both nostrils as needed. 09/14/21   Ellsworth Lennox, PA-C  furosemide (LASIX) 20 MG tablet Take 1 tablet (20 mg total) by mouth as needed. 08/12/21   Patwardhan, Manish J, MD  glimepiride (AMARYL) 2 MG tablet TAKE ONE TABLET BY MOUTH ONCE DAILY BEFORE BREAKFAST Patient taking differently: Take 2 mg by mouth daily with breakfast. 11/06/15   Corwin Levins, MD  meloxicam (MOBIC) 7.5 MG tablet Take 1 tablet (7.5 mg total) by mouth daily. 07/22/22   Claudie Leach, DO  nitroGLYCERIN (NITROSTAT) 0.4 MG SL tablet Place 1 tablet (0.4 mg total) under the tongue every 5 (five) minutes as needed for chest pain. 09/15/18 08/12/21  Patwardhan, Anabel Bene, MD  olmesartan-hydrochlorothiazide  (BENICAR HCT) 40-25 MG tablet TAKE ONE TABLET BY MOUTH ONCE DAILY. Patient taking differently: Take 1 tablet by mouth daily. 11/06/15   Corwin Levins, MD  predniSONE (STERAPRED UNI-PAK 21 TAB) 10 MG (21) TBPK tablet Take by mouth daily. Take 6 tabs by mouth daily  for 2 days, then 5 tabs for 2 days, then 4 tabs for 2 days, then 3 tabs for 2 days, 2 tabs for 2 days, then 1 tab by mouth daily for 2 days 02/24/22   Rafoth, Baldemar Friday, MD  rosuvastatin (CRESTOR) 20 MG tablet Take 0.5 tablets (10 mg total) by mouth daily. 12/08/20   Patwardhan, Anabel Bene, MD  traZODone (DESYREL) 100 MG tablet as needed. 07/11/18   [provider]    Family History Family History  Problem Relation Age of Onset   Heart disease Mother    Hypertension Mother    Cancer Brother        throat cancer, smoker   Cancer Brother    Breast cancer Granddaughter    Heart disease Other    Hypertension Other    Cancer Other        lung cancer   Colon cancer Neg Hx    Colon polyps Neg Hx    Esophageal cancer Neg Hx    Stomach cancer Neg Hx    Rectal cancer Neg Hx     Social History Social History   Tobacco Use   Smoking status: Never   Smokeless tobacco: Never  Vaping Use   Vaping Use: Never used  Substance Use Topics   Alcohol use: No   Drug use: No     Allergies   Lovastatin, Metformin, and Penicillins   Review of Systems Review of Systems as listed above in HPI   Physical Exam Triage Vital Signs ED Triage Vitals [07/22/22 1257]  Enc Vitals Group     BP 123/60     Pulse Rate (!) 59     Resp 16     Temp 97.6 F (36.4 C)     Temp Source Oral     SpO2 99 %     Weight      Height      Head Circumference      Peak Flow      Pain Score 7     Pain Loc      Pain Edu?      Excl. in GC?    No data found.  Updated Vital Signs BP 123/60 (BP Location: Left Arm)   Pulse (!) 59   Temp 97.6 F (36.4 C) (Oral)   Resp 16   SpO2 99%   Physical Exam Vitals reviewed.  Constitutional:       General: She is not in acute distress.    Appearance: Normal appearance. She is not ill-appearing, toxic-appearing or diaphoretic.  Skin:    General: Skin is warm.  Neurological:     Mental Status: She is alert.   Seated comfortably in exam room.  She has tenderness over palpation of bilateral AC joints.  Some decreased range of motion of the shoulder secondary to pain.  She also has some tenderness in her upper trapezius muscles.  No trigger points palpated today.   UC Treatments / Results  Labs (all labs ordered are listed, but only abnormal results are displayed) Labs Reviewed - No data to display  EKG   Radiology No results found.  Procedures Procedures (including critical care time)  Medications Ordered in UC Medications - No data to display  Initial Impression / Assessment and Plan / UC Course  I have reviewed the triage vital signs and the nursing notes.  Pertinent labs & imaging results that were available during my care of the patient were reviewed by me and considered in my medical decision making (see chart for details).     AC joint arthropathy She may continue topical Voltaren gel but I also sent to her pharmacy meloxicam 7.5 to help with her pain and Flexeril 10 mg to help with her neck spasms.  Recommend she follow-up with her primary care provider or orthopedics, I provided on-call groups information for further evaluation and possible steroid injection in her [facility] joint as needed. Final Clinical Impressions(s) / UC Diagnoses   Final diagnoses:  Acute pain of both shoulders  Cervical paraspinal muscle spasm     Discharge Instructions      Your pain today is concentrated around your AC joints.  I have sent to your pharmacy anti-inflammatory medicine, meloxicam that you have had in the past to take for the pain and swelling.  I have also sent a refill of the Flexeril 5 mg that you have had in the past to help with the tightness in your neck muscles.  Take  this medicine in the evenings but caution when getting up from bed for dizziness.  Follow-up with your primary care provider and/or orthopedics, I provided the information here to discuss injection to that area if your symptoms have not improved with the medications provided.   ED Prescriptions     Medication Sig Dispense Auth. Provider   cyclobenzaprine (FLEXERIL) 5 MG tablet Take 1 tablet (5 mg total) by mouth at bedtime. 14 tablet Janyth Riera A, DO   meloxicam (MOBIC) 7.5 MG tablet Take 1 tablet (7.5 mg total) by mouth daily. 30 tablet Gillermo Murdoch A, DO      PDMP not reviewed this encounter.   Gillermo Murdoch A, DO 07/22/22 1407

## 2022-09-13 ENCOUNTER — Ambulatory Visit: Payer: Medicare HMO | Admitting: Cardiology

## 2022-10-20 ENCOUNTER — Telehealth: Payer: Self-pay

## 2022-10-20 NOTE — Telephone Encounter (Signed)
Left message for patient to call back and schedule new patient appointment.  Schedule for 9/23 with Dr Alvester Morin at [facility] or 3:30pm

## 2022-10-21 ENCOUNTER — Encounter: Payer: Self-pay | Admitting: Gynecologic Oncology

## 2022-10-21 NOTE — Progress Notes (Signed)
GYNECOLOGIC ONCOLOGY NEW PATIENT CONSULTATION   Patient Name: Alice Hickman  Patient Age: 72 y.o. Date of Service: 10/22/22 Referring Provider: Mitchel Honour, MD  Primary Care Provider: Maryagnes Amos, FNP Consulting Provider: Eugene Garnet, MD   Assessment/Plan:  Postmenopausal patient with uterine serous carcinoma.  We reviewed the nature of endometrial cancer and its recommended surgical staging, including total hysterectomy, bilateral salpingo-oophorectomy, and lymph node assessment. The patient is a suitable candidate for staging via a minimally invasive approach to surgery.  We reviewed that robotic assistance would be used to complete the surgery.   We discussed that most endometrial cancer is detected early, however, we reviewed that adjuvant therapy will likely be recommended based on the patient's biopsy, however, we will defer to final pathology results.    Given her high risk histology, I recommend CT scan preoperatively to rule out metastatic disease. We will also get a CA-125 given serous histology.  We reviewed the sentinel lymph node technique. Risks and benefits of sentinel lymph node biopsy was reviewed. We reviewed the technique and ICG dye. The patient DOES NOT have an iodine allergy or known liver dysfunction. We reviewed the false negative rate (0.4%), and that 3% of patients with metastatic disease will not have it detected by SLN biopsy in endometrial cancer. A low risk of allergic reaction to the dye, <0.2% for ICG, has been reported. We also discussed that in the case of failed mapping, which occurs 40% of the time, a bilateral or unilateral lymphadenectomy will be performed at the surgeon's discretion.   Potential benefits of sentinel nodes including a higher detection rate for metastasis due to ultrastaging and potential reduction in operative morbidity. However, there remains uncertainty as to the role for treatment of micrometastatic disease. Further,  the benefit of operative morbidity associated with the SLN technique in endometrial cancer is not yet completely known. In other patient populations (e.g. the cervical cancer population) there has been observed reductions in morbidity with SLN biopsy compared to pelvic lymphadenectomy. Lymphedema, nerve dysfunction and lymphocysts are all potential risks with the SLN technique as with complete lymphadenectomy. Additional risks to the patient include the risk of damage to an internal organ while operating in an altered view (e.g. the black and white image of the robotic fluorescence imaging mode).   We discussed the plan for a robotic assisted hysterectomy, bilateral salpingo-oophorectomy, sentinel lymph node evaluation, possible lymph node dissection, omentectomy, possible tumor debulking, possible laparotomy. The risks of surgery were discussed in detail and she understands these to include infection; wound separation; hernia; vaginal cuff separation, injury to adjacent organs such as bowel, bladder, blood vessels, ureters and nerves; bleeding which may require blood transfusion; anesthesia risk; thromboembolic events; possible death; unforeseen complications; possible need for re-exploration; medical complications such as heart attack, stroke, pleural effusion and pneumonia; and, if full lymphadenectomy is performed the risk of lymphedema and lymphocyst. The patient will receive DVT and antibiotic prophylaxis as indicated. She voiced a clear understanding. She had the opportunity to ask questions. Perioperative instructions were reviewed with her. Prescriptions for post-op medications were sent to her pharmacy of choice.  Plan to send medical clearance for surgery to the patient's primary care provider.  A copy of this note was sent to the patient's referring provider.   65 minutes of total time was spent for this patient encounter, including preparation, face-to-face counseling with the patient and  coordination of care, and documentation of the encounter.  Eugene Garnet, MD  Division of Gynecologic  Oncology  Department of Obstetrics and Gynecology  [facility]  ___________________________________________  Chief Complaint: No chief complaint on file.   History of Present Illness:  Alice Hickman is a 72 y.o. y.o. female who is seen in consultation at the request of Dr. Langston Masker for an evaluation of endometrial cancer.  The patient was initially seen on 8/16 with postmenopausal bleeding x 3 days. 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   The patient presents with her daughter today.  Another daughter joins by phone for part of the visit.  Patient notes having several days of postmenopausal bleeding.  This started as heavy spotting requiring the use of a panty liner at first, slowly tapered off and ultimately stopped.  This is the first postmenopausal bleeding she has had.  She denies any associated cramping, pelvic or abdominal pain.  She notes appetite at baseline more recently has been so-so, dating that food just does not taste the same.  She denies any recent weight changes.  Reports normal bowel function.  Has some frequency but notes drinking a lot of water.  Medical history is notable for type 2 diabetes.  Does not check her sugars at home.  This is controlled with oral medication.  The patient lives with her daughter.  PAST MEDICAL HISTORY:  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 Type II 11/15/2006   Generalized osteoarthritis 10/27/2012   bilat ankles, knees, hands and wrists   GERD 11/15/2006   GLAUCOMA 07/04/2008   Gout 11/14/2014   HYPERLIPIDEMIA 05/01/2007   HYPERTENSION 11/15/2006   HYPOKALEMIA 10/02/2008   Low back pain     OSTEOPENIA 09/02/2009   Peripheral neuropathy 11/14/2014   THUMB PAIN, RIGHT 04/02/2009     PAST SURGICAL HISTORY:  Past Surgical History:  Procedure Laterality Date   right shoulder sugury     TONSILLECTOMY      OB/GYN HISTORY:  OB History  Gravida Para Term Preterm AB Living  3 3          SAB IAB Ectopic Multiple Live Births               # Outcome Date GA Lbr Len/2nd Weight Sex Type Anes PTL Lv  3 Para           2 Para           1 Para             No LMP recorded. Patient is postmenopausal.  Hx of HRT: denies Hx of STDs: denies Last pap: 2024 per patient report History of abnormal pap smears: denies  SCREENING STUDIES:  Last mammogram: 2023  Last colonoscopy: 2020  MEDICATIONS: Outpatient Encounter Medications as of 10/22/2022  Medication Sig   allopurinol (ZYLOPRIM) 100 MG tablet TAKE 1 TABLET BY MOUTH TWICE DAILY AS DIRECTED FOR GOUT   aspirin 81 MG chewable tablet Chew by mouth every other day.   fish oil-omega-3 fatty acids 1000 MG capsule Take 1 g by mouth 2 (two) times a day.    furosemide (LASIX) 20 MG tablet Take 1 tablet (20 mg total) by mouth as needed.   glimepiride (AMARYL) 2 MG tablet TAKE ONE TABLET BY MOUTH ONCE DAILY BEFORE BREAKFAST (Patient taking differently: Take 2 mg by mouth daily with breakfast.)  meloxicam (MOBIC) 7.5 MG tablet Take 1 tablet (7.5 mg total) by mouth daily.   olmesartan-hydrochlorothiazide (BENICAR HCT) 40-25 MG tablet TAKE ONE TABLET BY MOUTH ONCE DAILY. (Patient taking differently: Take 1 tablet by mouth daily.)   senna-docusate (SENOKOT-S) 8.6-50 MG tablet Take 2 tablets by mouth at bedtime. For AFTER surgery, do not take if having diarrhea   traMADol (ULTRAM) 50 MG tablet Take 1 tablet (50 mg total) by mouth every 6 (six) hours as needed for severe pain. For AFTER surgery only, do not take and drive   MITIGARE 0.6 MG CAPS TAKE 2 TABLETS BY MOUTH TODAY. MAY TAKE 1 TABLET BY MOUTH IN 1 HOUR IF NO IMPROVEMENT. REPEAT DOSE FOR 3  DAYS   nitroGLYCERIN (NITROSTAT) 0.4 MG SL tablet Place 1 tablet (0.4 mg total) under the tongue every 5 (five) minutes as needed for chest pain.   [DISCONTINUED] amLODipine (NORVASC) 5 MG tablet Take 1 tablet (5 mg total) by mouth daily.   [DISCONTINUED] cyclobenzaprine (FLEXERIL) 5 MG tablet Take 1 tablet (5 mg total) by mouth at bedtime.   [DISCONTINUED] dextromethorphan-guaiFENesin (MUCINEX DM) 30-600 MG 12hr tablet Take 1 tablet by mouth 2 (two) times daily.   [DISCONTINUED] ergocalciferol (VITAMIN D2) 1.25 MG (50000 UT) capsule Vitamin D2 1,250 mcg (50,000 unit) capsule   [DISCONTINUED] fluticasone (FLONASE) 50 MCG/ACT nasal spray Place 2 sprays into both nostrils as needed.   [DISCONTINUED] predniSONE (STERAPRED UNI-PAK 21 TAB) 10 MG (21) TBPK tablet Take by mouth daily. Take 6 tabs by mouth daily  for 2 days, then 5 tabs for 2 days, then 4 tabs for 2 days, then 3 tabs for 2 days, 2 tabs for 2 days, then 1 tab by mouth daily for 2 days   [DISCONTINUED] rosuvastatin (CRESTOR) 20 MG tablet Take 0.5 tablets (10 mg total) by mouth daily.   [DISCONTINUED] traZODone (DESYREL) 100 MG tablet as needed.   No facility-administered encounter medications on file as of 10/22/2022.    ALLERGIES:  Allergies  Allergen Reactions   Lovastatin Other (See Comments)    REACTION: leg cramps   Metformin Nausea And Vomiting    REACTION: GI upset   Penicillins Other (See Comments)    REACTION: erythema multiforme 2001     FAMILY HISTORY:  Family History  Problem Relation Age of Onset   Heart disease Mother    Hypertension Mother    Cancer Brother        throat cancer, smoker   Cancer Brother    Breast cancer Granddaughter    Breast cancer Niece    Heart disease Other    Hypertension Other    Colon cancer Neg Hx    Colon polyps Neg Hx    Esophageal cancer Neg Hx    Stomach cancer Neg Hx    Rectal cancer Neg Hx      SOCIAL HISTORY:  Social Connections: Not on file    REVIEW OF SYSTEMS:   Denies appetite changes, fevers, chills, fatigue, unexplained weight changes. Denies hearing loss, neck lumps or masses, mouth sores, ringing in ears or voice changes. Denies cough or wheezing.  Denies shortness of breath. Denies chest pain or palpitations. Denies leg swelling. Denies abdominal distention, pain, blood in stools, constipation, diarrhea, nausea, vomiting, or early satiety. Denies pain with intercourse, dysuria, frequency, hematuria or incontinence. Denies hot flashes, pelvic pain, vaginal bleeding or vaginal discharge.   Denies joint pain, back pain or muscle pain/cramps. Denies itching, rash, or wounds. Denies dizziness, headaches, numbness or seizures.  Denies swollen lymph nodes or glands, denies easy bruising or bleeding. Denies anxiety, depression, confusion, or decreased concentration.  Physical Exam:  Vital Signs for this encounter:  Blood pressure 133/60, pulse 62, temperature 98.1 F (36.7 C), temperature source Oral, resp. rate 18, height 5\' 1"  (1.549 m), weight 174 lb (78.9 kg), SpO2 100%. Body mass index is 32.88 kg/m. General: Alert, oriented, no acute distress.  HEENT: Normocephalic, atraumatic. Sclera anicteric.  Chest: Clear to auscultation bilaterally. No wheezes, rhonchi, or rales. Cardiovascular: Regular rate and rhythm, no murmurs, rubs, or gallops.  Abdomen: Obese. Normoactive bowel sounds. Soft, nondistended, nontender to palpation. No masses or hepatosplenomegaly appreciated. No palpable fluid wave.  Extremities: Grossly normal range of motion. Warm, well perfused. No edema bilaterally.  Skin: No rashes or lesions.  Lymphatics: No cervical, supraclavicular, or inguinal adenopathy.  GU:  Normal external female genitalia. No lesions. No discharge or bleeding.             Bladder/urethra:  No lesions or masses, well supported bladder             Vagina: Mildly atrophic, no lesions.             Cervix: Normal appearing, no lesions.             Uterus:   Small, mobile, no parametrial involvement or nodularity.             Adnexa: No masses.  Rectal: Deferred.  LABORATORY AND RADIOLOGIC DATA:  Outside medical records were reviewed to synthesize the above history, along with the history and physical obtained during the visit.   Lab Results  Component Value Date   WBC 5.7 07/01/2018   HGB 12.1 07/01/2018   HCT 38.5 07/01/2018   PLT 214 07/01/2018   GLUCOSE 87 12/14/2019   CHOL 169 12/14/2019   TRIG 60 12/14/2019   HDL 56 12/14/2019   LDLDIRECT 136.0 09/02/2009   LDLCALC 101 (H) 12/14/2019   ALT 18 07/01/2018   AST 17 07/01/2018   NA 137 12/14/2019   K 4.1 12/14/2019   CL 100 12/14/2019   CREATININE 1.23 (H) 12/14/2019   BUN 25 12/14/2019   CO2 26 12/14/2019   TSH 1.24 11/14/2014   HGBA1C 6.4 (H) 12/14/2019   MICROALBUR <0.7 11/14/2014

## 2022-10-21 NOTE — Telephone Encounter (Signed)
Pt's daughter, Summer, called back. Appointment scheduled for 9/20 @ 11:15 w/Dr. Pricilla Holm

## 2022-10-21 NOTE — H&P (View-Only) (Signed)
GYNECOLOGIC ONCOLOGY NEW PATIENT CONSULTATION   Patient Name: Alice Hickman  Patient Age: 72 y.o. Date of Service: 10/22/22 Referring Provider: Mitchel Honour, MD  Primary Care Provider: Maryagnes Amos, FNP Consulting Provider: Eugene Garnet, MD   Assessment/Plan:  Postmenopausal patient with uterine serous carcinoma.  We reviewed the nature of endometrial cancer and its recommended surgical staging, including total hysterectomy, bilateral salpingo-oophorectomy, and lymph node assessment. The patient is a suitable candidate for staging via a minimally invasive approach to surgery.  We reviewed that robotic assistance would be used to complete the surgery.   We discussed that most endometrial cancer is detected early, however, we reviewed that adjuvant therapy will likely be recommended based on the patient's biopsy, however, we will defer to final pathology results.    Given her high risk histology, I recommend CT scan preoperatively to rule out metastatic disease. We will also get a CA-125 given serous histology.  We reviewed the sentinel lymph node technique. Risks and benefits of sentinel lymph node biopsy was reviewed. We reviewed the technique and ICG dye. The patient DOES NOT have an iodine allergy or known liver dysfunction. We reviewed the false negative rate (0.4%), and that 3% of patients with metastatic disease will not have it detected by SLN biopsy in endometrial cancer. A low risk of allergic reaction to the dye, <0.2% for ICG, has been reported. We also discussed that in the case of failed mapping, which occurs 40% of the time, a bilateral or unilateral lymphadenectomy will be performed at the surgeon's discretion.   Potential benefits of sentinel nodes including a higher detection rate for metastasis due to ultrastaging and potential reduction in operative morbidity. However, there remains uncertainty as to the role for treatment of micrometastatic disease. Further,  the benefit of operative morbidity associated with the SLN technique in endometrial cancer is not yet completely known. In other patient populations (e.g. the cervical cancer population) there has been observed reductions in morbidity with SLN biopsy compared to pelvic lymphadenectomy. Lymphedema, nerve dysfunction and lymphocysts are all potential risks with the SLN technique as with complete lymphadenectomy. Additional risks to the patient include the risk of damage to an internal organ while operating in an altered view (e.g. the black and white image of the robotic fluorescence imaging mode).   We discussed the plan for a robotic assisted hysterectomy, bilateral salpingo-oophorectomy, sentinel lymph node evaluation, possible lymph node dissection, omentectomy, possible tumor debulking, possible laparotomy. The risks of surgery were discussed in detail and she understands these to include infection; wound separation; hernia; vaginal cuff separation, injury to adjacent organs such as bowel, bladder, blood vessels, ureters and nerves; bleeding which may require blood transfusion; anesthesia risk; thromboembolic events; possible death; unforeseen complications; possible need for re-exploration; medical complications such as heart attack, stroke, pleural effusion and pneumonia; and, if full lymphadenectomy is performed the risk of lymphedema and lymphocyst. The patient will receive DVT and antibiotic prophylaxis as indicated. She voiced a clear understanding. She had the opportunity to ask questions. Perioperative instructions were reviewed with her. Prescriptions for post-op medications were sent to her pharmacy of choice.  Plan to send medical clearance for surgery to the patient's primary care provider.  A copy of this note was sent to the patient's referring provider.   70 minutes of total time was spent for this patient encounter, including preparation, face-to-face counseling with the patient and  coordination of care, and documentation of the encounter.  Eugene Garnet, MD  Division of Gynecologic  Oncology  Department of Obstetrics and Gynecology  [facility]  ___________________________________________  Chief Complaint: No chief complaint on file.   History of Present Illness:  Alice Hickman is a 72 y.o. y.o. female who is seen in consultation at the request of Dr. Langston Masker for an evaluation of endometrial cancer.  The patient was initially seen on 8/16 with postmenopausal bleeding x 3 days. 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   The patient presents with her daughter today.  Another daughter joins by phone for part of the visit.  Patient notes having several days of postmenopausal bleeding.  This started as heavy spotting requiring the use of a panty liner at first, slowly tapered off and ultimately stopped.  This is the first postmenopausal bleeding she has had.  She denies any associated cramping, pelvic or abdominal pain.  She notes appetite at baseline more recently has been so-so, dating that food just does not taste the same.  She denies any recent weight changes.  Reports normal bowel function.  Has some frequency but notes drinking a lot of water.  Medical history is notable for type 2 diabetes.  Does not check her sugars at home.  This is controlled with oral medication.  The patient lives with her daughter.  PAST MEDICAL HISTORY:  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 Type II 11/15/2006   Generalized osteoarthritis 10/27/2012   bilat ankles, knees, hands and wrists   GERD 11/15/2006   GLAUCOMA 07/04/2008   Gout 11/14/2014   HYPERLIPIDEMIA 05/01/2007   HYPERTENSION 11/15/2006   HYPOKALEMIA 10/02/2008   Low back pain     OSTEOPENIA 09/02/2009   Peripheral neuropathy 11/14/2014   THUMB PAIN, RIGHT 04/02/2009     PAST SURGICAL HISTORY:  Past Surgical History:  Procedure Laterality Date   right shoulder sugury     TONSILLECTOMY      OB/GYN HISTORY:  OB History  Gravida Para Term Preterm AB Living  3 3          SAB IAB Ectopic Multiple Live Births               # Outcome Date GA Lbr Len/2nd Weight Sex Type Anes PTL Lv  3 Para           2 Para           1 Para             No LMP recorded. Patient is postmenopausal.  Hx of HRT: denies Hx of STDs: denies Last pap: 2024 per patient report History of abnormal pap smears: denies  SCREENING STUDIES:  Last mammogram: 2023  Last colonoscopy: 2020  MEDICATIONS: Outpatient Encounter Medications as of 10/22/2022  Medication Sig   allopurinol (ZYLOPRIM) 100 MG tablet TAKE 1 TABLET BY MOUTH TWICE DAILY AS DIRECTED FOR GOUT   aspirin 81 MG chewable tablet Chew by mouth every other day.   fish oil-omega-3 fatty acids 1000 MG capsule Take 1 g by mouth 2 (two) times a day.    furosemide (LASIX) 20 MG tablet Take 1 tablet (20 mg total) by mouth as needed.   glimepiride (AMARYL) 2 MG tablet TAKE ONE TABLET BY MOUTH ONCE DAILY BEFORE BREAKFAST (Patient taking differently: Take 2 mg by mouth daily with breakfast.)  meloxicam (MOBIC) 7.5 MG tablet Take 1 tablet (7.5 mg total) by mouth daily.   olmesartan-hydrochlorothiazide (BENICAR HCT) 40-25 MG tablet TAKE ONE TABLET BY MOUTH ONCE DAILY. (Patient taking differently: Take 1 tablet by mouth daily.)   senna-docusate (SENOKOT-S) 8.6-50 MG tablet Take 2 tablets by mouth at bedtime. For AFTER surgery, do not take if having diarrhea   traMADol (ULTRAM) 50 MG tablet Take 1 tablet (50 mg total) by mouth every 6 (six) hours as needed for severe pain. For AFTER surgery only, do not take and drive   MITIGARE 0.6 MG CAPS TAKE 2 TABLETS BY MOUTH TODAY. MAY TAKE 1 TABLET BY MOUTH IN 1 HOUR IF NO IMPROVEMENT. REPEAT DOSE FOR 3  DAYS   nitroGLYCERIN (NITROSTAT) 0.4 MG SL tablet Place 1 tablet (0.4 mg total) under the tongue every 5 (five) minutes as needed for chest pain.   [DISCONTINUED] amLODipine (NORVASC) 5 MG tablet Take 1 tablet (5 mg total) by mouth daily.   [DISCONTINUED] cyclobenzaprine (FLEXERIL) 5 MG tablet Take 1 tablet (5 mg total) by mouth at bedtime.   [DISCONTINUED] dextromethorphan-guaiFENesin (MUCINEX DM) 30-600 MG 12hr tablet Take 1 tablet by mouth 2 (two) times daily.   [DISCONTINUED] ergocalciferol (VITAMIN D2) 1.25 MG (50000 UT) capsule Vitamin D2 1,250 mcg (50,000 unit) capsule   [DISCONTINUED] fluticasone (FLONASE) 50 MCG/ACT nasal spray Place 2 sprays into both nostrils as needed.   [DISCONTINUED] predniSONE (STERAPRED UNI-PAK 21 TAB) 10 MG (21) TBPK tablet Take by mouth daily. Take 6 tabs by mouth daily  for 2 days, then 5 tabs for 2 days, then 4 tabs for 2 days, then 3 tabs for 2 days, 2 tabs for 2 days, then 1 tab by mouth daily for 2 days   [DISCONTINUED] rosuvastatin (CRESTOR) 20 MG tablet Take 0.5 tablets (10 mg total) by mouth daily.   [DISCONTINUED] traZODone (DESYREL) 100 MG tablet as needed.   No facility-administered encounter medications on file as of 10/22/2022.    ALLERGIES:  Allergies  Allergen Reactions   Lovastatin Other (See Comments)    REACTION: leg cramps   Metformin Nausea And Vomiting    REACTION: GI upset   Penicillins Other (See Comments)    REACTION: erythema multiforme 2001     FAMILY HISTORY:  Family History  Problem Relation Age of Onset   Heart disease Mother    Hypertension Mother    Cancer Brother        throat cancer, smoker   Cancer Brother    Breast cancer Granddaughter    Breast cancer Niece    Heart disease Other    Hypertension Other    Colon cancer Neg Hx    Colon polyps Neg Hx    Esophageal cancer Neg Hx    Stomach cancer Neg Hx    Rectal cancer Neg Hx      SOCIAL HISTORY:  Social Connections: Not on file    REVIEW OF SYSTEMS:   Denies appetite changes, fevers, chills, fatigue, unexplained weight changes. Denies hearing loss, neck lumps or masses, mouth sores, ringing in ears or voice changes. Denies cough or wheezing.  Denies shortness of breath. Denies chest pain or palpitations. Denies leg swelling. Denies abdominal distention, pain, blood in stools, constipation, diarrhea, nausea, vomiting, or early satiety. Denies pain with intercourse, dysuria, frequency, hematuria or incontinence. Denies hot flashes, pelvic pain, vaginal bleeding or vaginal discharge.   Denies joint pain, back pain or muscle pain/cramps. Denies itching, rash, or wounds. Denies dizziness, headaches, numbness or seizures.  Denies swollen lymph nodes or glands, denies easy bruising or bleeding. Denies anxiety, depression, confusion, or decreased concentration.  Physical Exam:  Vital Signs for this encounter:  Blood pressure 133/60, pulse 62, temperature 98.1 F (36.7 C), temperature source Oral, resp. rate 18, height 5\' 1"  (1.549 m), weight 174 lb (78.9 kg), SpO2 100%. Body mass index is 32.88 kg/m. General: Alert, oriented, no acute distress.  HEENT: Normocephalic, atraumatic. Sclera anicteric.  Chest: Clear to auscultation bilaterally. No wheezes, rhonchi, or rales. Cardiovascular: Regular rate and rhythm, no murmurs, rubs, or gallops.  Abdomen: Obese. Normoactive bowel sounds. Soft, nondistended, nontender to palpation. No masses or hepatosplenomegaly appreciated. No palpable fluid wave.  Extremities: Grossly normal range of motion. Warm, well perfused. No edema bilaterally.  Skin: No rashes or lesions.  Lymphatics: No cervical, supraclavicular, or inguinal adenopathy.  GU:  Normal external female genitalia. No lesions. No discharge or bleeding.             Bladder/urethra:  No lesions or masses, well supported bladder             Vagina: Mildly atrophic, no lesions.             Cervix: Normal appearing, no lesions.             Uterus:   Small, mobile, no parametrial involvement or nodularity.             Adnexa: No masses.  Rectal: Deferred.  LABORATORY AND RADIOLOGIC DATA:  Outside medical records were reviewed to synthesize the above history, along with the history and physical obtained during the visit.   Lab Results  Component Value Date   WBC 5.7 07/01/2018   HGB 12.1 07/01/2018   HCT 38.5 07/01/2018   PLT 214 07/01/2018   GLUCOSE 87 12/14/2019   CHOL 169 12/14/2019   TRIG 60 12/14/2019   HDL 56 12/14/2019   LDLDIRECT 136.0 09/02/2009   LDLCALC 101 (H) 12/14/2019   ALT 18 07/01/2018   AST 17 07/01/2018   NA 137 12/14/2019   K 4.1 12/14/2019   CL 100 12/14/2019   CREATININE 1.23 (H) 12/14/2019   BUN 25 12/14/2019   CO2 26 12/14/2019   TSH 1.24 11/14/2014   HGBA1C 6.4 (H) 12/14/2019   MICROALBUR <0.7 11/14/2014

## 2022-10-21 NOTE — Telephone Encounter (Signed)
Lvm for patient to call office regarding scheduling a new pt appointment either on 9/20 @ 11:15 or 9/23 @ 9:00am

## 2022-10-22 ENCOUNTER — Encounter: Payer: Self-pay | Admitting: Gynecologic Oncology

## 2022-10-22 ENCOUNTER — Inpatient Hospital Stay: Payer: Medicare HMO

## 2022-10-22 ENCOUNTER — Inpatient Hospital Stay: Payer: Medicare HMO | Attending: Gynecologic Oncology | Admitting: Gynecologic Oncology

## 2022-10-22 ENCOUNTER — Inpatient Hospital Stay: Payer: Medicare HMO | Admitting: Gynecologic Oncology

## 2022-10-22 VITALS — BP 133/60 | HR 62 | Temp 98.1°F | Resp 18 | Ht 61.0 in | Wt 174.0 lb

## 2022-10-22 DIAGNOSIS — I890 Lymphedema, not elsewhere classified: Secondary | ICD-10-CM | POA: Diagnosis not present

## 2022-10-22 DIAGNOSIS — F419 Anxiety disorder, unspecified: Secondary | ICD-10-CM | POA: Diagnosis not present

## 2022-10-22 DIAGNOSIS — M170 Bilateral primary osteoarthritis of knee: Secondary | ICD-10-CM

## 2022-10-22 DIAGNOSIS — C541 Malignant neoplasm of endometrium: Secondary | ICD-10-CM | POA: Diagnosis not present

## 2022-10-22 DIAGNOSIS — M159 Polyosteoarthritis, unspecified: Secondary | ICD-10-CM | POA: Diagnosis not present

## 2022-10-22 DIAGNOSIS — E1142 Type 2 diabetes mellitus with diabetic polyneuropathy: Secondary | ICD-10-CM | POA: Diagnosis not present

## 2022-10-22 DIAGNOSIS — Z79899 Other long term (current) drug therapy: Secondary | ICD-10-CM | POA: Insufficient documentation

## 2022-10-22 DIAGNOSIS — Z7984 Long term (current) use of oral hypoglycemic drugs: Secondary | ICD-10-CM | POA: Insufficient documentation

## 2022-10-22 DIAGNOSIS — N950 Postmenopausal bleeding: Secondary | ICD-10-CM | POA: Insufficient documentation

## 2022-10-22 DIAGNOSIS — E119 Type 2 diabetes mellitus without complications: Secondary | ICD-10-CM | POA: Diagnosis not present

## 2022-10-22 DIAGNOSIS — I10 Essential (primary) hypertension: Secondary | ICD-10-CM | POA: Insufficient documentation

## 2022-10-22 DIAGNOSIS — C55 Malignant neoplasm of uterus, part unspecified: Secondary | ICD-10-CM

## 2022-10-22 DIAGNOSIS — E0840 Diabetes mellitus due to underlying condition with diabetic neuropathy, unspecified: Secondary | ICD-10-CM

## 2022-10-22 DIAGNOSIS — M8580 Other specified disorders of bone density and structure, unspecified site: Secondary | ICD-10-CM | POA: Diagnosis not present

## 2022-10-22 DIAGNOSIS — Z7952 Long term (current) use of systemic steroids: Secondary | ICD-10-CM | POA: Diagnosis not present

## 2022-10-22 DIAGNOSIS — Z803 Family history of malignant neoplasm of breast: Secondary | ICD-10-CM | POA: Diagnosis not present

## 2022-10-22 DIAGNOSIS — Z791 Long term (current) use of non-steroidal anti-inflammatories (NSAID): Secondary | ICD-10-CM | POA: Insufficient documentation

## 2022-10-22 DIAGNOSIS — K219 Gastro-esophageal reflux disease without esophagitis: Secondary | ICD-10-CM | POA: Insufficient documentation

## 2022-10-22 DIAGNOSIS — Z808 Family history of malignant neoplasm of other organs or systems: Secondary | ICD-10-CM | POA: Insufficient documentation

## 2022-10-22 DIAGNOSIS — Z7982 Long term (current) use of aspirin: Secondary | ICD-10-CM | POA: Diagnosis not present

## 2022-10-22 DIAGNOSIS — E785 Hyperlipidemia, unspecified: Secondary | ICD-10-CM | POA: Insufficient documentation

## 2022-10-22 DIAGNOSIS — J309 Allergic rhinitis, unspecified: Secondary | ICD-10-CM | POA: Insufficient documentation

## 2022-10-22 DIAGNOSIS — M109 Gout, unspecified: Secondary | ICD-10-CM | POA: Insufficient documentation

## 2022-10-22 MED ORDER — TRAMADOL HCL 50 MG PO TABS: 50.0000 mg | ORAL_TABLET | Freq: Four times a day (QID) | ORAL | 0 refills | Status: DC | PRN

## 2022-10-22 MED ORDER — SENNOSIDES-DOCUSATE SODIUM 8.6-50 MG PO TABS: 2.0000 | ORAL_TABLET | Freq: Every day | ORAL | 0 refills | Status: DC

## 2022-10-22 NOTE — Patient Instructions (Addendum)
Preparing for your Surgery  Plan for surgery on November 03, 2022 with Dr. Eugene Garnet at [facility]. You will be scheduled for robotic assisted total laparoscopic hysterectomy (removal of the uterus and cervix), bilateral salpingo-oophorectomy (removal of both ovaries and fallopian tubes), sentinel lymph node biopsy, possible lymph node dissection, possible laparotomy (larger incision on your abdomen if needed), omentectomy.   Pre-operative Testing -You will receive a phone call from presurgical testing at [facility] to arrange for a pre-operative appointment and lab work.  -Bring your insurance card, copy of an advanced directive if applicable, medication list  -At that visit, you will be asked to sign a consent for a possible blood transfusion in case a transfusion becomes necessary during surgery.  The need for a blood transfusion is rare but having consent is a necessary part of your care.     -You can continue taking your baby aspirin 81 mg with the LAST DOSE BEING THE DAY BEFORE SURGERY. DO NOT TAKE THE MORNING OF SURGERY.  -Do not take supplements such as fish oil (omega 3), red yeast rice, turmeric before your surgery. You want to avoid medications with aspirin in them including headache powders such as BC or Goody's), Excedrin migraine. STOP FISH OIL NOW. YOU WILL NEED TO STOP TAKING MOBIC (MELOXICAM) 7 DAYS BEFORE SURGERY DUE TO BLEEDING RISK.  Day Before Surgery at Home -You will be asked to take in a light diet the day before surgery. You will be advised you can have clear liquids up until 3 hours before your surgery.    Eat a light diet the day before surgery.  Examples including soups, broths, toast, yogurt, mashed potatoes.  AVOID GAS PRODUCING FOODS AND BEVERAGES. Things to avoid include carbonated beverages (fizzy beverages, sodas), raw fruits and raw vegetables (uncooked), or beans.   If your bowels are filled with gas, your surgeon will have difficulty  visualizing your pelvic organs which increases your surgical risks.  Your role in recovery Your role is to become active as soon as directed by your doctor, while still giving yourself time to heal.  Rest when you feel tired. You will be asked to do the following in order to speed your recovery:  - Cough and breathe deeply. This helps to clear and expand your lungs and can prevent pneumonia after surgery.  - STAY ACTIVE WHEN YOU GET HOME. Do mild physical activity. Walking or moving your legs help your circulation and body functions return to normal. Do not try to get up or walk alone the first time after surgery.   -If you develop swelling on one leg or the other, pain in the back of your leg, redness/warmth in one of your legs, please call the office or go to the Emergency Room to have a doppler to rule out a blood clot. For shortness of breath, chest pain-seek care in the Emergency Room as soon as possible. - Actively manage your pain. Managing your pain lets you move in comfort. We will ask you to rate your pain on a scale of zero to 10. It is your responsibility to tell your doctor or nurse where and how much you hurt so your pain can be treated.  Special Considerations -If you are diabetic, you may be placed on insulin after surgery to have closer control over your blood sugars to promote healing and recovery.  This does not mean that you will be discharged on insulin.  If applicable, your oral antidiabetics will be  resumed when you are tolerating a solid diet.  -Your final pathology results from surgery should be available around one week after surgery and the results will be relayed to you when available.  -Dr. Antionette Char is the surgeon that assists your GYN Oncologist with surgery.  If you end up staying the night, the next day after your surgery you will either see Dr. Pricilla Holm, Dr. Alvester Morin, or Dr. Antionette Char.  -FMLA forms can be faxed to [phone] and please allow 5-7  business days for completion.  Pain Management After Surgery -You have been prescribed your pain medication and bowel regimen medications before surgery so that you can have these available when you are discharged from the hospital. The pain medication is for use ONLY AFTER surgery and a new prescription will not be given.   -Make sure that you have Tylenol and Ibuprofen (OR MOBIC, DO NOT TAKE TOGETHER) IF YOU ARE ABLE TO TAKE THESE MEDICATIONS at home to use on a regular basis after surgery for pain control. We recommend alternating the medications every hour to six hours since they work differently and are processed in the body differently for pain relief.  -Review the attached handout on narcotic use and their risks and side effects.   Bowel Regimen -You have been prescribed Sennakot-S to take nightly to prevent constipation especially if you are taking the narcotic pain medication intermittently.  It is important to prevent constipation and drink adequate amounts of liquids. You can stop taking this medication when you are not taking pain medication and you are back on your normal bowel routine.  Risks of Surgery Risks of surgery are low but include bleeding, infection, damage to surrounding structures, re-operation, blood clots, and very rarely death.   Blood Transfusion Information (For the consent to be signed before surgery)  We will be checking your blood type before surgery so in case of emergencies, we will know what type of blood you would need.                                            WHAT IS A BLOOD TRANSFUSION?  A transfusion is the replacement of blood or some of its parts. Blood is made up of multiple cells which provide different functions. Red blood cells carry oxygen and are used for blood loss replacement. White blood cells fight against infection. Platelets control bleeding. Plasma helps clot blood. Other blood products are available for specialized needs, such as  hemophilia or other clotting disorders. BEFORE THE TRANSFUSION  Who gives blood for transfusions?  You may be able to donate blood to be used at a later date on yourself (autologous donation). Relatives can be asked to donate blood. This is generally not any safer than if you have received blood from a stranger. The same precautions are taken to ensure safety when a relative's blood is donated. Healthy volunteers who are fully evaluated to make sure their blood is safe. This is blood bank blood. Transfusion therapy is the safest it has ever been in the practice of medicine. Before blood is taken from a donor, a complete history is taken to make sure that person has no history of diseases nor engages in risky social behavior (examples are intravenous drug use or sexual activity with multiple partners). The donor's travel history is screened to minimize risk of transmitting infections, such as malaria. The  donated blood is tested for signs of infectious diseases, such as HIV and hepatitis. The blood is then tested to be sure it is compatible with you in order to minimize the chance of a transfusion reaction. If you or a relative donates blood, this is often done in anticipation of surgery and is not appropriate for emergency situations. It takes many days to process the donated blood. RISKS AND COMPLICATIONS Although transfusion therapy is very safe and saves many lives, the main dangers of transfusion include:  Getting an infectious disease. Developing a transfusion reaction. This is an allergic reaction to something in the blood you were given. Every precaution is taken to prevent this. The decision to have a blood transfusion has been considered carefully by your caregiver before blood is given. Blood is not given unless the benefits outweigh the risks.  AFTER SURGERY INSTRUCTIONS  Return to work: 4-6 weeks if applicable  Activity: 1. Be up and out of the bed during the day.  Take a nap if needed.   You may walk up steps but be careful and use the hand rail.  Stair climbing will tire you more than you think, you may need to stop part way and rest.   2. No lifting or straining for 6 weeks over 10 pounds. No pushing, pulling, straining for 6 weeks.  3. No driving for around 1 week(s).  Do not drive if you are taking narcotic pain medicine and make sure that your reaction time has returned.   4. You can shower as soon as the next day after surgery. Shower daily.  Use your regular soap and water (not directly on the incision) and pat your incision(s) dry afterwards; don't rub.  No tub baths or submerging your body in water until cleared by your surgeon. If you have the soap that was given to you by pre-surgical testing that was used before surgery, you do not need to use it afterwards because this can irritate your incisions.   5. No sexual activity and nothing in the vagina for 8-10 weeks.  6. You may experience a small amount of clear drainage from your incisions, which is normal.  If the drainage persists, increases, or changes color please call the office.  7. Do not use creams, lotions, or ointments such as neosporin on your incisions after surgery until advised by your surgeon because they can cause removal of the dermabond glue on your incisions.    8. You may experience vaginal spotting after surgery or when the stitches at the top of the vagina begin to dissolve.  The spotting is normal but if you experience heavy bleeding, call our office.  9. Take Tylenol or ibuprofen (OR MOBIC, DO NOT TAKE TOGETHER) first for pain if you are able to take these medications and only use narcotic pain medication for severe pain not relieved by the Tylenol or Ibuprofen.  Monitor your Tylenol intake to a max of 4,000 mg in a 24 hour period.   Diet: 1. Low sodium Heart Healthy Diet is recommended but you are cleared to resume your normal (before surgery) diet after your procedure.  2. It is safe to use a  laxative, such as Miralax or Colace, if you have difficulty moving your bowels. You have been prescribed Sennakot-S to take at bedtime every evening after surgery to keep bowel movements regular and to prevent constipation.    Wound Care: 1. Keep clean and dry.  Shower daily.  Reasons to call the Doctor: Fever -  Oral temperature greater than 100.4 degrees Fahrenheit Foul-smelling vaginal discharge Difficulty urinating Nausea and vomiting Increased pain at the site of the incision that is unrelieved with pain medicine. Difficulty breathing with or without chest pain New calf pain especially if only on one side Sudden, continuing increased vaginal bleeding with or without clots.   Contacts: For questions or concerns you should contact:  Dr. Eugene Garnet at [phone]  Warner Mccreedy, NP at [phone]  After Hours: call [phone] and have the GYN Oncologist paged/contacted (after 5 pm or on the weekends). You will speak with an after hours RN and let he or she know you have had surgery.  Messages sent via mychart are for non-urgent matters and are not responded to after hours so for urgent needs, please call the after hours number.

## 2022-10-22 NOTE — Progress Notes (Signed)
Patient here for a pre-operative appointment prior to her scheduled surgery on 11/03/2022. She is scheduled for a robotic assisted total laparoscopic hysterectomy, bilateral salpingo-oophorectomy, sentinel lymph node biopsy, possible lymph node dissection, possible laparotomy, omentectomy. The surgery was discussed in detail.  See after visit summary for additional details. Visual aids used to discuss items related to surgery including the incentive spirometer, sequential compression stockings, foley catheter, IV pump, multi-modal pain regimen including tylenol, photo of the surgical robot, female reproductive system to discuss surgery in detail.      Discussed post-op pain management in detail including the aspects of the enhanced recovery pathway.  Advised her that a new prescription would be sent in for Tramadol and it is only to be used for after her upcoming surgery.  We discussed the use of tylenol post-op and to monitor for a maximum of 4,000 mg in a 24 hour period.  Also prescribed sennakot to be used after surgery and to hold if having loose stools.  Discussed bowel regimen in detail.     Discussed the use of SCDs and measures to take at home to prevent DVT including frequent mobility.  Reportable signs and symptoms of DVT discussed. Post-operative instructions discussed and expectations for after surgery. Incisional care discussed as well including reportable signs and symptoms including erythema, drainage, wound separation.     30 minutes spent with the patient.  Verbalizing understanding of material discussed. No needs or concerns voiced at the end of the visit.   Advised patient to call for any needs.  Advised that her post-operative medications had been prescribed and could be picked up at any time.    This appointment is included in the global surgical bundle as pre-operative teaching and has no charge.

## 2022-10-23 LAB — CA 125: Cancer Antigen (CA) 125: 17.4 U/mL (ref 0.0–38.1)

## 2022-10-27 ENCOUNTER — Telehealth: Payer: Self-pay | Admitting: *Deleted

## 2022-10-27 NOTE — Telephone Encounter (Signed)
Per Dr Pricilla Holm fax records and surgical optimization form to PCP office

## 2022-10-28 ENCOUNTER — Ambulatory Visit (HOSPITAL_COMMUNITY)
Admission: RE | Admit: 2022-10-28 | Discharge: 2022-10-28 | Disposition: A | Discharge status: Home / Self Care | Payer: Medicare HMO | Source: Ambulatory Visit | Attending: Gynecologic Oncology | Admitting: Gynecologic Oncology

## 2022-10-28 ENCOUNTER — Telehealth: Payer: Self-pay | Admitting: *Deleted

## 2022-10-28 DIAGNOSIS — C541 Malignant neoplasm of endometrium: Secondary | ICD-10-CM | POA: Insufficient documentation

## 2022-10-28 LAB — POCT I-STAT CREATININE: Creatinine, Ser: 1.3 mg/dL — ABNORMAL HIGH (ref 0.44–1.00)

## 2022-10-28 MED ORDER — IOHEXOL 300 MG/ML  SOLN
100.0000 mL | Freq: Once | INTRAMUSCULAR | Status: AC | PRN
  Administered 2022-10-28: 100 mL via INTRAVENOUS

## 2022-10-28 MED ORDER — IOHEXOL 9 MG/ML PO SOLN
1000.0000 mL | Freq: Once | ORAL | Status: AC
  Administered 2022-10-28: 1000 mL via ORAL

## 2022-10-28 NOTE — Telephone Encounter (Signed)
-----   Message from Carver Fila sent at 10/28/2022 11:25 AM EDT ----- Could you please call the patient and let her know tumor marker from last week is normal? Thank you

## 2022-10-28 NOTE — Patient Instructions (Signed)
DUE TO COVID-19 ONLY TWO VISITORS  (aged 70 and older)  ARE ALLOWED TO COME WITH YOU AND STAY IN THE WAITING ROOM ONLY DURING PRE OP AND PROCEDURE.   **NO VISITORS ARE ALLOWED IN THE SHORT STAY AREA OR RECOVERY ROOM!!**  IF YOU WILL BE ADMITTED INTO THE HOSPITAL YOU ARE ALLOWED ONLY FOUR SUPPORT PEOPLE DURING VISITATION HOURS ONLY (7 AM -8PM)   The support person(s) must pass our screening, gel in and out, and wear a mask at all times, including in the patient's room. Patients must also wear a mask when staff or their support person are in the room. Visitors GUEST BADGE MUST BE WORN VISIBLY  One adult visitor may remain with you overnight and MUST be in the room by 8 P.M.     Your procedure is scheduled on: 11/03/22   Report to [facility] Main Entrance    Report to admitting at : 1:15 PM   Call this number if you have problems the morning of surgery [phone]   Eat a light diet the day before surgery.  Examples including soups, broths, toast, yogurt, mashed potatoes.  Things to avoid include carbonated beverages (fizzy beverages), raw fruits and raw vegetables, or beans.   If your bowels are filled with gas, your surgeon will have difficulty visualizing your pelvic organs which increases your surgical risks.  Do not eat food :After Midnight.   After Midnight you may have the following liquids until : 12:30 PM DAY OF SURGERY  Water Black Coffee (sugar ok, NO MILK/CREAM OR CREAMERS)  Tea (sugar ok, NO MILK/CREAM OR CREAMERS) regular and decaf                             Plain Jell-O (NO RED)                                           Fruit ices (not with fruit pulp, NO RED)                                     Popsicles (NO RED)                                                                  Juice: apple, WHITE grape, WHITE cranberry Sports drinks like Gatorade (NO RED)             FOLLOW ANY ADDITIONAL PRE OP INSTRUCTIONS YOU RECEIVED FROM YOUR SURGEON'S OFFICE!!!    Oral Hygiene is also important to reduce your risk of infection.                                    Remember - BRUSH YOUR TEETH THE MORNING OF SURGERY WITH YOUR REGULAR TOOTHPASTE  DENTURES WILL BE REMOVED PRIOR TO SURGERY PLEASE DO NOT APPLY "Poly grip" OR ADHESIVES!!!   Do NOT smoke after Midnight   Take these medicines the morning of surgery with A SIP OF WATER:  allopurinol.Colchicine as needed.  DO NOT TAKE ANY ORAL DIABETIC MEDICATIONS DAY OF YOUR SURGERY                              You may not have any metal on your body including hair pins, jewelry, and body piercing             Do not wear make-up, lotions, powders, perfumes/cologne, or deodorant  Do not wear nail polish including gel and S&S, artificial/acrylic nails, or any other type of covering on natural nails including finger and toenails. If you have artificial nails, gel coating, etc. that needs to be removed by a nail salon please have this removed prior to surgery or surgery may need to be canceled/ delayed if the surgeon/ anesthesia feels like they are unable to be safely monitored.   Do not shave  48 hours prior to surgery.    Do not bring valuables to the hospital. [facility] IS NOT             RESPONSIBLE   FOR VALUABLES.   Contacts, glasses, or bridgework may not be worn into surgery.   Bring small overnight bag day of surgery.   DO NOT BRING YOUR HOME MEDICATIONS TO THE HOSPITAL. PHARMACY WILL DISPENSE MEDICATIONS LISTED ON YOUR MEDICATION LIST TO YOU DURING YOUR ADMISSION IN THE HOSPITAL!    Patients discharged on the day of surgery will not be allowed to drive home.  Someone NEEDS to stay with you for the first 24 hours after anesthesia.   Special Instructions: Bring a copy of your healthcare power of attorney and living will documents         the day of surgery if you haven't scanned them before.              Please read over the following fact sheets you were given: IF YOU HAVE QUESTIONS ABOUT YOUR  PRE-OP INSTRUCTIONS PLEASE CALL [phone]    [facility] - Preparing for Surgery Before surgery, you can play an important role.  Because skin is not sterile, your skin needs to be as free of germs as possible.  You can reduce the number of germs on your skin by washing with CHG (chlorahexidine gluconate) soap before surgery.  CHG is an antiseptic cleaner which kills germs and bonds with the skin to continue killing germs even after washing. Please DO NOT use if you have an allergy to CHG or antibacterial soaps.  If your skin becomes reddened/irritated stop using the CHG and inform your nurse when you arrive at Short Stay. Do not shave (including legs and underarms) for at least 48 hours prior to the first CHG shower.  You may shave your face/neck. Please follow these instructions carefully:  1.  Shower with CHG Soap the night before surgery and the  morning of Surgery.  2.  If you choose to wash your hair, wash your hair first as usual with your  normal  shampoo.  3.  After you shampoo, rinse your hair and body thoroughly to remove the  shampoo.                           4.  Use CHG as you would any other liquid soap.  You can apply chg directly  to the skin and wash  Gently with a scrungie or clean washcloth.  5.  Apply the CHG Soap to your body ONLY FROM THE NECK DOWN.   Do not use on face/ open                           Wound or open sores. Avoid contact with eyes, ears mouth and genitals (private parts).                       Wash face,  Genitals (private parts) with your normal soap.             6.  Wash thoroughly, paying special attention to the area where your surgery  will be performed.  7.  Thoroughly rinse your body with warm water from the neck down.  8.  DO NOT shower/wash with your normal soap after using and rinsing off  the CHG Soap.                9.  Pat yourself dry with a clean towel.            10.  Wear clean pajamas.            11.  Place clean  sheets on your bed the night of your first shower and do not  sleep with pets. Day of Surgery : Do not apply any lotions/deodorants the morning of surgery.  Please wear clean clothes to the hospital/surgery center.  FAILURE TO FOLLOW THESE INSTRUCTIONS MAY RESULT IN THE CANCELLATION OF YOUR SURGERY PATIENT SIGNATURE_________________________________  NURSE SIGNATURE__________________________________  ________________________________________________________________________ WHAT IS A BLOOD TRANSFUSION? Blood Transfusion Information  A transfusion is the replacement of blood or some of its parts. Blood is made up of multiple cells which provide different functions. Red blood cells carry oxygen and are used for blood loss replacement. White blood cells fight against infection. Platelets control bleeding. Plasma helps clot blood. Other blood products are available for specialized needs, such as hemophilia or other clotting disorders. BEFORE THE TRANSFUSION  Who gives blood for transfusions?  Healthy volunteers who are fully evaluated to make sure their blood is safe. This is blood bank blood. Transfusion therapy is the safest it has ever been in the practice of medicine. Before blood is taken from a donor, a complete history is taken to make sure that person has no history of diseases nor engages in risky social behavior (examples are intravenous drug use or sexual activity with multiple partners). The donor's travel history is screened to minimize risk of transmitting infections, such as malaria. The donated blood is tested for signs of infectious diseases, such as HIV and hepatitis. The blood is then tested to be sure it is compatible with you in order to minimize the chance of a transfusion reaction. If you or a relative donates blood, this is often done in anticipation of surgery and is not appropriate for emergency situations. It takes many days to process the donated blood. RISKS AND  COMPLICATIONS Although transfusion therapy is very safe and saves many lives, the main dangers of transfusion include:  Getting an infectious disease. Developing a transfusion reaction. This is an allergic reaction to something in the blood you were given. Every precaution is taken to prevent this. The decision to have a blood transfusion has been considered carefully by your caregiver before blood is given. Blood is not given unless the benefits outweigh the risks. AFTER THE TRANSFUSION Right after receiving  a blood transfusion, you will usually feel much better and more energetic. This is especially true if your red blood cells have gotten low (anemic). The transfusion raises the level of the red blood cells which carry oxygen, and this usually causes an energy increase. The nurse administering the transfusion will monitor you carefully for complications. HOME CARE INSTRUCTIONS  No special instructions are needed after a transfusion. You may find your energy is better. Speak with your caregiver about any limitations on activity for underlying diseases you may have. SEEK MEDICAL CARE IF:  Your condition is not improving after your transfusion. You develop redness or irritation at the intravenous (IV) site. SEEK IMMEDIATE MEDICAL CARE IF:  Any of the following symptoms occur over the next 12 hours: Shaking chills. You have a temperature by mouth above 102 F (38.9 C), not controlled by medicine. Chest, back, or muscle pain. People around you feel you are not acting correctly or are confused. Shortness of breath or difficulty breathing. Dizziness and fainting. You get a rash or develop hives. You have a decrease in urine output. Your urine turns a dark color or changes to pink, red, or brown. Any of the following symptoms occur over the next 10 days: You have a temperature by mouth above 102 F (38.9 C), not controlled by medicine. Shortness of breath. Weakness after normal activity. The  white part of the eye turns yellow (jaundice). You have a decrease in the amount of urine or are urinating less often. Your urine turns a dark color or changes to pink, red, or brown. Document Released: 01/16/2000 Document Revised: 04/12/2011 Document Reviewed: 09/04/2007 [facility] Patient Information 2014 [place], [place].  _______________________________________________________________________

## 2022-10-28 NOTE — Telephone Encounter (Signed)
Attempted to reach Alice Hickman in regards to lab results. Left voicemail requesting call back.   Patient's daughter Bellanie Assmann called back and message relayed from Dr.Tucker that patient's tumor marker from last week is normal. Summer thanked the office for calling.

## 2022-10-29 ENCOUNTER — Encounter (HOSPITAL_COMMUNITY): Payer: Self-pay

## 2022-10-29 ENCOUNTER — Other Ambulatory Visit: Payer: Self-pay

## 2022-10-29 ENCOUNTER — Encounter (HOSPITAL_COMMUNITY)
Admission: RE | Admit: 2022-10-29 | Discharge: 2022-10-29 | Disposition: A | Discharge status: Home / Self Care | Payer: Medicare HMO | Source: Ambulatory Visit | Attending: Gynecologic Oncology

## 2022-10-29 VITALS — BP 140/77 | HR 81 | Temp 98.2°F | Ht 61.0 in | Wt 174.0 lb

## 2022-10-29 DIAGNOSIS — I10 Essential (primary) hypertension: Secondary | ICD-10-CM | POA: Diagnosis not present

## 2022-10-29 DIAGNOSIS — C541 Malignant neoplasm of endometrium: Secondary | ICD-10-CM | POA: Diagnosis not present

## 2022-10-29 DIAGNOSIS — E785 Hyperlipidemia, unspecified: Secondary | ICD-10-CM | POA: Diagnosis not present

## 2022-10-29 DIAGNOSIS — Z01818 Encounter for other preprocedural examination: Secondary | ICD-10-CM | POA: Diagnosis present

## 2022-10-29 DIAGNOSIS — E1140 Type 2 diabetes mellitus with diabetic neuropathy, unspecified: Secondary | ICD-10-CM | POA: Diagnosis not present

## 2022-10-29 HISTORY — DX: Depression, unspecified: F32.A

## 2022-10-29 HISTORY — DX: Malignant (primary) neoplasm, unspecified: C80.1

## 2022-10-29 HISTORY — DX: Anemia, unspecified: D64.9

## 2022-10-29 LAB — COMPREHENSIVE METABOLIC PANEL
ALT: 14 U/L (ref 0–44)
AST: 16 U/L (ref 15–41)
Albumin: 3.6 g/dL (ref 3.5–5.0)
Alkaline Phosphatase: 43 U/L (ref 38–126)
Anion gap: 11 (ref 5–15)
BUN: 20 mg/dL (ref 8–23)
CO2: 27 mmol/L (ref 22–32)
Calcium: 9.5 mg/dL (ref 8.9–10.3)
Chloride: 102 mmol/L (ref 98–111)
Creatinine, Ser: 1.07 mg/dL — ABNORMAL HIGH (ref 0.44–1.00)
GFR, Estimated: 56 mL/min — ABNORMAL LOW (ref >60)
Glucose, Bld: 131 mg/dL — ABNORMAL HIGH (ref 70–99)
Potassium: 4.1 mmol/L (ref 3.5–5.1)
Sodium: 140 mmol/L (ref 135–145)
Total Bilirubin: 0.8 mg/dL (ref 0.3–1.2)
Total Protein: 7.2 g/dL (ref 6.5–8.1)

## 2022-10-29 LAB — CBC
HCT: 37.4 % (ref 36.0–46.0)
Hemoglobin: 11.5 g/dL — ABNORMAL LOW (ref 12.0–15.0)
MCH: 27.8 pg (ref 26.0–34.0)
MCHC: 30.7 g/dL (ref 30.0–36.0)
MCV: 90.6 fL (ref 80.0–100.0)
Platelets: 246 10*3/uL (ref 150–400)
RBC: 4.13 MIL/uL (ref 3.87–5.11)
RDW: 13.5 % (ref 11.5–15.5)
WBC: 4.4 10*3/uL (ref 4.0–10.5)
nRBC: 0 % (ref 0.0–0.2)

## 2022-10-29 LAB — GLUCOSE, CAPILLARY: Glucose-Capillary: 120 mg/dL — ABNORMAL HIGH (ref 70–99)

## 2022-10-29 LAB — HEMOGLOBIN A1C
Hgb A1c MFr Bld: 5.7 % — ABNORMAL HIGH (ref 4.8–5.6)
Mean Plasma Glucose: 116.89 mg/dL

## 2022-10-29 NOTE — Progress Notes (Addendum)
For Short Stay: COVID SWAB appointment date:  Bowel Prep reminder:   For Anesthesia: PCP - Maryagnes Amos, FNP Cardiologist -  Elder Negus, MD   Chest x-ray - EKG - 10/29/22 Stress Test - 08/16/18 ECHO - 09/04/18 Cardiac Cath -  Pacemaker/ICD device last checked: Pacemaker orders received: Device Rep notified:  Spinal Cord Stimulator: N/A  Sleep Study - N/A CPAP -   Fasting Blood Sugar - N/A Checks Blood Sugar __0___ times a day Date and result of last Hgb A1c-  Last dose of GLP1 agonist- N/A GLP1 instructions:   Last dose of SGLT-2 inhibitors- N/A SGLT-2 instructions:   Blood Thinner Instructions: Aspirin Instructions: Pt. Is not taking it. Last Dose:  Activity level: Can go up a flight of stairs and activities of daily living without stopping and without chest pain and/or shortness of breath   Able to exercise without chest pain and/or shortness of breath  Anesthesia review: Hx: HTN,DIA,SOB(resolved),Abnormal stress test.  Patient denies shortness of breath, fever, cough and chest pain at PAT appointment   Patient verbalized understanding of instructions that were given to them at the PAT appointment. Patient was also instructed that they will need to review over the PAT instructions again at home before surgery.

## 2022-11-01 ENCOUNTER — Telehealth: Payer: Self-pay | Admitting: *Deleted

## 2022-11-01 ENCOUNTER — Telehealth: Payer: Self-pay | Admitting: Cardiology

## 2022-11-01 NOTE — Telephone Encounter (Signed)
Name: LENAYAH LANTZER  DOB: 1951-01-18  MRN: [id number]  Primary Cardiologist: None  Chart reviewed as part of pre-operative protocol coverage. Because of Mechelle Vanharen Hillmer's past medical history and time since last visit, she will require a follow-up in-office visit in order to better assess preoperative cardiovascular risk.  Pre-op covering staff: - Please schedule appointment and call patient to inform them. If patient already had an upcoming appointment within acceptable timeframe, please add "pre-op clearance" to the appointment notes so provider is aware. - Please contact requesting surgeon's office via preferred method (i.e, phone, fax) to inform them of need for appointment prior to surgery.   Napoleon Form, Leodis Rains, NP  11/01/2022, 10:30 AM

## 2022-11-01 NOTE — Telephone Encounter (Signed)
Left message for the pt to call back to schedule an IN OFFICE appt for pre op clearance. I will also update the requesting office the pt needs in office appt, and we will do our best to help accommodate. However, our office just received the request today for a 11/04/22 procedure.

## 2022-11-01 NOTE — Progress Notes (Addendum)
Anesthesia Chart Review   Case: [id number] Date/Time: 11/03/22 1515   Procedures:      XI ROBOTIC ASSISTED TOTAL HYSTERECTOMY BILATERAL SALPINGO OOPHORECTOMY WITH OMENTECTOMY     SENTINEL NODE BIOPSY     POSSIBLE LYMPH NODE DISSECTION AND POSSIBLE LAPAROTOMY   Anesthesia type: General   Pre-op diagnosis: ENDOMETRIAL CANCER   Location: [facility] ROOM 05 / [facility]   Surgeons: Carver Fila, MD       DISCUSSION:72 y.o. never smoker with h/o DM II, HTN, endometrial cancer scheduled for above procedure 11/03/2022 with Dr. Eugene Garnet.   Pt last seen by cardiology 08/12/2021. Per OV note, "While ischemia cannot be excluded, large part of stress test abnormality likely due to tissue attenuation. She is not having any chest pain, shortness of breath symptoms at this time. Continue current medical management for hypertension, hyperlipidemia." 1 year follow up recommended, it looks like she has not been seen since 2023.  Will request cardiac clearance.   Addendum 11/11/2022:  Pt seen by cardiology 11/04/2022. Per OV note, "Prior abnormal stress test in 2020 most likely due to tissue attenuation. No cardiac events in last 4 years. No symptoms of angina, dyspnea with >4 METS functional capacity. Moderate MR on echocardiogram in 2020, with no significant murmur or symptoms suggestive of any higher severity. Based on 2024 [facility] perioperative guidelines, I believe her perioperative risk is acceptable and unlikely to require any further cardiac testing. 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 (all labs ordered are listed, but only abnormal results are displayed)  Labs Reviewed  HEMOGLOBIN A1C -  Abnormal; Notable for the following components:      Result Value   Hgb A1c MFr Bld 5.7 (*)    All other components within normal limits  CBC - Abnormal; Notable for the following components:   Hemoglobin 11.5 (*)    All other components within normal limits  COMPREHENSIVE METABOLIC PANEL - Abnormal; Notable for the following components:   Glucose, Bld 131 (*)    Creatinine, Ser 1.07 (*)    GFR, Estimated 56 (*)    All other components within normal limits  GLUCOSE, CAPILLARY - Abnormal; Notable for the following components:   Glucose-Capillary 120 (*)    All other components within normal limits  TYPE AND SCREEN     IMAGES:   EKG:   CV: Lexiscan Myoview stress test 08/16/2018:  Lexiscan stress test was performed. Stress EKG is non-diagnostic, as this is pharmacological stress test. SPECT images reveal large area of moderate decrease in tracer uptake in mid to basal inferior, inferoseptal and inferolateral myocardium, more prominent on stress than rest images. While breast tissue attenuation could be responsible, ischemia in this area cannot be excluded.  Intermediate risk study.   Echocardiogram 09/04/2018:  Normal LV systolic function with EF 55%. Left ventricle cavity is normal  in size. Mild concentric hypertrophy of the left ventricle. Normal global  wall motion. Doppler evidence of grade I (impaired) diastolic dysfunction,  normal LAP. Calculated EF 55%.  Left atrial cavity is mildly dilated.  Right atrial cavity is mildly dilated.  Moderate (Grade II) mitral  regurgitation.  Mild tricuspid regurgitation.  No evidence of pulmonary hypertension.  Past Medical History:  Diagnosis Date   ALLERGIC RHINITIS 05/01/2007   Allergy    Anemia    ANKLE PAIN, RIGHT 09/02/2009   ANXIETY, SITUATIONAL 09/02/2009   Cancer (HCC)    CARPAL TUNNEL SYNDROME, RIGHT 11/15/2006   Depression    DM w/o Complication Type II 11/15/2006   Generalized osteoarthritis 10/27/2012   bilat  ankles, knees, hands and wrists   GERD 11/15/2006   GLAUCOMA 07/04/2008   Gout 11/14/2014   HYPERLIPIDEMIA 05/01/2007   HYPERTENSION 11/15/2006   HYPOKALEMIA 10/02/2008   Low back pain    OSTEOPENIA 09/02/2009   Peripheral neuropathy 11/14/2014   THUMB PAIN, RIGHT 04/02/2009    Past Surgical History:  Procedure Laterality Date   COLONOSCOPY     NO PAST SURGERIES      MEDICATIONS:  allopurinol (ZYLOPRIM) 100 MG tablet   aspirin 81 MG chewable tablet   Colchicine 0.6 MG CAPS   ferrous sulfate 325 (65 FE) MG tablet   furosemide (LASIX) 20 MG tablet   glimepiride (AMARYL) 2 MG tablet   latanoprost (XALATAN) 0.005 % ophthalmic solution   meloxicam (MOBIC) 7.5 MG tablet   nitroGLYCERIN (NITROSTAT) 0.4 MG SL tablet   olmesartan-hydrochlorothiazide (BENICAR HCT) 40-25 MG tablet   senna-docusate (SENOKOT-S) 8.6-50 MG tablet   traMADol (ULTRAM) 50 MG tablet   No current facility-administered medications for this encounter.     Jodell Cipro Ward, PA-C [facility] [phone]

## 2022-11-01 NOTE — Telephone Encounter (Signed)
Hi all, Looks like we may need to change this patient's surgery date given need for in person visit with cardiologist for surgical clearance.

## 2022-11-01 NOTE — Telephone Encounter (Signed)
Spoke with Pt's daughter [employer]. Relayed message from providers that patient's surgery date has changed to October 17 th. Due to wait on clearance from cardiologist. Advised pt's daughter that the office would call once we have a time set for her mother's surgery.

## 2022-11-01 NOTE — Telephone Encounter (Signed)
Pre-operative Risk Assessment    Patient Name: Alice Hickman  DOB: 09-23-1950 MRN: [id number]      Request for Surgical Clearance    Procedure:   Robotic assisted total hysterectomy with a possible biopsy, omenectomy.  A SNL biopsy with possible LND  Date of Surgery:  Clearance 11/04/22                                 Surgeon:    Dr. Eugene Garnet Surgeon's Group or Practice Name: [facility]  Phone number:  [phone]  Fax number:  [phone]   Type of Clearance Requested:   - Medical  - Pharmacy   Type of Anesthesia:  General    Additional requests/questions:   Caller [name]) stated patient will need urgent medical and pharmacy clearance.  Signed, Annetta Maw   11/01/2022, 10:23 AM

## 2022-11-01 NOTE — Telephone Encounter (Signed)
Pt has called back and she has been scheduled to see Dr. Rosemary Holms 11/04/22 for pre op clearance. I will update all parties involved.   Requesting office stated anesthesiologist just notified the surgeon office they will need to get cardiac clearance.

## 2022-11-03 DIAGNOSIS — C541 Malignant neoplasm of endometrium: Secondary | ICD-10-CM

## 2022-11-03 LAB — TYPE AND SCREEN
ABO/RH(D): O POS
Antibody Screen: NEGATIVE

## 2022-11-04 ENCOUNTER — Ambulatory Visit: Payer: Medicare HMO | Attending: Cardiology | Admitting: Cardiology

## 2022-11-04 ENCOUNTER — Encounter: Payer: Self-pay | Admitting: Cardiology

## 2022-11-04 VITALS — BP 110/62 | HR 66 | Ht 61.0 in | Wt 170.0 lb

## 2022-11-04 DIAGNOSIS — E782 Mixed hyperlipidemia: Secondary | ICD-10-CM | POA: Diagnosis not present

## 2022-11-04 DIAGNOSIS — Z01810 Encounter for preprocedural cardiovascular examination: Secondary | ICD-10-CM

## 2022-11-04 DIAGNOSIS — Z79899 Other long term (current) drug therapy: Secondary | ICD-10-CM

## 2022-11-04 NOTE — Progress Notes (Signed)
Cardiology Office Note:  .   Date:  11/04/2022  ID:  WILHELMENIA SORBO, DOB Nov 06, 1950, MRN [id number] PCP: Diamantina Providence, FNP  [facility] Providers Cardiologist:  Truett Mainland, MD PCP: Diamantina Providence, FNP  Chief Complaint  Patient presents with   Pre-op Exam           History of Present Illness: .    ADDLEY JAMA is a 72 y.o. female with hypertension, hyperlipidemia, type 2 DM   Patient is here with her daughter today.  Unfortunately, she was diagnosed with uterine serous carcinoma and is going to undergo the following surgery with Dr. Pricilla Holm:  Robotic assisted hysterectomy, bilateral salpingo-oophorectomy, sentinel lymph node evaluation, possible lymph node dissection, omentectomy, possible tumor debulking, possible laparotomy   She still stays active, walks around the house, goes to stores, cleans around the house.  With this level of activity, she denies any complaints of chest pain, shortness of breath.  Of note, patient last had a stress test in 2020 which did show abnormalities, but were thought to be artifactual due to breast tissue attenuation.  In last 4 years, she has not had any cardiac event.  Vitals:   11/04/22 1146  BP: 110/62  Pulse: 66  SpO2: 98%     ROS:  Review of Systems  Cardiovascular:  Negative for chest pain, dyspnea on exertion, leg swelling, palpitations and syncope.     Studies Reviewed: Marland Kitchen       EKG 10/29/2022: Normal sinus rhythm Left axis deviation Cannot rule out Anterior infarct , age undetermined Abnormal ECG When compared with ECG of 01-Jul-2018 12:59, axis has shifted Confirmed by Alverda Skeans ([id number]) on 10/29/2022 10:31:43 PM  Risk Assessment/Calculations:   See below regarding preoperative risk assessment.  Physical Exam:   Physical Exam Vitals and nursing note reviewed.  Constitutional:      General: She is not in acute distress. Neck:     Vascular: No JVD.  Cardiovascular:     Rate and Rhythm:  Normal rate and regular rhythm.     Heart sounds: Normal heart sounds. No murmur heard. Pulmonary:     Effort: Pulmonary effort is normal.     Breath sounds: Normal breath sounds. No wheezing or rales.  Musculoskeletal:     Right lower leg: No edema.     Left lower leg: No edema.      VISIT DIAGNOSES:   ICD-10-CM   1. Mixed hyperlipidemia  E78.2 Lipid panel    2. Preop cardiovascular exam  Z01.810     3. Medication management  Z79.899 Lipid panel       ASSESSMENT AND PLAN: .    MESCAL HAWKEY is a 72 y.o. female with  hypertension, hyperlipidemia, type 2 DM  Preop evaluation: Prior abnormal stress test in 2020 most likely due to tissue attenuation. No cardiac events in last 4 years. No symptoms of angina, dyspnea with >4 METS functional capacity. Moderate MR on echocardiogram in 2020, with no significant murmur or symptoms suggestive of any higher severity. Based on 2024 [facility] perioperative guidelines, I believe her perioperative risk is acceptable and unlikely to require any further cardiac testing. I recommend proceeding with scheduled surgery for uterine serous carcinoma on 11/18/2022.   Hypertension: Controlled   Hyperlipidemia: No recent lipid panel.  Will check lipid panel.   No orders of the defined types were placed in this encounter.    F/u in 6 months.  If no further issues at that time,  I could see her as needed.  Signed, Elder Negus, MD

## 2022-11-04 NOTE — Patient Instructions (Signed)
Medication Instructions:  STOP using nitroglycerin.  *If you need a refill on your cardiac medications before your next appointment, please call your pharmacy*   Lab Work: Please complete a FASTING lipid panel today in our lab.  If you have labs (blood work) drawn today and your tests are completely normal, you will receive your results only by: MyChart Message (if you have MyChart) OR A paper copy in the mail If you have any lab test that is abnormal or we need to change your treatment, we will call you to review the results.   Testing/Procedures: None.   Follow-Up:     Your next appointment:   6 month(s)  Provider:   Dr. Truett Mainland

## 2022-11-05 ENCOUNTER — Other Ambulatory Visit: Payer: Self-pay | Admitting: *Deleted

## 2022-11-05 DIAGNOSIS — E1140 Type 2 diabetes mellitus with diabetic neuropathy, unspecified: Secondary | ICD-10-CM

## 2022-11-05 DIAGNOSIS — E782 Mixed hyperlipidemia: Secondary | ICD-10-CM

## 2022-11-05 LAB — LIPID PANEL
Chol/HDL Ratio: 3.3 {ratio} (ref 0.0–4.4)
Cholesterol, Total: 196 mg/dL (ref 100–199)
HDL: 59 mg/dL (ref >39)
LDL Chol Calc (NIH): 123 mg/dL — ABNORMAL HIGH (ref 0–99)
Triglycerides: 79 mg/dL (ref 0–149)
VLDL Cholesterol Cal: 14 mg/dL (ref 5–40)

## 2022-11-05 MED ORDER — ROSUVASTATIN CALCIUM 20 MG PO TABS: 20.0000 mg | ORAL_TABLET | Freq: Every day | ORAL | 3 refills | Status: DC

## 2022-11-05 NOTE — Progress Notes (Signed)
Cholesterol is elevated. It appears that she is on no stain at this time. Given her diabetes and elevated cholesterol, recommend statin therapy with Crestor 20 mg daily. Please send prescription and repeat lipid panel in 3 months.  Thanks MJP

## 2022-11-08 NOTE — Progress Notes (Signed)
PT had surgery date and time change.  LKVMM on [phone] and [phone] and asked for call back to review date and time and to review preop instructions.

## 2022-11-10 ENCOUNTER — Inpatient Hospital Stay: Payer: Medicare HMO | Admitting: Gynecologic Oncology

## 2022-11-11 ENCOUNTER — Telehealth: Payer: Self-pay | Admitting: *Deleted

## 2022-11-11 NOTE — Telephone Encounter (Signed)
-----   Message from [name] sent at 11/11/2022  1:07 PM EDT ----- Could you please call the patient to relay the message that I attached to this scan? Thank you

## 2022-11-11 NOTE — Telephone Encounter (Signed)
-----   Message from Alice Hickman sent at 11/11/2022  1:07 PM EDT ----- Could you please call the patient to relay the message that I attached to this scan? Thank you

## 2022-11-11 NOTE — H&P (View-Only) (Signed)
Could you please call the patient to relay the message that I attached to this scan? Thank you

## 2022-11-11 NOTE — Progress Notes (Signed)
Could you please call the patient to relay the message that I attached to this scan? Thank you

## 2022-11-11 NOTE — Telephone Encounter (Signed)
Spoke with [employer], patient's daughter and relayed message from Dr. Pricilla Holm that her CT scan showed no definitive evidence of cancer spread outside the uterus. Great News!  Alice Hickman asked if her mother will still need radiation after her surgery. Pt advised that this would be a conversation with provider, but if this has already been discussed, radiation is done after surgery to kill any remaining microscopic cancer cells that can't be seen, it's like an extra insurance policy. Advised Alice Hickman that her question would be relayed to provider. Pt's daughter thanked the office for calling.

## 2022-11-11 NOTE — Anesthesia Preprocedure Evaluation (Addendum)
Anesthesia Evaluation  Patient identified by MRN, date of birth, ID band Patient awake    Reviewed: Allergy & Precautions, H&P , NPO status , Patient's Chart, lab work & pertinent test results  Airway Mallampati: II  TM Distance: >3 FB Neck ROM: Full    Dental  (+) Edentulous Upper   Pulmonary neg pulmonary ROS   Pulmonary exam normal breath sounds clear to auscultation       Cardiovascular hypertension, Normal cardiovascular exam+ Valvular Problems/Murmurs MR  Rhythm:Regular Rate:Normal     Neuro/Psych  PSYCHIATRIC DISORDERS Anxiety Depression    negative neurological ROS     GI/Hepatic Neg liver ROS,GERD  ,,  Endo/Other  diabetes    Renal/GU negative Renal ROS   Endometrial cancer    Musculoskeletal  (+) Arthritis ,    Abdominal   Peds negative pediatric ROS (+)  Hematology  (+) Blood dyscrasia, anemia   Anesthesia Other Findings   Reproductive/Obstetrics negative OB ROS                             Anesthesia Physical Anesthesia Plan  ASA: 3  Anesthesia Plan: General   Post-op Pain Management:    Induction: Intravenous  PONV Risk Score and Plan: Ondansetron and Dexamethasone  Airway Management Planned: Oral ETT  Additional Equipment:   Intra-op Plan:   Post-operative Plan: Extubation in OR  Informed Consent: I have reviewed the patients History and Physical, chart, labs and discussed the procedure including the risks, benefits and alternatives for the proposed anesthesia with the patient or authorized representative who has indicated his/her understanding and acceptance.     Dental advisory given  Plan Discussed with: CRNA  Anesthesia Plan Comments: (See PAT note 10/29/2022, Christeen Douglas, PA-C  72 y.o. never smoker with h/o DM II, HTN, endometrial cancer scheduled for above procedure 11/03/2022 with Dr. Eugene Garnet. )       Anesthesia Quick Evaluation

## 2022-11-11 NOTE — Telephone Encounter (Signed)
Attempted to reach patient in regards to CT scan results. Left voicemail requesting call back.

## 2022-11-16 NOTE — Discharge Instructions (Addendum)
AFTER SURGERY INSTRUCTIONS   Return to work: 4-6 weeks if applicable   Activity: 1. Be up and out of the bed during the day.  Take a nap if needed.  You may walk up steps but be careful and use the hand rail.  Stair climbing will tire you more than you think, you may need to stop part way and rest.    2. No lifting or straining for 6 weeks over 10 pounds. No pushing, pulling, straining for 6 weeks.   3. No driving for around 1 week(s).  Do not drive if you are taking narcotic pain medicine and make sure that your reaction time has returned.    4. You can shower as soon as the next day after surgery. Shower daily.  Use your regular soap and water (not directly on the incision) and pat your incision(s) dry afterwards; don't rub.  No tub baths or submerging your body in water until cleared by your surgeon. If you have the soap that was given to you by pre-surgical testing that was used before surgery, you do not need to use it afterwards because this can irritate your incisions.    5. No sexual activity and nothing in the vagina for 10 weeks.   6. You may experience a small amount of clear drainage from your incisions, which is normal.  If the drainage persists, increases, or changes color please call the office.   7. Do not use creams, lotions, or ointments such as neosporin on your incisions after surgery until advised by your surgeon because they can cause removal of the dermabond glue on your incisions.     8. You may experience vaginal spotting after surgery or when the stitches at the top of the vagina begin to dissolve.  The spotting is normal but if you experience heavy bleeding, call our office.   9. Take Tylenol or ibuprofen (OR MOBIC, DO NOT TAKE TOGETHER) first for pain if you are able to take these medications and only use narcotic pain medication for severe pain not relieved by the Tylenol or Ibuprofen (or mobic (meloxicam).  Monitor your Tylenol intake to a max of 4,000 mg in a 24  hour period.    Diet: 1. Low sodium Heart Healthy Diet is recommended but you are cleared to resume your normal (before surgery) diet after your procedure.   2. It is safe to use a laxative, such as Miralax or Colace, if you have difficulty moving your bowels. You have been prescribed Sennakot-S to take at bedtime every evening after surgery to keep bowel movements regular and to prevent constipation.     Wound Care: 1. Keep clean and dry.  Shower daily.   Reasons to call the Doctor: Fever - Oral temperature greater than 100.4 degrees Fahrenheit Foul-smelling vaginal discharge Difficulty urinating Nausea and vomiting Increased pain at the site of the incision that is unrelieved with pain medicine. Difficulty breathing with or without chest pain New calf pain especially if only on one side Sudden, continuing increased vaginal bleeding with or without clots.   Contacts: For questions or concerns you should contact:   Dr. Eugene Garnet at [phone]   Warner Mccreedy, NP at [phone]   After Hours: call [phone] and have the GYN Oncologist paged/contacted (after 5 pm or on the weekends). You will speak with an after hours RN and let he or she know you have had surgery.   Messages sent via mychart are for non-urgent matters and are not  responded to after hours so for urgent needs, please call the after hours number.

## 2022-11-17 ENCOUNTER — Telehealth: Payer: Self-pay

## 2022-11-17 ENCOUNTER — Encounter: Payer: Self-pay | Admitting: Obstetrics & Gynecology

## 2022-11-17 NOTE — Telephone Encounter (Signed)
Telephone call to check on pre-operative status.  Patient compliant with pre-operative instructions.  Reinforced nothing to eat after midnight. Clear liquids until 0845. Patient to arrive at 0945.  No questions or concerns voiced.  Instructed to call for any needs.

## 2022-11-18 ENCOUNTER — Other Ambulatory Visit: Payer: Self-pay

## 2022-11-18 ENCOUNTER — Encounter (HOSPITAL_COMMUNITY): Payer: Self-pay | Admitting: Gynecologic Oncology

## 2022-11-18 ENCOUNTER — Encounter (HOSPITAL_COMMUNITY)
Admission: RE | Disposition: A | Discharge status: Home / Self Care | Payer: Self-pay | Source: Ambulatory Visit | Attending: Gynecologic Oncology

## 2022-11-18 ENCOUNTER — Inpatient Hospital Stay (HOSPITAL_COMMUNITY)
Admission: RE | Admit: 2022-11-18 | Discharge: 2022-11-19 | DRG: 756 | Disposition: A | Discharge status: Home / Self Care | Payer: Medicare HMO | Source: Ambulatory Visit | Attending: Gynecologic Oncology | Admitting: Gynecologic Oncology

## 2022-11-18 ENCOUNTER — Inpatient Hospital Stay (HOSPITAL_COMMUNITY): Payer: Medicare HMO | Admitting: Physician Assistant

## 2022-11-18 ENCOUNTER — Inpatient Hospital Stay (HOSPITAL_BASED_OUTPATIENT_CLINIC_OR_DEPARTMENT_OTHER): Payer: Medicare HMO | Admitting: Physician Assistant

## 2022-11-18 DIAGNOSIS — Z8249 Family history of ischemic heart disease and other diseases of the circulatory system: Secondary | ICD-10-CM

## 2022-11-18 DIAGNOSIS — Z780 Asymptomatic menopausal state: Secondary | ICD-10-CM

## 2022-11-18 DIAGNOSIS — E119 Type 2 diabetes mellitus without complications: Secondary | ICD-10-CM

## 2022-11-18 DIAGNOSIS — E1142 Type 2 diabetes mellitus with diabetic polyneuropathy: Secondary | ICD-10-CM | POA: Diagnosis present

## 2022-11-18 DIAGNOSIS — Z880 Allergy status to penicillin: Secondary | ICD-10-CM

## 2022-11-18 DIAGNOSIS — M109 Gout, unspecified: Secondary | ICD-10-CM | POA: Diagnosis present

## 2022-11-18 DIAGNOSIS — J309 Allergic rhinitis, unspecified: Secondary | ICD-10-CM | POA: Diagnosis present

## 2022-11-18 DIAGNOSIS — C541 Malignant neoplasm of endometrium: Secondary | ICD-10-CM | POA: Diagnosis not present

## 2022-11-18 DIAGNOSIS — I10 Essential (primary) hypertension: Secondary | ICD-10-CM | POA: Diagnosis present

## 2022-11-18 DIAGNOSIS — Z79899 Other long term (current) drug therapy: Secondary | ICD-10-CM

## 2022-11-18 DIAGNOSIS — H409 Unspecified glaucoma: Secondary | ICD-10-CM | POA: Diagnosis present

## 2022-11-18 DIAGNOSIS — K219 Gastro-esophageal reflux disease without esophagitis: Secondary | ICD-10-CM | POA: Diagnosis present

## 2022-11-18 DIAGNOSIS — Z7984 Long term (current) use of oral hypoglycemic drugs: Secondary | ICD-10-CM

## 2022-11-18 DIAGNOSIS — Z888 Allergy status to other drugs, medicaments and biological substances status: Secondary | ICD-10-CM

## 2022-11-18 DIAGNOSIS — E785 Hyperlipidemia, unspecified: Secondary | ICD-10-CM | POA: Diagnosis present

## 2022-11-18 DIAGNOSIS — Z7982 Long term (current) use of aspirin: Secondary | ICD-10-CM

## 2022-11-18 HISTORY — PX: SENTINEL NODE BIOPSY: SHX6608

## 2022-11-18 HISTORY — PX: CYSTOSCOPY: SHX5120

## 2022-11-18 HISTORY — PX: ROBOTIC ASSISTED TOTAL HYSTERECTOMY WITH BILATERAL SALPINGO OOPHERECTOMY: SHX6086

## 2022-11-18 LAB — GLUCOSE, CAPILLARY
Glucose-Capillary: 70 mg/dL (ref 70–99)
Glucose-Capillary: 118 mg/dL — ABNORMAL HIGH (ref 70–99)
Glucose-Capillary: 156 mg/dL — ABNORMAL HIGH (ref 70–99)

## 2022-11-18 SURGERY — HYSTERECTOMY, TOTAL, ROBOT-ASSISTED, LAPAROSCOPIC, WITH BILATERAL SALPINGO-OOPHORECTOMY
Anesthesia: General | Site: Urethra

## 2022-11-18 MED ORDER — INSULIN ASPART 100 UNIT/ML IJ SOLN
0.0000 [IU] | Freq: Three times a day (TID) | INTRAMUSCULAR | Status: DC
  Administered 2022-11-19 (×2): 1 [IU] via SUBCUTANEOUS

## 2022-11-18 MED ORDER — PROPOFOL 10 MG/ML IV BOLUS
INTRAVENOUS | Status: DC | PRN
  Administered 2022-11-18: 150 mg via INTRAVENOUS

## 2022-11-18 MED ORDER — OXYCODONE HCL 5 MG/5ML PO SOLN: 5.0000 mg | Freq: Once | ORAL | Status: DC | PRN

## 2022-11-18 MED ORDER — LIDOCAINE HCL (PF) 2 % IJ SOLN
INTRAMUSCULAR | Status: AC
  Filled 2022-11-18: qty 5

## 2022-11-18 MED ORDER — CIPROFLOXACIN IN D5W 400 MG/200ML IV SOLN
400.0000 mg | INTRAVENOUS | Status: AC
  Administered 2022-11-18: 400 mg via INTRAVENOUS
  Filled 2022-11-18: qty 200

## 2022-11-18 MED ORDER — STERILE WATER FOR INJECTION IJ SOLN
INTRAMUSCULAR | Status: AC
  Filled 2022-11-18: qty 10

## 2022-11-18 MED ORDER — ENOXAPARIN SODIUM 40 MG/0.4ML IJ SOSY
40.0000 mg | PREFILLED_SYRINGE | INTRAMUSCULAR | Status: DC
  Filled 2022-11-18: qty 0.4

## 2022-11-18 MED ORDER — LIDOCAINE HCL (CARDIAC) PF 100 MG/5ML IV SOSY
PREFILLED_SYRINGE | INTRAVENOUS | Status: DC | PRN
  Administered 2022-11-18: 60 mg via INTRAVENOUS

## 2022-11-18 MED ORDER — DEXMEDETOMIDINE HCL IN NACL 80 MCG/20ML IV SOLN
INTRAVENOUS | Status: AC
  Filled 2022-11-18: qty 20

## 2022-11-18 MED ORDER — ACETAMINOPHEN 10 MG/ML IV SOLN
INTRAVENOUS | Status: DC | PRN
Start: 2022-11-18 — End: 2022-11-18
  Administered 2022-11-18: 1000 mg via INTRAVENOUS

## 2022-11-18 MED ORDER — SENNOSIDES-DOCUSATE SODIUM 8.6-50 MG PO TABS
2.0000 | ORAL_TABLET | Freq: Every day | ORAL | Status: DC
  Administered 2022-11-18: 2 via ORAL
  Filled 2022-11-18: qty 2

## 2022-11-18 MED ORDER — TRAMADOL HCL 50 MG PO TABS
50.0000 mg | ORAL_TABLET | Freq: Four times a day (QID) | ORAL | Status: DC | PRN
  Administered 2022-11-18: 50 mg via ORAL
  Filled 2022-11-18: qty 1

## 2022-11-18 MED ORDER — CLINDAMYCIN PHOSPHATE 900 MG/50ML IV SOLN
900.0000 mg | INTRAVENOUS | Status: AC
  Administered 2022-11-18: 900 mg via INTRAVENOUS
  Filled 2022-11-18: qty 50

## 2022-11-18 MED ORDER — HYDROMORPHONE HCL 1 MG/ML IJ SOLN
0.5000 mg | INTRAMUSCULAR | Status: DC | PRN
  Administered 2022-11-18: 0.5 mg via INTRAVENOUS
  Filled 2022-11-18: qty 0.5

## 2022-11-18 MED ORDER — EPHEDRINE 5 MG/ML INJ
INTRAVENOUS | Status: AC
  Filled 2022-11-18: qty 5

## 2022-11-18 MED ORDER — OXYCODONE HCL 5 MG PO TABS: 5.0000 mg | ORAL_TABLET | Freq: Once | ORAL | Status: DC | PRN

## 2022-11-18 MED ORDER — FENTANYL CITRATE PF 50 MCG/ML IJ SOSY: 25.0000 ug | PREFILLED_SYRINGE | INTRAMUSCULAR | Status: DC | PRN

## 2022-11-18 MED ORDER — PHENYLEPHRINE 80 MCG/ML (10ML) SYRINGE FOR IV PUSH (FOR BLOOD PRESSURE SUPPORT)
PREFILLED_SYRINGE | INTRAVENOUS | Status: AC
  Filled 2022-11-18: qty 10

## 2022-11-18 MED ORDER — FENTANYL CITRATE (PF) 100 MCG/2ML IJ SOLN
INTRAMUSCULAR | Status: AC
  Filled 2022-11-18: qty 2

## 2022-11-18 MED ORDER — OXYCODONE HCL 5 MG PO TABS
5.0000 mg | ORAL_TABLET | ORAL | Status: DC | PRN
  Administered 2022-11-18: 5 mg via ORAL
  Filled 2022-11-18: qty 2

## 2022-11-18 MED ORDER — BUPIVACAINE HCL 0.25 % IJ SOLN
INTRAMUSCULAR | Status: DC | PRN
  Administered 2022-11-18: 25 mL

## 2022-11-18 MED ORDER — COLCHICINE 0.6 MG PO TABS: 0.6000 mg | ORAL_TABLET | Freq: Two times a day (BID) | ORAL | Status: DC | PRN

## 2022-11-18 MED ORDER — ALLOPURINOL 100 MG PO TABS
100.0000 mg | ORAL_TABLET | Freq: Every day | ORAL | Status: DC
  Administered 2022-11-19: 100 mg via ORAL
  Filled 2022-11-18: qty 1

## 2022-11-18 MED ORDER — EPHEDRINE SULFATE (PRESSORS) 50 MG/ML IJ SOLN
INTRAMUSCULAR | Status: DC | PRN
Start: 2022-11-18 — End: 2022-11-18
  Administered 2022-11-18: 5 mg via INTRAVENOUS

## 2022-11-18 MED ORDER — STERILE WATER FOR IRRIGATION IR SOLN
Status: DC | PRN
  Administered 2022-11-18: 1000 mL

## 2022-11-18 MED ORDER — CHLORHEXIDINE GLUCONATE 0.12 % MT SOLN
15.0000 mL | Freq: Once | OROMUCOSAL | Status: AC
  Administered 2022-11-18: 15 mL via OROMUCOSAL

## 2022-11-18 MED ORDER — LACTATED RINGERS IR SOLN
Status: DC | PRN
  Administered 2022-11-18: 1000 mL

## 2022-11-18 MED ORDER — FENTANYL CITRATE (PF) 100 MCG/2ML IJ SOLN
INTRAMUSCULAR | Status: DC | PRN
  Administered 2022-11-18: 25 ug via INTRAVENOUS
  Administered 2022-11-18: 50 ug via INTRAVENOUS
  Administered 2022-11-18: 25 ug via INTRAVENOUS
  Administered 2022-11-18: 50 ug via INTRAVENOUS

## 2022-11-18 MED ORDER — ONDANSETRON HCL 4 MG/2ML IJ SOLN
INTRAMUSCULAR | Status: DC | PRN
  Administered 2022-11-18: 4 mg via INTRAVENOUS

## 2022-11-18 MED ORDER — PROPOFOL 10 MG/ML IV BOLUS
INTRAVENOUS | Status: AC
  Filled 2022-11-18: qty 20

## 2022-11-18 MED ORDER — ORAL CARE MOUTH RINSE: 15.0000 mL | Freq: Once | OROMUCOSAL | Status: AC

## 2022-11-18 MED ORDER — DEXAMETHASONE SODIUM PHOSPHATE 10 MG/ML IJ SOLN
INTRAMUSCULAR | Status: AC
  Filled 2022-11-18: qty 1

## 2022-11-18 MED ORDER — PHENYLEPHRINE HCL (PRESSORS) 10 MG/ML IV SOLN
INTRAVENOUS | Status: DC | PRN
Start: 2022-11-18 — End: 2022-11-18
  Administered 2022-11-18: 80 ug via INTRAVENOUS
  Administered 2022-11-18 (×2): 160 ug via INTRAVENOUS
  Administered 2022-11-18: 80 ug via INTRAVENOUS
  Administered 2022-11-18: 160 ug via INTRAVENOUS

## 2022-11-18 MED ORDER — ACETAMINOPHEN 500 MG PO TABS
1000.0000 mg | ORAL_TABLET | ORAL | Status: DC
  Filled 2022-11-18: qty 2

## 2022-11-18 MED ORDER — DEXMEDETOMIDINE HCL IN NACL 200 MCG/50ML IV SOLN
INTRAVENOUS | Status: DC | PRN
Start: 2022-11-18 — End: 2022-11-18
  Administered 2022-11-18 (×3): 8 ug via INTRAVENOUS
  Administered 2022-11-18: 4 ug via INTRAVENOUS

## 2022-11-18 MED ORDER — ONDANSETRON HCL 4 MG/2ML IJ SOLN
4.0000 mg | Freq: Four times a day (QID) | INTRAMUSCULAR | Status: DC | PRN
  Administered 2022-11-18: 4 mg via INTRAVENOUS
  Filled 2022-11-18: qty 2

## 2022-11-18 MED ORDER — ONDANSETRON HCL 4 MG PO TABS: 4.0000 mg | ORAL_TABLET | Freq: Four times a day (QID) | ORAL | Status: DC | PRN

## 2022-11-18 MED ORDER — ACETAMINOPHEN 10 MG/ML IV SOLN
INTRAVENOUS | Status: AC
  Filled 2022-11-18: qty 100

## 2022-11-18 MED ORDER — DROPERIDOL 2.5 MG/ML IJ SOLN: 0.6250 mg | Freq: Once | INTRAMUSCULAR | Status: DC | PRN

## 2022-11-18 MED ORDER — BUPIVACAINE HCL 0.25 % IJ SOLN
INTRAMUSCULAR | Status: AC
  Filled 2022-11-18: qty 1

## 2022-11-18 MED ORDER — HEPARIN SODIUM (PORCINE) 5000 UNIT/ML IJ SOLN
5000.0000 [IU] | INTRAMUSCULAR | Status: AC
  Administered 2022-11-18: 5000 [IU] via SUBCUTANEOUS
  Filled 2022-11-18: qty 1

## 2022-11-18 MED ORDER — ACETAMINOPHEN 500 MG PO TABS
1000.0000 mg | ORAL_TABLET | Freq: Four times a day (QID) | ORAL | Status: DC
  Administered 2022-11-18 – 2022-11-19 (×3): 1000 mg via ORAL
  Filled 2022-11-18 (×3): qty 2

## 2022-11-18 MED ORDER — ROCURONIUM BROMIDE 100 MG/10ML IV SOLN
INTRAVENOUS | Status: DC | PRN
  Administered 2022-11-18: 80 mg via INTRAVENOUS

## 2022-11-18 MED ORDER — STERILE WATER FOR INJECTION IJ SOLN
INTRAMUSCULAR | Status: DC | PRN
  Administered 2022-11-18: 10 mL

## 2022-11-18 MED ORDER — ACETAMINOPHEN 10 MG/ML IV SOLN: 1000.0000 mg | Freq: Once | INTRAVENOUS | Status: DC | PRN

## 2022-11-18 MED ORDER — LIDOCAINE HCL (PF) 2 % IJ SOLN
INTRAMUSCULAR | Status: DC | PRN
  Administered 2022-11-18: 1.5 mg/kg/h

## 2022-11-18 MED ORDER — LACTATED RINGERS IV SOLN: INTRAVENOUS | Status: DC

## 2022-11-18 MED ORDER — LACTATED RINGERS IV SOLN: INTRAVENOUS | Status: DC | PRN

## 2022-11-18 MED ORDER — INSULIN ASPART 100 UNIT/ML IJ SOLN: 0.0000 [IU] | INTRAMUSCULAR | Status: DC | PRN

## 2022-11-18 MED ORDER — ROCURONIUM BROMIDE 10 MG/ML (PF) SYRINGE
PREFILLED_SYRINGE | INTRAVENOUS | Status: AC
  Filled 2022-11-18: qty 10

## 2022-11-18 MED ORDER — SUGAMMADEX SODIUM 200 MG/2ML IV SOLN
INTRAVENOUS | Status: DC | PRN
  Administered 2022-11-18: 200 mg via INTRAVENOUS

## 2022-11-18 MED ORDER — DEXAMETHASONE SODIUM PHOSPHATE 4 MG/ML IJ SOLN
4.0000 mg | INTRAMUSCULAR | Status: AC
  Administered 2022-11-18: 10 mg via INTRAVENOUS

## 2022-11-18 MED ORDER — ONDANSETRON HCL 4 MG/2ML IJ SOLN
INTRAMUSCULAR | Status: AC
  Filled 2022-11-18: qty 2

## 2022-11-18 SURGICAL SUPPLY — 80 items
ADH SKN CLS APL DERMABOND .7 (GAUZE/BANDAGES/DRESSINGS) ×2
AGENT HMST KT MTR STRL THRMB (HEMOSTASIS)
APL ESCP 34 STRL LF DISP (HEMOSTASIS)
APPLICATOR SURGIFLO ENDO (HEMOSTASIS) IMPLANT
BAG COUNTER SPONGE SURGICOUNT (BAG) IMPLANT
BAG LAPAROSCOPIC 12 15 PORT 16 (BASKET) IMPLANT
BAG RETRIEVAL 12/15 (BASKET) ×4
BAG SPNG CNTER NS LX DISP (BAG)
BLADE SURG SZ10 CARB STEEL (BLADE) IMPLANT
COVER BACK TABLE 60X90IN (DRAPES) ×2 IMPLANT
COVER TIP SHEARS 8 DVNC (MISCELLANEOUS) ×2 IMPLANT
DERMABOND ADVANCED .7 DNX12 (GAUZE/BANDAGES/DRESSINGS) ×2 IMPLANT
DRAPE ARM DVNC X/XI (DISPOSABLE) ×8 IMPLANT
DRAPE COLUMN DVNC XI (DISPOSABLE) ×2 IMPLANT
DRAPE SHEET LG 3/4 BI-LAMINATE (DRAPES) ×2 IMPLANT
DRAPE SURG IRRIG POUCH 19X23 (DRAPES) ×2 IMPLANT
DRIVER NDL MEGA SUTCUT DVNCXI (INSTRUMENTS) ×2 IMPLANT
DRIVER NDLE MEGA SUTCUT DVNCXI (INSTRUMENTS) ×2
DRSG OPSITE POSTOP 4X6 (GAUZE/BANDAGES/DRESSINGS) IMPLANT
DRSG OPSITE POSTOP 4X8 (GAUZE/BANDAGES/DRESSINGS) IMPLANT
ELECT PENCIL ROCKER SW 15FT (MISCELLANEOUS) IMPLANT
ELECT REM PT RETURN 15FT ADLT (MISCELLANEOUS) ×2 IMPLANT
FORCEPS BPLR FENES DVNC XI (FORCEP) ×2 IMPLANT
FORCEPS PROGRASP DVNC XI (FORCEP) ×2 IMPLANT
GAUZE 4X4 16PLY ~~LOC~~+RFID DBL (SPONGE) ×4 IMPLANT
GLOVE BIO SURGEON STRL SZ 6 (GLOVE) ×8 IMPLANT
GLOVE BIO SURGEON STRL SZ 6.5 (GLOVE) ×2 IMPLANT
GOWN STRL REUS W/ TWL LRG LVL3 (GOWN DISPOSABLE) ×8 IMPLANT
GOWN STRL REUS W/TWL LRG LVL3 (GOWN DISPOSABLE) ×8
GRASPER SUT TROCAR 14GX15 (MISCELLANEOUS) IMPLANT
HOLDER FOLEY CATH W/STRAP (MISCELLANEOUS) IMPLANT
IRRIG SUCT STRYKERFLOW 2 WTIP (MISCELLANEOUS) ×2
IRRIGATION SUCT STRKRFLW 2 WTP (MISCELLANEOUS) ×2 IMPLANT
KIT PROCEDURE DVNC SI (MISCELLANEOUS) IMPLANT
KIT TURNOVER KIT A (KITS) IMPLANT
LIGASURE IMPACT 36 18CM CVD LR (INSTRUMENTS) IMPLANT
MANIPULATOR ADVINCU DEL 3.0 PL (MISCELLANEOUS) IMPLANT
MANIPULATOR ADVINCU DEL 3.5 PL (MISCELLANEOUS) IMPLANT
MANIPULATOR UTERINE 4.5 ZUMI (MISCELLANEOUS) IMPLANT
NDL HYPO 21X1.5 SAFETY (NEEDLE) ×2 IMPLANT
NDL SPNL 18GX3.5 QUINCKE PK (NEEDLE) IMPLANT
NEEDLE HYPO 21X1.5 SAFETY (NEEDLE) ×2
NEEDLE SPNL 18GX3.5 QUINCKE PK (NEEDLE)
OBTURATOR OPTICAL STND 8 DVNC (TROCAR) ×2
OBTURATOR OPTICALSTD 8 DVNC (TROCAR) ×2 IMPLANT
PACK ROBOT GYN CUSTOM WL (TRAY / TRAY PROCEDURE) ×2 IMPLANT
PACKING VAGINAL (PACKING) IMPLANT
PAD POSITIONING PINK XL (MISCELLANEOUS) ×2 IMPLANT
PORT ACCESS TROCAR AIRSEAL 12 (TROCAR) IMPLANT
SCISSORS MNPLR CVD DVNC XI (INSTRUMENTS) ×2 IMPLANT
SCRUB CHG 4% DYNA-HEX 4OZ (MISCELLANEOUS) IMPLANT
SEAL UNIV 5-12 XI (MISCELLANEOUS) ×8 IMPLANT
SET TRI-LUMEN FLTR TB AIRSEAL (TUBING) ×2 IMPLANT
SPIKE FLUID TRANSFER (MISCELLANEOUS) ×2 IMPLANT
SPONGE T-LAP 18X18 ~~LOC~~+RFID (SPONGE) IMPLANT
SURGIFLO W/THROMBIN 8M KIT (HEMOSTASIS) IMPLANT
SUT MNCRL AB 4-0 PS2 18 (SUTURE) IMPLANT
SUT PDS AB 1 TP1 96 (SUTURE) IMPLANT
SUT V-LOC 180 0-0 GS22 (SUTURE) IMPLANT
SUT VIC AB 0 CT1 27 (SUTURE)
SUT VIC AB 0 CT1 27XBRD ANTBC (SUTURE) IMPLANT
SUT VIC AB 2-0 CT1 27 (SUTURE)
SUT VIC AB 2-0 CT1 TAPERPNT 27 (SUTURE) IMPLANT
SUT VIC AB 2-0 SH 27 (SUTURE) ×2
SUT VIC AB 2-0 SH 27X BRD (SUTURE) IMPLANT
SUT VIC AB 4-0 PS2 18 (SUTURE) ×4 IMPLANT
SUT VICRYL 0 27 CT2 27 ABS (SUTURE) ×2 IMPLANT
SUT VLOC 180 0 9IN GS21 (SUTURE) IMPLANT
SYR 10ML LL (SYRINGE) IMPLANT
SYS BAG RETRIEVAL 10MM (BASKET)
SYS WOUND ALEXIS 18CM MED (MISCELLANEOUS)
SYSTEM BAG RETRIEVAL 10MM (BASKET) IMPLANT
SYSTEM WOUND ALEXIS 18CM MED (MISCELLANEOUS) IMPLANT
TOWEL OR NON WOVEN STRL DISP B (DISPOSABLE) IMPLANT
TRAP SPECIMEN MUCUS 40CC (MISCELLANEOUS) IMPLANT
TRAY FOLEY MTR SLVR 16FR STAT (SET/KITS/TRAYS/PACK) ×2 IMPLANT
TROCAR PORT AIRSEAL 5X120 (TROCAR) IMPLANT
UNDERPAD 30X36 HEAVY ABSORB (UNDERPADS AND DIAPERS) ×4 IMPLANT
WATER STERILE IRR 1000ML POUR (IV SOLUTION) ×2 IMPLANT
YANKAUER SUCT BULB TIP 10FT TU (MISCELLANEOUS) IMPLANT

## 2022-11-18 NOTE — Anesthesia Procedure Notes (Signed)
Procedure Name: Intubation Date/Time: 11/18/2022 12:10 PM  Performed by: Lily Lovings, CRNAPre-anesthesia Checklist: Patient identified, Patient being monitored, Timeout performed, Emergency Drugs available and Suction available Patient Re-evaluated:Patient Re-evaluated prior to induction Oxygen Delivery Method: Circle system utilized Preoxygenation: Pre-oxygenation with 100% oxygen Induction Type: IV induction Ventilation: Mask ventilation without difficulty Laryngoscope Size: Mac and 3 Grade View: Grade I Tube type: Oral Tube size: 7.5 mm Number of attempts: 1 Airway Equipment and Method: Stylet Placement Confirmation: ETT inserted through vocal cords under direct vision, positive ETCO2 and breath sounds checked- equal and bilateral Secured at: 21 cm Tube secured with: Tape Dental Injury: Teeth and Oropharynx as per pre-operative assessment  Comments: Soft bite block in

## 2022-11-18 NOTE — Transfer of Care (Signed)
Immediate Anesthesia Transfer of Care Note  Patient: Alice Hickman  Procedure(s) Performed: Procedure(s): XI ROBOTIC ASSISTED TOTAL HYSTERECTOMY WITH BILATERAL SALPINGO OOPHORECTOMY WITH OMENTECTOMY (N/A) SENTINEL NODE BIOPSY (N/A) CYSTOSCOPY  Patient Location: PACU  Anesthesia Type:General  Level of Consciousness:  sedated, patient cooperative and responds to stimulation  Airway & Oxygen Therapy:Patient Spontanous Breathing and Patient connected to face mask oxgen  Post-op Assessment:  Report given to PACU RN and Post -op Vital signs reviewed and stable  Post vital signs:  Reviewed and stable  Last Vitals:  Vitals:   11/18/22 1016  BP: 136/65  Pulse: (!) 57  Resp: 16  Temp: 36.7 C  SpO2: 98%    Complications: No apparent anesthesia complications

## 2022-11-18 NOTE — Interval H&P Note (Signed)
History and Physical Interval Note:  11/18/2022 10:57 AM  Alice Hickman  has presented today for surgery, with the diagnosis of ENDOMETRIAL CANCER.  The various methods of treatment have been discussed with the patient and family. After consideration of risks, benefits and other options for treatment, the patient has consented to  Procedure(s): XI ROBOTIC ASSISTED TOTAL HYSTERECTOMY WITH BILATERAL SALPINGO OOPHORECTOMY WITH OMENTECTOMY (N/A) SENTINEL NODE BIOPSY (N/A) POSSIBLE LYMPH NODE DISSECTION AND POSSIBLE LAPAROTOMY (N/A) as a surgical intervention.  The patient's history has been reviewed, patient examined, no change in status, stable for surgery.  I have reviewed the patient's chart and labs.  Questions were answered to the patient's satisfaction.     Carver Fila

## 2022-11-18 NOTE — Anesthesia Postprocedure Evaluation (Signed)
Anesthesia Post Note  Patient: Alice Hickman  Procedure(s) Performed: XI ROBOTIC ASSISTED TOTAL HYSTERECTOMY WITH BILATERAL SALPINGO OOPHORECTOMY WITH OMENTECTOMY SENTINEL NODE BIOPSY CYSTOSCOPY (Urethra)     Patient location during evaluation: PACU Anesthesia Type: General Level of consciousness: awake and alert Pain management: pain level controlled Vital Signs Assessment: post-procedure vital signs reviewed and stable Respiratory status: spontaneous breathing, nonlabored ventilation, respiratory function stable and patient connected to nasal cannula oxygen Cardiovascular status: blood pressure returned to baseline and stable Postop Assessment: no apparent nausea or vomiting Anesthetic complications: no   No notable events documented.  Last Vitals:  Vitals:   11/18/22 1630 11/18/22 1727  BP: 138/88 (!) 117/56  Pulse: 77 65  Resp: 18 18  Temp: 36.8 C 36.8 C  SpO2: 99% 95%    Last Pain:  Vitals:   11/18/22 1800  TempSrc:   PainSc: 8                  [place]

## 2022-11-18 NOTE — Plan of Care (Signed)
  Problem: Coping: Goal: Level of anxiety will decrease Outcome: Progressing   Problem: Pain Managment: Goal: General experience of comfort will improve Outcome: Progressing   Problem: Safety: Goal: Ability to remain free from injury will improve Outcome: Progressing   

## 2022-11-18 NOTE — Op Note (Signed)
OPERATIVE NOTE  Pre-operative Diagnosis: endometrial cancer, high grade  Post-operative Diagnosis: same, fibroid uterus  Operation: Robotic-assisted laparoscopic total hysterectomy with bilateral salpingoophorectomy, SLN biopsy bilaterally, omentectomy, cystoscopy, vaginal repair   Surgeon: Eugene Garnet MD  Assistant Surgeon: Warner Mccreedy, NP (an NP assistant was necessary for tissue manipulation, management of robotic instrumentation, retraction and positioning due to the complexity of the case and hospital policies).   Anesthesia: GET  Urine Output: 250 cc  Operative Findings:  : On EUA, enlarged mobile uterus. On intra-abdominal entry, normal upper abodminal survey. Normal omentum, small and large bowel. Uterus 12 cm with 4-5 cm calcified anterior fundal fibroid, mildly bulbous. Normal apeparing bilateral adnexa. Mapping successful to bilateral obturator SLNs, no obvious adenopathy. Right sulcal and right sidewall vaginal lacerations due to vaginal delivery of specimen, repaired. On cystoscopy, intact dome, good efflux from bilateral ureteral orifices.   Estimated Blood Loss:  125 cc      Total IV Fluids: see I&O flowsheet         Specimens: uterus, cervix, bilateral tubes and ovaries, pelvic washings, bilateral obturator SLNs, omentum         Complications:  None apparent; patient tolerated the procedure well.         Disposition: PACU - hemodynamically stable.  Procedure Details  The patient was seen in the Holding Room. The risks, benefits, complications, treatment options, and expected outcomes were discussed with the patient.  The patient concurred with the proposed plan, giving informed consent.  The site of surgery properly noted/marked. The patient was identified as Alice Hickman and the procedure verified as a Robotic-assisted hysterectomy with bilateral salpingo oophorectomy with SLN biopsy, omentectomy.   After induction of anesthesia, the patient was draped and  prepped in the usual sterile manner. Patient was placed in supine position after anesthesia and draped and prepped in the usual sterile manner as follows: Her arms were tucked to her side with all appropriate precautions.  The patient was secured to the bed using padding and tape across her chest.  The patient was placed in the semi-lithotomy position in Cecil stirrups.  The perineum and vagina were prepped with CHG. The patient's abdomen was prepped with ChloraPrep and she was draped after the prep had been allowed to dry for 3 minutes.  A Time Out was held and the above information confirmed.  The urethra was prepped with Betadine. Foley catheter was placed.  A sterile speculum was placed in the vagina.  The cervix was grasped with a single-tooth tenaculum. 2mg  total of ICG was injected into the cervical stroma at 2 and 9 o'clock with 1cc injected at a 1cm and 2mm depth (concentration 0.5mg /ml) in all locations. The cervix was dilated with Shawnie Pons dilators.  The ZUMI uterine manipulator with a medium colpotomizer ring was placed without difficulty.  A pneum occluder balloon was placed over the manipulator.  OG tube placement was confirmed and to suction.   Next, a 10 mm skin incision was made 1 cm below the subcostal margin in the midclavicular line.  The 5 mm Optiview port and scope was used for direct entry.  Opening pressure was under 10 mm CO2.  The abdomen was insufflated and the findings were noted as above.   At this point and all points during the procedure, the patient's intra-abdominal pressure did not exceed 15 mmHg. Next, an 8 mm skin incision was made superior to the umbilicus and a right and left port were placed about 8 cm lateral to the  robot port on the right and left side.  A fourth arm was placed on the right.  The 5 mm assist trocar was exchanged for a 10-12 mm port. All ports were placed under direct visualization.  The patient was placed in steep Trendelenburg.  Bowel was folded away into  the upper abdomen.  The robot was docked in the normal manner.  The right and left peritoneum were opened parallel to the IP ligament to open the retroperitoneal spaces bilaterally. The round ligaments were transected. The SLN mapping was performed in bilateral pelvic basins. After identifying the ureters, the para rectal and paravesical spaces were opened up entirely with careful dissection below the level of the ureters bilaterally and to the depth of the uterine artery origin in order to skeletonize the uterine "web" and ensure visualization of all parametrial channels. The para-aortic basins were carefully exposed and evaluated for isolated para-aortic SLN's. Lymphatic channels were identified travelling to the following visualized sentinel lymph node's: bilateral obturator SLNs. These SLN's were separated from their surrounding lymphatic tissue, removed and sent for permanent pathology.  The hysterectomy was started.  The ureter was again noted to be on the medial leaf of the broad ligament.  The peritoneum above the ureter was incised and stretched and the infundibulopelvic ligament was skeletonized, cauterized and cut.  The posterior peritoneum was taken down to the level of the KOH ring.  The anterior peritoneum was also taken down.  The bladder flap was created to the level of the KOH ring. Some adhesions were noted between the bladder and cervix.  The uterine artery on the right side was skeletonized, cauterized and cut in the normal manner.  A similar procedure was performed on the left.  The colpotomy was made and the uterus, cervix, bilateral ovaries and tubes were amputated, placed in an Endocatch bag and delivered through the vagina.  Pedicles were inspected and excellent hemostasis was achieved.    Table motion was performed after one instrument removed. Using a combination of the vessels sealer and monopolar electrocautery, the omentum was separated from the underlying transverse colon and an  infracolic omentectomy performed. This was then placed in an Endocatch bag and delivered out the vagina after the patient was placed in Trendelenburg again.   A right sulcal tear was noted and repaired with 2-0 Vicryl in a running fashion. The colpotomy at the vaginal cuff was closed with 0 Vicryl with a figure of eight at each apex and 0 V-Loc to close the midportion of the cuff in two layers in a running manner.  Irrigation was used and excellent hemostasis was achieved.  Pressure in the abdomen was decreased to 5 mm Hg with excellent hemostasis maintained. At this point in the procedure was completed.  Robotic instruments were removed under direct visulaization.  The robot was undocked. The fascia at the 10-12 mm port was closed with 0 Vicryl using a PMI fascial closure device.    Cystoscopy was then performed with findings noted above. Foley catheter was replaced.  The subcuticular tissue was closed with 4-0 Vicryl and the skin was closed with 4-0 Monocryl in a subcuticular manner.  Dermabond was applied.    The vagina was swabbed with some bleeding noted. A right vaginal side wall, distal left sidewall and posterior distal vaginal tears were noted and all repaired with running 2-0 Vicryl. Vagina was packed with moistened packing. All sponge, lap and needle Hickman were correct x  3.   The patient was transferred to  the recovery room in stable condition.  Eugene Garnet, MD

## 2022-11-18 NOTE — Plan of Care (Signed)
  Problem: Education: Goal: Understanding of post-operative needs will improve Outcome: Progressing   Problem: Coping: Goal: Level of anxiety will decrease Outcome: Progressing

## 2022-11-19 ENCOUNTER — Encounter (HOSPITAL_COMMUNITY): Payer: Self-pay | Admitting: Gynecologic Oncology

## 2022-11-19 DIAGNOSIS — Z7984 Long term (current) use of oral hypoglycemic drugs: Secondary | ICD-10-CM | POA: Diagnosis not present

## 2022-11-19 DIAGNOSIS — J309 Allergic rhinitis, unspecified: Secondary | ICD-10-CM | POA: Diagnosis present

## 2022-11-19 DIAGNOSIS — C541 Malignant neoplasm of endometrium: Secondary | ICD-10-CM | POA: Diagnosis present

## 2022-11-19 DIAGNOSIS — H409 Unspecified glaucoma: Secondary | ICD-10-CM | POA: Diagnosis present

## 2022-11-19 DIAGNOSIS — Z888 Allergy status to other drugs, medicaments and biological substances status: Secondary | ICD-10-CM | POA: Diagnosis not present

## 2022-11-19 DIAGNOSIS — M109 Gout, unspecified: Secondary | ICD-10-CM | POA: Diagnosis present

## 2022-11-19 DIAGNOSIS — E785 Hyperlipidemia, unspecified: Secondary | ICD-10-CM | POA: Diagnosis present

## 2022-11-19 DIAGNOSIS — K219 Gastro-esophageal reflux disease without esophagitis: Secondary | ICD-10-CM | POA: Diagnosis present

## 2022-11-19 DIAGNOSIS — E1142 Type 2 diabetes mellitus with diabetic polyneuropathy: Secondary | ICD-10-CM | POA: Diagnosis present

## 2022-11-19 DIAGNOSIS — Z8249 Family history of ischemic heart disease and other diseases of the circulatory system: Secondary | ICD-10-CM | POA: Diagnosis not present

## 2022-11-19 DIAGNOSIS — I10 Essential (primary) hypertension: Secondary | ICD-10-CM | POA: Diagnosis present

## 2022-11-19 DIAGNOSIS — Z780 Asymptomatic menopausal state: Secondary | ICD-10-CM | POA: Diagnosis not present

## 2022-11-19 DIAGNOSIS — Z880 Allergy status to penicillin: Secondary | ICD-10-CM | POA: Diagnosis not present

## 2022-11-19 DIAGNOSIS — Z7982 Long term (current) use of aspirin: Secondary | ICD-10-CM | POA: Diagnosis not present

## 2022-11-19 DIAGNOSIS — Z79899 Other long term (current) drug therapy: Secondary | ICD-10-CM | POA: Diagnosis not present

## 2022-11-19 LAB — BASIC METABOLIC PANEL
Anion gap: 10 (ref 5–15)
BUN: 25 mg/dL — ABNORMAL HIGH (ref 8–23)
CO2: 24 mmol/L (ref 22–32)
Calcium: 9.1 mg/dL (ref 8.9–10.3)
Chloride: 99 mmol/L (ref 98–111)
Creatinine, Ser: 1.1 mg/dL — ABNORMAL HIGH (ref 0.44–1.00)
GFR, Estimated: 54 mL/min — ABNORMAL LOW (ref >60)
Glucose, Bld: 179 mg/dL — ABNORMAL HIGH (ref 70–99)
Potassium: 4.2 mmol/L (ref 3.5–5.1)
Sodium: 133 mmol/L — ABNORMAL LOW (ref 135–145)

## 2022-11-19 LAB — CBC
HCT: 35.7 % — ABNORMAL LOW (ref 36.0–46.0)
Hemoglobin: 11.2 g/dL — ABNORMAL LOW (ref 12.0–15.0)
MCH: 28.3 pg (ref 26.0–34.0)
MCHC: 31.4 g/dL (ref 30.0–36.0)
MCV: 90.2 fL (ref 80.0–100.0)
Platelets: 213 10*3/uL (ref 150–400)
RBC: 3.96 MIL/uL (ref 3.87–5.11)
RDW: 13.6 % (ref 11.5–15.5)
WBC: 8.6 10*3/uL (ref 4.0–10.5)
nRBC: 0 % (ref 0.0–0.2)

## 2022-11-19 LAB — CYTOLOGY - NON PAP

## 2022-11-19 LAB — GLUCOSE, CAPILLARY
Glucose-Capillary: 129 mg/dL — ABNORMAL HIGH (ref 70–99)
Glucose-Capillary: 149 mg/dL — ABNORMAL HIGH (ref 70–99)

## 2022-11-19 NOTE — Plan of Care (Signed)
CHL Tonsillectomy/Adenoidectomy, Postoperative PEDS care plan entered in error.

## 2022-11-19 NOTE — Plan of Care (Signed)
  Problem: Education: Goal: Knowledge of General Education information will improve Description: Including pain rating scale, medication(s)/side effects and non-pharmacologic comfort measures Outcome: Adequate for Discharge   Problem: Health Behavior/Discharge Planning: Goal: Ability to manage health-related needs will improve Outcome: Adequate for Discharge   Problem: Clinical Measurements: Goal: Ability to maintain clinical measurements within normal limits will improve Outcome: Adequate for Discharge Goal: Will remain free from infection Outcome: Adequate for Discharge Goal: Diagnostic test results will improve Outcome: Adequate for Discharge Goal: Respiratory complications will improve Outcome: Adequate for Discharge Goal: Cardiovascular complication will be avoided Outcome: Adequate for Discharge   Problem: Activity: Goal: Risk for activity intolerance will decrease Outcome: Adequate for Discharge   Problem: Nutrition: Goal: Adequate nutrition will be maintained Outcome: Adequate for Discharge   Problem: Coping: Goal: Level of anxiety will decrease Outcome: Adequate for Discharge   Problem: Elimination: Goal: Will not experience complications related to bowel motility Outcome: Adequate for Discharge Goal: Will not experience complications related to urinary retention Outcome: Adequate for Discharge   Problem: Pain Managment: Goal: General experience of comfort will improve Outcome: Adequate for Discharge   Problem: Safety: Goal: Ability to remain free from injury will improve Outcome: Adequate for Discharge   Problem: Skin Integrity: Goal: Risk for impaired skin integrity will decrease Outcome: Adequate for Discharge   Problem: Education: Goal: Ability to describe self-care measures that may prevent or decrease complications (Diabetes Survival Skills Education) will improve Outcome: Adequate for Discharge Goal: Individualized Educational Video(s) Outcome:  Adequate for Discharge   Problem: Coping: Goal: Ability to adjust to condition or change in health will improve Outcome: Adequate for Discharge   Problem: Fluid Volume: Goal: Ability to maintain a balanced intake and output will improve Outcome: Adequate for Discharge   Problem: Health Behavior/Discharge Planning: Goal: Ability to identify and utilize available resources and services will improve Outcome: Adequate for Discharge Goal: Ability to manage health-related needs will improve Outcome: Adequate for Discharge   Problem: Metabolic: Goal: Ability to maintain appropriate glucose levels will improve Outcome: Adequate for Discharge   Problem: Nutritional: Goal: Maintenance of adequate nutrition will improve Outcome: Adequate for Discharge Goal: Progress toward achieving an optimal weight will improve Outcome: Adequate for Discharge   Problem: Skin Integrity: Goal: Risk for impaired skin integrity will decrease Outcome: Adequate for Discharge   Problem: Tissue Perfusion: Goal: Adequacy of tissue perfusion will improve Outcome: Adequate for Discharge   Problem: Education: Goal: Knowledge of the prescribed therapeutic regimen will improve Outcome: Adequate for Discharge Goal: Understanding of sexual limitations or changes related to disease process or condition will improve Outcome: Adequate for Discharge Goal: Individualized Educational Video(s) Outcome: Adequate for Discharge   Problem: Self-Concept: Goal: Communication of feelings regarding changes in body function or appearance will improve Outcome: Adequate for Discharge   Problem: Skin Integrity: Goal: Demonstration of wound healing without infection will improve Outcome: Adequate for Discharge   

## 2022-11-19 NOTE — Progress Notes (Signed)
   11/19/22 1225  TOC Brief Assessment  Insurance and Status Reviewed  Patient has primary care physician Yes  Home environment has been reviewed Resides with children  Prior level of function: Independent at baseline  Prior/Current Home Services No current home services  Social Determinants of Health Reivew SDOH reviewed no interventions necessary  Readmission risk has been reviewed Yes  Transition of care needs no transition of care needs at this time

## 2022-11-19 NOTE — Progress Notes (Signed)
AVS reviewed w/ pt and daughter- both verbalized an understanding, no other questions at this time. PIV x 2 removed as documented. Pt dressed and to main entrance via w/c.

## 2022-11-19 NOTE — Discharge Summary (Signed)
Physician Discharge Summary  Patient ID: Alice Hickman MRN: [id number] DOB/AGE: 07/27/1950 72 y.o.  Admit date: 11/18/2022 Discharge date: 11/19/2022  Admission Diagnoses: Endometrial cancer [facility])  Discharge Diagnoses:  Principal Problem:   Endometrial cancer [facility])   Discharged Condition:  The patient is in good condition and stable for discharge.    Hospital Course: On 11/18/2022, the patient underwent the following: Procedure(s): XI ROBOTIC ASSISTED TOTAL HYSTERECTOMY WITH BILATERAL SALPINGO OOPHORECTOMY WITH OMENTECTOMY, SENTINEL NODE BIOPSY, CYSTOSCOPY. The postoperative course was uneventful.  She was discharged to home on postoperative day 1 tolerating a regular diet, ambulating, pain controlled, voiding, vaginal packing removed with minimal vaginal bleeding.   Consults: None  Significant Diagnostic Studies: Labs  Treatments: Surgery:see above  Discharge Exam: Blood pressure 121/71, pulse 63, temperature 98 F (36.7 C), temperature source Oral, resp. rate 18, SpO2 100%. General appearance: alert, cooperative, and no distress Resp: clear to auscultation bilaterally Cardio: regular rate and rhythm, S1, S2 normal, no murmur, click, rub or gallop GI: soft, non-tender; bowel sounds normal; no masses,  no organomegaly Extremities: extremities normal, atraumatic, no cyanosis or edema Incision/Wound: Lap sites to the abdomen with dermabond intact without active drainage  Disposition: Discharge disposition: 01-Home or Self Care       Discharge Instructions     Call MD for:  difficulty breathing, headache or visual disturbances   Complete by: As directed    Call MD for:  extreme fatigue   Complete by: As directed    Call MD for:  hives   Complete by: As directed    Call MD for:  persistant dizziness or light-headedness   Complete by: As directed    Call MD for:  persistant nausea and vomiting   Complete by: As directed    Call MD for:  redness, tenderness, or  signs of infection (pain, swelling, redness, odor or green/yellow discharge around incision site)   Complete by: As directed    Call MD for:  severe uncontrolled pain   Complete by: As directed    Call MD for:  temperature >100.4   Complete by: As directed    Diet - low sodium heart healthy   Complete by: As directed    Driving Restrictions   Complete by: As directed    No driving for 1 week(s).  Do not take narcotics and drive. You need to make sure your reaction time has returned.   Increase activity slowly   Complete by: As directed    Lifting restrictions   Complete by: As directed    No lifting greater than 10 lbs, pushing, pulling, straining for 6 weeks.   Sexual Activity Restrictions   Complete by: As directed    No sexual activity, nothing in the vagina, for 10 weeks.      Allergies as of 11/19/2022       Reactions   Penicillins Other (See Comments)   Rash over whole body, difficulty breathing throat swelling   Lovastatin Other (See Comments)   REACTION: leg cramps   Metformin Nausea And Vomiting   REACTION: GI upset        Medication List     TAKE these medications    allopurinol 100 MG tablet Commonly known as: ZYLOPRIM Take 100 mg by mouth daily.   aspirin 81 MG chewable tablet Chew 81 mg by mouth daily.   Colchicine 0.6 MG Caps Take 0.6 mg by mouth 2 (two) times daily as needed (gout flare pain).   ferrous sulfate 325 (  65 FE) MG tablet Take 325 mg by mouth daily with breakfast.   furosemide 20 MG tablet Commonly known as: LASIX Take 1 tablet (20 mg total) by mouth as needed. What changed: when to take this   glimepiride 2 MG tablet Commonly known as: AMARYL TAKE ONE TABLET BY MOUTH ONCE DAILY BEFORE BREAKFAST   latanoprost 0.005 % ophthalmic solution Commonly known as: XALATAN Place 1 drop into both eyes at bedtime.   meloxicam 7.5 MG tablet Commonly known as: Mobic Take 1 tablet (7.5 mg total) by mouth daily. What changed:  when to  take this reasons to take this   olmesartan-hydrochlorothiazide 40-25 MG tablet Commonly known as: BENICAR HCT TAKE ONE TABLET BY MOUTH ONCE DAILY.   rosuvastatin 20 MG tablet Commonly known as: CRESTOR Take 1 tablet (20 mg total) by mouth daily.   senna-docusate 8.6-50 MG tablet Commonly known as: Senokot-S Take 2 tablets by mouth at bedtime. For AFTER surgery, do not take if having diarrhea   traMADol 50 MG tablet Commonly known as: ULTRAM Take 1 tablet (50 mg total) by mouth every 6 (six) hours as needed for severe pain. For AFTER surgery only, do not take and drive        Follow-up Information     Carver Fila, MD Follow up on 11/26/2022.   Specialty: Gynecologic Oncology Why: around 4:50pm will be a PHONE visit to check in and discuss pathology. IN PERSON visit will be on 12/17/22 at 1 pm at the [facility]. Contact information: [address] [place] [address] [phone]                 Greater than thirty minutes were spend for face to face discharge instructions and discharge orders/summary in EPIC.   Signed: Doylene Bode 11/19/2022, 3:07 PM

## 2022-11-22 ENCOUNTER — Telehealth: Payer: Self-pay | Admitting: *Deleted

## 2022-11-22 NOTE — Telephone Encounter (Signed)
Spoke with [employer] who called the office asking if her mother could use a laxative because she doesn't want to strain to have a bowel movement. Reiterated to Summer that her mother should continue to take Senokot-S at bed time and it's ok to increase to 2 tablets. Advised also to take Miralax 1 tablespoon in 6-8 oz.of water or juice. Also advised to call the office if patient doesn't have regular bowel movements with results from Miralax. Patient's daughter verbalized understanding.

## 2022-11-22 NOTE — Telephone Encounter (Signed)
Spoke with Ms. Morey's daughter Summer this morning. She states she is eating, drinking and urinating well. She has had a BM and is passing gas. She is taking senokot as prescribed and encouraged her to drink plenty of water. She denies fever or chills. Incisions are dry and intact. She rates her pain 4/10. Her pain is controlled with tylenol with occasional tramadol.     Instructed to call office with any fever, chills, purulent drainage, uncontrolled pain or any other questions or concerns. Patient verbalizes understanding.   Pt aware of post op appointments as well as the office number [phone] and after hours number [phone] to call if she has any questions or concerns

## 2022-11-23 ENCOUNTER — Telehealth: Payer: Self-pay | Admitting: *Deleted

## 2022-11-23 NOTE — Telephone Encounter (Signed)
Spoke with Patient's daughter Alice Hickman who called the office stating her Mom had a very small hard bowel movement and asked if she could take a laxative.   Reiterated to patient's daughter that Alice Hickman should continue to take her Senokot-S  2 at at bedtime and add Miralax 1 packet to 6-8 oz of water of juice twice daily. Pt could also try warmed prune juice mixed with apple juice as well. Alice Hickman verbalized understanding and advised the office would call tomorrow to check on patient's symptoms.

## 2022-11-23 NOTE — Interval H&P Note (Signed)
History and Physical Interval Note:  11/23/2022 1:52 PM  Alice Hickman  has presented today for surgery, with the diagnosis of ENDOMETRIAL CANCER.  The various methods of treatment have been discussed with the patient and family. After consideration of risks, benefits and other options for treatment, the patient has consented to  Procedure(s): XI ROBOTIC ASSISTED TOTAL HYSTERECTOMY WITH BILATERAL SALPINGO OOPHORECTOMY WITH OMENTECTOMY (N/A) SENTINEL NODE BIOPSY (N/A) CYSTOSCOPY as a surgical intervention.  The patient's history has been reviewed, patient examined, no change in status, stable for surgery.  I have reviewed the patient's chart and labs.  Questions were answered to the patient's satisfaction.     Carver Fila

## 2022-11-24 NOTE — Telephone Encounter (Signed)
Spoke with Ms. Carandang's daughter Summer who states her  mother is doing well and had a bowel movement yesterday. Reinforced to continue to take senokot-S two at bedtime and miralax when needed for constipation. Pt's daughter verbalized understanding and had no further concerns or questions at this time.

## 2022-11-25 ENCOUNTER — Encounter: Payer: Self-pay | Admitting: Gynecologic Oncology

## 2022-11-25 ENCOUNTER — Encounter: Payer: Self-pay | Admitting: Hematology and Oncology

## 2022-11-25 ENCOUNTER — Ambulatory Visit: Payer: Medicare HMO | Admitting: Gynecologic Oncology

## 2022-11-25 ENCOUNTER — Encounter: Payer: Self-pay | Admitting: Oncology

## 2022-11-25 ENCOUNTER — Telehealth: Payer: Self-pay | Admitting: Oncology

## 2022-11-25 DIAGNOSIS — C55 Malignant neoplasm of uterus, part unspecified: Secondary | ICD-10-CM

## 2022-11-25 DIAGNOSIS — Z7189 Other specified counseling: Secondary | ICD-10-CM

## 2022-11-25 DIAGNOSIS — C541 Malignant neoplasm of endometrium: Secondary | ICD-10-CM

## 2022-11-25 NOTE — Progress Notes (Signed)
Gynecologic Oncology Telehealth Note: Gyn-Onc  I connected with Alice Hickman on 11/25/22 at 12:00 PM EDT by telephone and verified that I am speaking with the correct person using two identifiers.  I discussed the limitations, risks, security and privacy concerns of performing an evaluation and management service by telemedicine and the availability of in-person appointments. I also discussed with the patient that there may be a patient responsible charge related to this service. The patient expressed understanding and agreed to proceed.  Other persons participating in the visit and their role in the encounter: Patient's daughter.  Patient's location: home Provider's location: [facility]  Reason for Visit: follow-up  Treatment History: Oncology History  Endometrial cancer (HCC)  10/06/2022 Initial Biopsy   Endometrial biopsy was performed on 9/4 and revealed uterine serous carcinoma.    10/28/2022 Imaging   CT C/A/P: 1. Abnormally thickened endometrium (27 mm thickness), compatible with known primary endometrial malignancy. 2. No evidence of metastatic disease in the chest, abdomen or pelvis. 3. Moderate diffuse colonic diverticulosis. 4. One vessel coronary atherosclerosis. 5.  Aortic Atherosclerosis (ICD10-I70.0).   11/18/2022 Initial Diagnosis   Endometrial cancer (HCC)   11/18/2022 Surgery   Robotic-assisted laparoscopic total hysterectomy with bilateral salpingoophorectomy, SLN biopsy bilaterally, omentectomy, cystoscopy, vaginal repair   Findings: On EUA, enlarged mobile uterus. On intra-abdominal entry, normal upper abodminal survey. Normal omentum, small and large bowel. Uterus 12 cm with 4-5 cm calcified anterior fundal fibroid, mildly bulbous. Normal apeparing bilateral adnexa. Mapping successful to bilateral obturator SLNs, no obvious adenopathy. Right sulcal and right sidewall vaginal lacerations due to vaginal delivery of specimen, repaired. On cystoscopy, intact dome, good  efflux from bilateral ureteral orifices.      Interval History: Doing well.  Soreness improving with each day.  Reports normal bowel function.  Denies any urinary symptoms or vaginal bleeding.  Past Medical/Surgical History: Past Medical History:  Diagnosis Date   ALLERGIC RHINITIS 05/01/2007   Allergy    Anemia    ANKLE PAIN, RIGHT 09/02/2009   ANXIETY, SITUATIONAL 09/02/2009   Cancer (HCC)    CARPAL TUNNEL SYNDROME, RIGHT 11/15/2006   Depression    DM w/o Complication Type II 11/15/2006   Generalized osteoarthritis 10/27/2012   bilat ankles, knees, hands and wrists   GERD 11/15/2006   GLAUCOMA 07/04/2008   Gout 11/14/2014   HYPERLIPIDEMIA 05/01/2007   HYPERTENSION 11/15/2006   HYPOKALEMIA 10/02/2008   Low back pain    OSTEOPENIA 09/02/2009   Peripheral neuropathy 11/14/2014   THUMB PAIN, RIGHT 04/02/2009    Past Surgical History:  Procedure Laterality Date   COLONOSCOPY     CYSTOSCOPY  11/18/2022   Procedure: CYSTOSCOPY;  Surgeon: Carver Fila, MD;  Location: [facility];  Service: Gynecology;;   NO PAST SURGERIES     ROBOTIC ASSISTED TOTAL HYSTERECTOMY WITH BILATERAL SALPINGO OOPHERECTOMY N/A 11/18/2022   Procedure: XI ROBOTIC ASSISTED TOTAL HYSTERECTOMY WITH BILATERAL SALPINGO OOPHORECTOMY WITH OMENTECTOMY;  Surgeon: Carver Fila, MD;  Location: [facility];  Service: Gynecology;  Laterality: N/A;   SENTINEL NODE BIOPSY N/A 11/18/2022   Procedure: SENTINEL NODE BIOPSY;  Surgeon: Carver Fila, MD;  Location: [facility];  Service: Gynecology;  Laterality: N/A;    Family History  Problem Relation Age of Onset   Heart disease Mother    Hypertension Mother    Cancer Brother        throat cancer, smoker   Cancer Brother    Breast cancer Granddaughter    Breast cancer Niece  Heart disease Other    Hypertension Other    Colon cancer Neg Hx    Colon polyps Neg Hx    Esophageal cancer Neg Hx    Stomach cancer Neg Hx    Rectal cancer Neg Hx     Social  History   Socioeconomic History   Marital status: Single    Spouse name: Not on file   Number of children: 3   Years of education: Not on file   Highest education level: Not on file  Occupational History   Occupation: FLOATER    Employer: [employer]   Occupation: Education officer, environmental, as well as Statistician   Occupation: retired  Tobacco Use   Smoking status: Never   Smokeless tobacco: Never  Vaping Use   Vaping status: Never Used  Substance and Sexual Activity   Alcohol use: No   Drug use: No   Sexual activity: Not on file  Other Topics Concern   Not on file  Social History Narrative   Not on file   Social Determinants of Health   Financial Resource Strain: Not on file  Food Insecurity: No Food Insecurity (11/18/2022)   Hunger Vital Sign    Worried About Running Out of Food in the Last Year: Never true    Ran Out of Food in the Last Year: Never true  Transportation Needs: No Transportation Needs (11/18/2022)   PRAPARE - Administrator, Civil Service (Medical): No    Lack of Transportation (Non-Medical): No  Physical Activity: Not on file  Stress: Not on file  Social Connections: Not on file    Current Medications:  Current Outpatient Medications:    allopurinol (ZYLOPRIM) 100 MG tablet, Take 100 mg by mouth daily., Disp: , Rfl:    aspirin 81 MG chewable tablet, Chew 81 mg by mouth daily. (Patient not taking: Reported on 11/04/2022), Disp: , Rfl:    Colchicine 0.6 MG CAPS, Take 0.6 mg by mouth 2 (two) times daily as needed (gout flare pain)., Disp: , Rfl:    ferrous sulfate 325 (65 FE) MG tablet, Take 325 mg by mouth daily with breakfast., Disp: , Rfl:    furosemide (LASIX) 20 MG tablet, Take 1 tablet (20 mg total) by mouth as needed. (Patient taking differently: Take 20 mg by mouth daily.), Disp: 60 tablet, Rfl: 2   glimepiride (AMARYL) 2 MG tablet, TAKE ONE TABLET BY MOUTH ONCE DAILY BEFORE BREAKFAST, Disp: 90 tablet, Rfl: 0   latanoprost (XALATAN)  0.005 % ophthalmic solution, Place 1 drop into both eyes at bedtime. (Patient not taking: Reported on 11/04/2022), Disp: , Rfl:    meloxicam (MOBIC) 7.5 MG tablet, Take 1 tablet (7.5 mg total) by mouth daily. (Patient taking differently: Take 7.5 mg by mouth daily as needed for pain.), Disp: 30 tablet, Rfl: 0   olmesartan-hydrochlorothiazide (BENICAR HCT) 40-25 MG tablet, TAKE ONE TABLET BY MOUTH ONCE DAILY. (Patient taking differently: Take 1 tablet by mouth daily.), Disp: 30 tablet, Rfl: 5   rosuvastatin (CRESTOR) 20 MG tablet, Take 1 tablet (20 mg total) by mouth daily., Disp: 90 tablet, Rfl: 3   senna-docusate (SENOKOT-S) 8.6-50 MG tablet, Take 2 tablets by mouth at bedtime. For AFTER surgery, do not take if having diarrhea, Disp: 30 tablet, Rfl: 0   traMADol (ULTRAM) 50 MG tablet, Take 1 tablet (50 mg total) by mouth every 6 (six) hours as needed for severe pain. For AFTER surgery only, do not take and drive, Disp: 10 tablet, Rfl:  0  Review of Symptoms: Pertinent positives as per HPI.  Physical Exam: Deferred given limitations of phone visit.  Laboratory & Radiologic Studies: A. RIGHT OBTURATOR SENTINEL LYMPH NODE, BIOPSY: - Lymph node, negative for carcinoma (0/1)  B. LEFT OBTURATOR SENTINEL LYMPH NODE BIOPSY: - Lymph node, negative for carcinoma (0/1)  C. UTERUS, CERVIX, BILATERAL FALLOPIAN TUBES AND OVARIES, RESECTION: - High-grade serous carcinoma, presenting as 3 lesions measuring 1 cm, 2.9 cm and 4.6 cm - Carcinoma at the 4.6 cm lesion invades for a depth of 1.8 cm where myometrial thickness is 2.3 cm (78%) - Cervical stroma is not involved - Benign bilateral ovaries and fallopian tubes - No evidence of lymphovascular invasion - Benign leiomyomata - See oncology table  D. OMENTUM: - Portion of omentum, negative for carcinoma     ONCOLOGY TABLE:  UTERUS, CARCINOMA OR CARCINOSARCOMA: Resection  Procedure: Total hysterectomy and bilateral  salpingo-oophorectomy Histologic Type: Serous carcinoma Histologic Grade: High-grade Myometrial Invasion:      Depth of Myometrial Invasion (mm): 18 mm      Myometrial Thickness (mm): 23 mm      Percentage of Myometrial Invasion: 78% Uterine Serosa Involvement: Not identified Cervical stromal Involvement: Not identified Extent of involvement of other tissue/organs: Not identified Peritoneal/Ascitic Fluid: Negative for carcinoma Lymphovascular Invasion: Not identified Regional Lymph Nodes:      Pelvic Lymph Nodes Examined:                                  2 Sentinel                                  0 Non-sentinel                                  2 Total      Pelvic Lymph Nodes with Metastasis: 0                          Macrometastasis: (>2.0 mm): 0                          Micrometastasis: (>0.2 mm and < 2.0 mm): 0                          Isolated Tumor Cells (<0.2 mm): 0                          Laterality of Lymph Node with Tumor: Not applicable                          Extracapsular Extension: Not applicable      Para-aortic Lymph Nodes Examined:                                   0 Sentinel                                   0 non-sentinel  0 total Distant Metastasis:      Distant Site(s) Involved: Not applicable Pathologic Stage Classification (pTNM, AJCC 8th Edition): pT1b, pN0 Ancillary Studies: MMR testing will be ordered Representative Tumor Block: C5 Comment(s): Pancytokeratin was performed on the lymph nodes and is negative.   Assessment & Plan: Alice Hickman is a 72 y.o. woman with Stage IIC uterine serous carcinoma who presents for phone follow-up. MMRp.  Patient is overall doing well, meeting postoperative milestones.  Discussed continued expectations and restrictions.  Reviewed pathology with the patient's daughter who relayed this message to her mother.  Although confined to the uterus, given high risk histology, discussed  recommendation for adjuvant treatment including chemotherapy and vaginal brachytherapy.  Recommended referral to both radiation oncology and medical oncology.  They are amenable.  Message sent to Clydie Braun, our nurse navigator, to facilitate appointments being scheduled.  I discussed the assessment and treatment plan with the patient. The patient was provided with an opportunity to ask questions and all were answered. The patient agreed with the plan and demonstrated an understanding of the instructions.   The patient was advised to call back or see an in-person evaluation if the symptoms worsen or if the condition fails to improve as anticipated.   6 minutes of total time was spent for this patient encounter, including preparation, phone counseling with the patient and coordination of care, and documentation of the encounter.   Eugene Garnet, MD  Division of Gynecologic Oncology  Department of Obstetrics and Gynecology  [facility]

## 2022-11-25 NOTE — Telephone Encounter (Signed)
Called Tinea and let her know about the new patient appointment with Dr. Bertis Ruddy on 11/30/22 at 1:40 with arrival to check in at 1:10. She verbalized understanding and agreement.

## 2022-11-26 ENCOUNTER — Telehealth: Payer: Medicare HMO | Admitting: Gynecologic Oncology

## 2022-11-30 ENCOUNTER — Encounter: Payer: Self-pay | Admitting: Hematology and Oncology

## 2022-11-30 ENCOUNTER — Inpatient Hospital Stay: Payer: Medicare HMO | Attending: Gynecologic Oncology | Admitting: Hematology and Oncology

## 2022-11-30 VITALS — BP 133/54 | HR 79 | Temp 97.4°F | Resp 18 | Ht 61.0 in | Wt 176.6 lb

## 2022-11-30 DIAGNOSIS — E1140 Type 2 diabetes mellitus with diabetic neuropathy, unspecified: Secondary | ICD-10-CM | POA: Diagnosis not present

## 2022-11-30 DIAGNOSIS — Z800 Family history of malignant neoplasm of digestive organs: Secondary | ICD-10-CM | POA: Diagnosis not present

## 2022-11-30 DIAGNOSIS — C541 Malignant neoplasm of endometrium: Secondary | ICD-10-CM

## 2022-11-30 DIAGNOSIS — Z803 Family history of malignant neoplasm of breast: Secondary | ICD-10-CM | POA: Insufficient documentation

## 2022-11-30 DIAGNOSIS — K5909 Other constipation: Secondary | ICD-10-CM | POA: Diagnosis not present

## 2022-11-30 MED ORDER — ONDANSETRON HCL 8 MG PO TABS: 8.0000 mg | ORAL_TABLET | Freq: Three times a day (TID) | ORAL | 1 refills | Status: DC | PRN

## 2022-11-30 MED ORDER — LIDOCAINE-PRILOCAINE 2.5-2.5 % EX CREA: TOPICAL_CREAM | CUTANEOUS | 3 refills | Status: DC

## 2022-11-30 MED ORDER — DEXAMETHASONE 4 MG PO TABS: ORAL_TABLET | ORAL | 6 refills | Status: DC

## 2022-11-30 MED ORDER — PROCHLORPERAZINE MALEATE 10 MG PO TABS: 10.0000 mg | ORAL_TABLET | Freq: Four times a day (QID) | ORAL | 1 refills | Status: DC | PRN

## 2022-11-30 NOTE — Assessment & Plan Note (Signed)
I warned her about risk of constipation while on treatment

## 2022-11-30 NOTE — Progress Notes (Signed)
[facility] CONSULT NOTE  Patient Care Team: Diamantina Providence, FNP as PCP - General (Nurse Practitioner)  ASSESSMENT & PLAN:  Endometrial cancer [facility]) We reviewed the NCCN guidelines We discussed the role of chemotherapy. The intent is of curative intent.  We discussed some of the risks, benefits, side-effects of carboplatin & Taxol. Treatment is intravenous, every 3 weeks x 6 cycles  Some of the short term side-effects included, though not limited to, including weight loss, life threatening infections, risk of allergic reactions, need for transfusions of blood products, nausea, vomiting, change in bowel habits, loss of hair, admission to hospital for various reasons, and risks of death.   Long term side-effects are also discussed including risks of infertility, permanent damage to nerve function, hearing loss, chronic fatigue, kidney damage with possibility needing hemodialysis, and rare secondary malignancy including bone marrow disorders.  The patient is aware that the response rates discussed earlier is not guaranteed.  After a long discussion, patient made an informed decision to proceed with the prescribed plan of care.   Patient education material was dispensed. We discussed premedication with dexamethasone before chemotherapy. Recommend port placement, chemo education class and blood work Will get her started on chemotherapy next month Due to high calculated dose of chemotherapy, I will reduce her paclitaxel dose upfront  Diabetes mellitus with neuropathy She could be at risk of severe hypoglycemia during treatment I recommend dietary modification while on treatment  Chronic constipation I warned her about risk of constipation while on treatment  Orders Placed This Encounter  Procedures   IR IMAGING GUIDED PORT INSERTION    Standing Status:   Future    Standing Expiration Date:   11/30/2023    Order Specific Question:   Reason for Exam (SYMPTOM  OR  DIAGNOSIS REQUIRED)    Answer:   need port for chemo to start 11/15    Order Specific Question:   Preferred Imaging Location?    Answer:   [facility]   CBC with Differential (Cancer Center Only)    Standing Status:   Future    Standing Expiration Date:   12/17/2023   CMP (Cancer Center only)    Standing Status:   Future    Standing Expiration Date:   12/17/2023   CBC with Differential (Cancer Center Only)    Standing Status:   Future    Standing Expiration Date:   01/07/2024   CMP (Cancer Center only)    Standing Status:   Future    Standing Expiration Date:   01/07/2024   CBC with Differential (Cancer Center Only)    Standing Status:   Future    Standing Expiration Date:   02/04/2024   CMP (Cancer Center only)    Standing Status:   Future    Standing Expiration Date:   02/04/2024   CBC with Differential (Cancer Center Only)    Standing Status:   Future    Standing Expiration Date:   02/25/2024   CMP (Cancer Center only)    Standing Status:   Future    Standing Expiration Date:   02/25/2024   CBC with Differential (Cancer Center Only)    Standing Status:   Future    Standing Expiration Date:   03/17/2024   CMP (Cancer Center only)    Standing Status:   Future    Standing Expiration Date:   03/17/2024   CBC with Differential (Cancer Center Only)    Standing Status:   Future  Standing Expiration Date:   04/07/2024   CMP (Cancer Center only)    Standing Status:   Future    Standing Expiration Date:   04/07/2024   CBC with Differential (Cancer Center Only)    Standing Status:   Future    Standing Expiration Date:   11/30/2023   CMP (Cancer Center only)    Standing Status:   Future    Standing Expiration Date:   11/30/2023    The total time spent in the appointment was 60 minutes encounter with patients including review of chart and various tests results, discussions about plan of care and coordination of care plan   All questions were answered. The patient knows to call  the clinic with any problems, questions or concerns. No barriers to learning was detected.  Artis Delay, MD 10/29/20243:05 PM  CHIEF COMPLAINTS/PURPOSE OF CONSULTATION:  Uterine cancer, for adjuvant treatment  HISTORY OF PRESENTING ILLNESS:  BREAN CALBERT 72 y.o. female is here because of recent findings of urine cancer She is here accompanied by her daughter She is recovering well from surgery  I have reviewed her chart and materials related to her cancer extensively and collaborated history with the patient. 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    10/28/2022 Imaging   CT C/A/P: 1. Abnormally thickened endometrium (27 mm thickness), compatible with known primary endometrial malignancy. 2. No evidence of metastatic disease in the chest, abdomen or pelvis. 3. Moderate diffuse colonic diverticulosis. 4. One vessel coronary atherosclerosis. 5.  Aortic Atherosclerosis (ICD10-I70.0).   11/18/2022 Initial Diagnosis   Endometrial cancer (HCC)   11/18/2022 Surgery   Robotic-assisted laparoscopic total hysterectomy with bilateral salpingoophorectomy, SLN biopsy bilaterally, omentectomy, cystoscopy, vaginal repair   Findings: On EUA, enlarged mobile uterus. On intra-abdominal entry, normal upper abodminal survey. Normal omentum, small and large bowel. Uterus 12 cm with 4-5 cm calcified anterior fundal fibroid, mildly bulbous. Normal apeparing bilateral adnexa. Mapping successful to bilateral obturator SLNs, no obvious adenopathy. Right sulcal and right sidewall vaginal lacerations due to vaginal delivery of specimen, repaired. On cystoscopy, intact dome, good efflux from bilateral ureteral orifices.    11/25/2022 Cancer Staging   Staging form: Corpus Uteri - Carcinoma and Carcinosarcoma,  AJCC 8th Edition - Pathologic stage from 11/25/2022: FIGO Stage II, calculated as Stage IB (pT1b, pN0, cM0) - Signed by Artis Delay, MD on 11/25/2022 Stage prefix: Initial diagnosis   12/17/2022 -  Chemotherapy   Patient is on Treatment Plan : UTERINE Carboplatin AUC 6 + Paclitaxel q21d       MEDICAL HISTORY:  Past Medical History:  Diagnosis Date   ALLERGIC RHINITIS 05/01/2007   Allergy    Anemia    ANKLE PAIN, RIGHT 09/02/2009   ANXIETY, SITUATIONAL 09/02/2009   Cancer (HCC)    CARPAL TUNNEL SYNDROME, RIGHT 11/15/2006   Depression    DM w/o Complication Type II 11/15/2006   Generalized osteoarthritis 10/27/2012   bilat ankles, knees, hands and wrists   GERD 11/15/2006   GLAUCOMA 07/04/2008   Gout 11/14/2014   HYPERLIPIDEMIA 05/01/2007   HYPERTENSION 11/15/2006   HYPOKALEMIA 10/02/2008   Low back pain    OSTEOPENIA 09/02/2009   Peripheral neuropathy 11/14/2014   THUMB PAIN, RIGHT 04/02/2009    SURGICAL HISTORY: Past Surgical History:  Procedure Laterality Date   COLONOSCOPY  CYSTOSCOPY  11/18/2022   Procedure: CYSTOSCOPY;  Surgeon: Carver Fila, MD;  Location: [facility];  Service: Gynecology;;   NO PAST SURGERIES     ROBOTIC ASSISTED TOTAL HYSTERECTOMY WITH BILATERAL SALPINGO OOPHERECTOMY N/A 11/18/2022   Procedure: XI ROBOTIC ASSISTED TOTAL HYSTERECTOMY WITH BILATERAL SALPINGO OOPHORECTOMY WITH OMENTECTOMY;  Surgeon: Carver Fila, MD;  Location: [facility];  Service: Gynecology;  Laterality: N/A;   SENTINEL NODE BIOPSY N/A 11/18/2022   Procedure: SENTINEL NODE BIOPSY;  Surgeon: Carver Fila, MD;  Location: [facility];  Service: Gynecology;  Laterality: N/A;    SOCIAL HISTORY: Social History   Socioeconomic History   Marital status: Single    Spouse name: Not on file   Number of children: 3   Years of education: Not on file   Highest education level: Not on file  Occupational History   Occupation: FLOATER    Employer: [employer]    Occupation: Education officer, environmental, as well as Statistician   Occupation: retired  Tobacco Use   Smoking status: Never   Smokeless tobacco: Never  Vaping Use   Vaping status: Never Used  Substance and Sexual Activity   Alcohol use: No   Drug use: No   Sexual activity: Not on file  Other Topics Concern   Not on file  Social History Narrative   Not on file   Social Determinants of Health   Financial Resource Strain: Not on file  Food Insecurity: No Food Insecurity (11/18/2022)   Hunger Vital Sign    Worried About Running Out of Food in the Last Year: Never true    Ran Out of Food in the Last Year: Never true  Transportation Needs: No Transportation Needs (11/18/2022)   PRAPARE - Administrator, Civil Service (Medical): No    Lack of Transportation (Non-Medical): No  Physical Activity: Not on file  Stress: Not on file  Social Connections: Not on file  Intimate Partner Violence: Not At Risk (11/18/2022)   Humiliation, Afraid, Rape, and Kick questionnaire    Fear of Current or Ex-Partner: No    Emotionally Abused: No    Physically Abused: No    Sexually Abused: No    FAMILY HISTORY: Family History  Problem Relation Age of Onset   Heart disease Mother    Hypertension Mother    Cancer Brother        throat cancer, smoker   Cancer Brother    Breast cancer Granddaughter    Breast cancer Niece    Heart disease Other    Hypertension Other    Colon cancer Neg Hx    Colon polyps Neg Hx    Esophageal cancer Neg Hx    Stomach cancer Neg Hx    Rectal cancer Neg Hx     ALLERGIES:  is allergic to penicillins, lovastatin, and metformin.  MEDICATIONS:  Current Outpatient Medications  Medication Sig Dispense Refill   dexamethasone (DECADRON) 4 MG tablet Take 2 tabs at the night before and 2 tab the morning of chemotherapy, every 3 weeks, by mouth x 6 cycles 24 tablet 6   allopurinol (ZYLOPRIM) 100 MG tablet Take 100 mg by mouth daily.     aspirin 81 MG chewable tablet Chew  81 mg by mouth daily. (Patient not taking: Reported on 11/04/2022)     Colchicine 0.6 MG CAPS Take 0.6 mg by mouth 2 (two) times daily as needed (gout flare pain).     ferrous sulfate 325 (65 FE) MG tablet  Take 325 mg by mouth daily with breakfast.     furosemide (LASIX) 20 MG tablet Take 1 tablet (20 mg total) by mouth as needed. (Patient taking differently: Take 20 mg by mouth daily.) 60 tablet 2   glimepiride (AMARYL) 2 MG tablet TAKE ONE TABLET BY MOUTH ONCE DAILY BEFORE BREAKFAST 90 tablet 0   latanoprost (XALATAN) 0.005 % ophthalmic solution Place 1 drop into both eyes at bedtime. (Patient not taking: Reported on 11/04/2022)     [START ON 12/16/2022] lidocaine-prilocaine (EMLA) cream Apply to affected area once 30 g 3   meloxicam (MOBIC) 7.5 MG tablet Take 1 tablet (7.5 mg total) by mouth daily. (Patient taking differently: Take 7.5 mg by mouth daily as needed for pain.) 30 tablet 0   olmesartan-hydrochlorothiazide (BENICAR HCT) 40-25 MG tablet TAKE ONE TABLET BY MOUTH ONCE DAILY. (Patient taking differently: Take 1 tablet by mouth daily.) 30 tablet 5   [START ON 12/16/2022] ondansetron (ZOFRAN) 8 MG tablet Take 1 tablet (8 mg total) by mouth every 8 (eight) hours as needed for nausea or vomiting. Start on the third day after chemotherapy. 30 tablet 1   [START ON 12/16/2022] prochlorperazine (COMPAZINE) 10 MG tablet Take 1 tablet (10 mg total) by mouth every 6 (six) hours as needed for nausea or vomiting. 30 tablet 1   rosuvastatin (CRESTOR) 20 MG tablet Take 1 tablet (20 mg total) by mouth daily. 90 tablet 3   senna-docusate (SENOKOT-S) 8.6-50 MG tablet Take 2 tablets by mouth at bedtime. For AFTER surgery, do not take if having diarrhea 30 tablet 0   traMADol (ULTRAM) 50 MG tablet Take 1 tablet (50 mg total) by mouth every 6 (six) hours as needed for severe pain. For AFTER surgery only, do not take and drive 10 tablet 0   No current facility-administered medications for this visit.    REVIEW  OF SYSTEMS:   Constitutional: Denies fevers, chills or abnormal night sweats Eyes: Denies blurriness of vision, double vision or watery eyes Ears, nose, mouth, throat, and face: Denies mucositis or sore throat Respiratory: Denies cough, dyspnea or wheezes Cardiovascular: Denies palpitation, chest discomfort or lower extremity swelling Gastrointestinal:  Denies nausea, heartburn or change in bowel habits Skin: Denies abnormal skin rashes Lymphatics: Denies new lymphadenopathy or easy bruising Neurological:Denies numbness, tingling or new weaknesses Behavioral/Psych: Mood is stable, no new changes  All other systems were reviewed with the patient and are negative.  PHYSICAL EXAMINATION: ECOG PERFORMANCE STATUS: 1 - Symptomatic but completely ambulatory  Vitals:   11/30/22 1341  BP: (!) 133/54  Pulse: 79  Resp: 18  Temp: (!) 97.4 F (36.3 C)  SpO2: 100%   Filed Weights   11/30/22 1341  Weight: 176 lb 9.6 oz (80.1 kg)    GENERAL:alert, no distress and comfortable SKIN: skin color, texture, turgor are normal, no rashes or significant lesions EYES: normal, conjunctiva are pink and non-injected, sclera clear OROPHARYNX:no exudate, no erythema and lips, buccal mucosa, and tongue normal  NECK: supple, thyroid normal size, non-tender, without nodularity LYMPH:  no palpable lymphadenopathy in the cervical, axillary or inguinal LUNGS: clear to auscultation and percussion with normal breathing effort HEART: regular rate & rhythm and no murmurs and no lower extremity edema ABDOMEN:abdomen soft, non-tender and normal bowel sounds.  Noted well-healed surgical scar Musculoskeletal:no cyanosis of digits and no clubbing  PSYCH: alert & oriented x 3 with fluent speech NEURO: no focal motor/sensory deficits  LABORATORY DATA:  I have reviewed the data as listed Lab Results  Component Value Date   WBC 8.6 11/19/2022   HGB 11.2 (L) 11/19/2022   HCT 35.7 (L) 11/19/2022   MCV 90.2 11/19/2022    PLT 213 11/19/2022   Recent Labs    10/28/22 1553 10/29/22 1122 11/19/22 0434  NA  --  140 133*  K  --  4.1 4.2  CL  --  102 99  CO2  --  27 24  GLUCOSE  --  131* 179*  BUN  --  20 25*  CREATININE 1.30* 1.07* 1.10*  CALCIUM  --  9.5 9.1  GFRNONAA  --  56* 54*  PROT  --  7.2  --   ALBUMIN  --  3.6  --   AST  --  16  --   ALT  --  14  --   ALKPHOS  --  43  --   BILITOT  --  0.8  --

## 2022-11-30 NOTE — Progress Notes (Signed)
START ON PATHWAY REGIMEN - Uterine     A cycle is every 21 days:     Paclitaxel      Carboplatin   **Always confirm dose/schedule in your pharmacy ordering system**  Patient Characteristics: Serous Carcinoma, Newly Diagnosed, Postoperative (Pathologic Staging), Stage I/II Histology: Serous Carcinoma Therapeutic Status: Newly Diagnosed, Postoperative (Pathologic Staging) AJCC M Category: cM0 AJCC 8 Stage Grouping: IB AJCC T Category: pT1b AJCC N Category: pN0 Intent of Therapy: Curative Intent, Discussed with Patient

## 2022-11-30 NOTE — Assessment & Plan Note (Signed)
She could be at risk of severe hypoglycemia during treatment I recommend dietary modification while on treatment

## 2022-11-30 NOTE — Assessment & Plan Note (Signed)
We reviewed the NCCN guidelines We discussed the role of chemotherapy. The intent is of curative intent.  We discussed some of the risks, benefits, side-effects of carboplatin & Taxol. Treatment is intravenous, every 3 weeks x 6 cycles  Some of the short term side-effects included, though not limited to, including weight loss, life threatening infections, risk of allergic reactions, need for transfusions of blood products, nausea, vomiting, change in bowel habits, loss of hair, admission to hospital for various reasons, and risks of death.   Long term side-effects are also discussed including risks of infertility, permanent damage to nerve function, hearing loss, chronic fatigue, kidney damage with possibility needing hemodialysis, and rare secondary malignancy including bone marrow disorders.  The patient is aware that the response rates discussed earlier is not guaranteed.  After a long discussion, patient made an informed decision to proceed with the prescribed plan of care.   Patient education material was dispensed. We discussed premedication with dexamethasone before chemotherapy. Recommend port placement, chemo education class and blood work Will get her started on chemotherapy next month Due to high calculated dose of chemotherapy, I will reduce her paclitaxel dose upfront

## 2022-12-02 ENCOUNTER — Other Ambulatory Visit: Payer: Self-pay

## 2022-12-02 NOTE — Progress Notes (Signed)
GYN Location of Tumor / Histology: Endometrial  Alice Hickman presented with symptoms of: bleeding  Biopsies revealed:    Past/Anticipated interventions by Gyn/Onc surgery, if any: Dr. Pricilla Holm   Past/Anticipated interventions by medical oncology, if any: Dr. Bertis Ruddy    Weight changes, if any: no  Bowel/Bladder complaints, if any: No.,      Nausea/Vomiting, if any: no  Pain issues, if any:  no  SAFETY ISSUES: Prior radiation? no Pacemaker/ICD? no Possible current pregnancy? no Is the patient on methotrexate? no  Current Complaints / other details:  Patient reports vaginal itching.    BP (!) 124/54 (BP Location: Right Arm, Patient Position: Sitting, Cuff Size: Large)   Pulse 78   Temp 97.7 F (36.5 C)   Resp 20   Ht 5\' 1"  (1.549 m)   Wt 175 lb 9.6 oz (79.7 kg)   SpO2 100%   BMI 33.18 kg/m

## 2022-12-03 NOTE — Progress Notes (Signed)
Radiation Oncology         [phone] ________________________________  Initial Outpatient Consultation  Name: Alice Hickman MRN: [id number]  Date: 12/06/2022  DOB: 1950/11/24  CC:Diamantina Providence, FNP  Carver Fila, MD   REFERRING PHYSICIAN: Carver Fila, MD  DIAGNOSIS: {There were no encounter diagnoses. (Refresh or delete this SmartLink)}  {diagnosis}  HISTORY OF PRESENT ILLNESS::Alice Hickman is a 72 y.o. female who is accompanied by ***. she is seen as a courtesy of *** for an opinion concerning radiation therapy as part of management for her recently diagnosed ***.   She presented to her OB/GYN on 09/17/22 with c/o postmenopausal bleeding x 3 days.   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 Complication Type II 11/15/2006   Generalized osteoarthritis 10/27/2012   bilat ankles, knees, hands and wrists   GERD 11/15/2006   GLAUCOMA 07/04/2008   Gout 11/14/2014   HYPERLIPIDEMIA 05/01/2007   HYPERTENSION 11/15/2006   HYPOKALEMIA 10/02/2008   Low back pain    OSTEOPENIA 09/02/2009   Peripheral neuropathy 11/14/2014   THUMB PAIN, RIGHT 04/02/2009    PAST SURGICAL HISTORY: Past Surgical History:  Procedure Laterality Date   COLONOSCOPY     CYSTOSCOPY  11/18/2022   Procedure: CYSTOSCOPY;  Surgeon: Carver Fila, MD;  Location: [facility];  Service: Gynecology;;   NO PAST SURGERIES     ROBOTIC ASSISTED TOTAL HYSTERECTOMY WITH BILATERAL SALPINGO OOPHERECTOMY N/A 11/18/2022   Procedure: XI ROBOTIC ASSISTED TOTAL HYSTERECTOMY WITH BILATERAL SALPINGO OOPHORECTOMY WITH OMENTECTOMY;  Surgeon: Carver Fila, MD;  Location: [facility];  Service: Gynecology;  Laterality: N/A;   SENTINEL NODE BIOPSY N/A 11/18/2022   Procedure: SENTINEL NODE BIOPSY;  Surgeon: Carver Fila, MD;  Location: [facility];  Service: Gynecology;  Laterality: N/A;    FAMILY HISTORY:  Family History  Problem Relation Age of Onset   Heart disease Mother    Hypertension Mother    Cancer Brother        throat cancer, smoker   Cancer Brother    Breast cancer Granddaughter    Breast cancer Niece    Heart disease Other    Hypertension Other    Colon cancer Neg Hx    Colon polyps Neg Hx    Esophageal cancer Neg Hx    Stomach cancer Neg Hx    Rectal cancer Neg Hx     SOCIAL HISTORY:  Social History   Tobacco Use   Smoking status: Never   Smokeless tobacco: Never  Vaping Use   Vaping status: Never Used  Substance Use Topics   Alcohol use: No   Drug use: No    ALLERGIES:  Allergies  Allergen Reactions   Penicillins Other (See Comments)    Rash over whole body, difficulty breathing throat swelling   Lovastatin Other (See Comments)    REACTION: leg cramps   Metformin Nausea And Vomiting    REACTION: GI upset    MEDICATIONS:  Current Outpatient Medications  Medication Sig Dispense Refill   allopurinol (ZYLOPRIM) 100 MG tablet Take 100 mg by mouth daily.  aspirin 81 MG chewable tablet Chew 81 mg by mouth daily. (Patient not taking: Reported on 11/04/2022)     Colchicine 0.6 MG CAPS Take 0.6 mg by mouth 2 (two) times daily as needed (gout flare pain).     dexamethasone (DECADRON) 4 MG tablet Take 2 tabs at the night before and 2 tab the morning of chemotherapy, every 3 weeks, by mouth x 6 cycles 24 tablet 6   ferrous sulfate 325 (65 FE) MG tablet Take 325 mg by mouth daily with breakfast.     furosemide (LASIX) 20 MG tablet Take 1 tablet (20 mg total) by mouth as needed. (Patient taking differently: Take 20 mg by mouth daily.) 60 tablet 2   glimepiride (AMARYL) 2 MG tablet TAKE ONE TABLET BY  MOUTH ONCE DAILY BEFORE BREAKFAST 90 tablet 0   latanoprost (XALATAN) 0.005 % ophthalmic solution Place 1 drop into both eyes at bedtime. (Patient not taking: Reported on 11/04/2022)     [START ON 12/16/2022] lidocaine-prilocaine (EMLA) cream Apply to affected area once 30 g 3   meloxicam (MOBIC) 7.5 MG tablet Take 1 tablet (7.5 mg total) by mouth daily. (Patient taking differently: Take 7.5 mg by mouth daily as needed for pain.) 30 tablet 0   olmesartan-hydrochlorothiazide (BENICAR HCT) 40-25 MG tablet TAKE ONE TABLET BY MOUTH ONCE DAILY. (Patient taking differently: Take 1 tablet by mouth daily.) 30 tablet 5   [START ON 12/16/2022] ondansetron (ZOFRAN) 8 MG tablet Take 1 tablet (8 mg total) by mouth every 8 (eight) hours as needed for nausea or vomiting. Start on the third day after chemotherapy. 30 tablet 1   [START ON 12/16/2022] prochlorperazine (COMPAZINE) 10 MG tablet Take 1 tablet (10 mg total) by mouth every 6 (six) hours as needed for nausea or vomiting. 30 tablet 1   rosuvastatin (CRESTOR) 20 MG tablet Take 1 tablet (20 mg total) by mouth daily. 90 tablet 3   senna-docusate (SENOKOT-S) 8.6-50 MG tablet Take 2 tablets by mouth at bedtime. For AFTER surgery, do not take if having diarrhea 30 tablet 0   traMADol (ULTRAM) 50 MG tablet Take 1 tablet (50 mg total) by mouth every 6 (six) hours as needed for severe pain. For AFTER surgery only, do not take and drive 10 tablet 0   No current facility-administered medications for this encounter.    REVIEW OF SYSTEMS:  A 10+ POINT REVIEW OF SYSTEMS WAS OBTAINED including neurology, dermatology, psychiatry, cardiac, respiratory, lymph, extremities, GI, GU, musculoskeletal, constitutional, reproductive, HEENT. ***   PHYSICAL EXAM:  vitals were not taken for this visit.   General: Alert and oriented, in no acute distress HEENT: Head is normocephalic. Extraocular movements are intact. Oropharynx is clear. Neck: Neck is supple, no palpable cervical or  supraclavicular lymphadenopathy. Heart: Regular in rate and rhythm with no murmurs, rubs, or gallops. Chest: Clear to auscultation bilaterally, with no rhonchi, wheezes, or rales. Abdomen: Soft, nontender, nondistended, with no rigidity or guarding. Extremities: No cyanosis or edema. Lymphatics: see Neck Exam Skin: No concerning lesions. Musculoskeletal: symmetric strength and muscle tone throughout. Neurologic: Cranial nerves II through XII are grossly intact. No obvious focalities. Speech is fluent. Coordination is intact. Psychiatric: Judgment and insight are intact. Affect is appropriate. ***  ECOG = ***  0 - Asymptomatic (Fully active, able to carry on all predisease activities without restriction)  1 - Symptomatic but completely ambulatory (Restricted in physically strenuous activity but ambulatory and able to carry out work of a light or sedentary nature. For example,  light housework, office work)  2 - Symptomatic, <50% in bed during the day (Ambulatory and capable of all self care but unable to carry out any work activities. Up and about more than 50% of waking hours)  3 - Symptomatic, >50% in bed, but not bedbound (Capable of only limited self-care, confined to bed or chair 50% or more of waking hours)  4 - Bedbound (Completely disabled. Cannot carry on any self-care. Totally confined to bed or chair)  5 - Death   Santiago Glad MM, Creech RH, Tormey DC, et al. [id number]). "Toxicity and response criteria of the [facility]". Am. Evlyn Clines. Oncol. 5 (6): 649-55  LABORATORY DATA:  Lab Results  Component Value Date   WBC 8.6 11/19/2022   HGB 11.2 (L) 11/19/2022   HCT 35.7 (L) 11/19/2022   MCV 90.2 11/19/2022   PLT 213 11/19/2022   NEUTROABS 4.4 07/01/2018   Lab Results  Component Value Date   NA 133 (L) 11/19/2022   K 4.2 11/19/2022   CL 99 11/19/2022   CO2 24 11/19/2022   GLUCOSE 179 (H) 11/19/2022   BUN 25 (H) 11/19/2022   CREATININE 1.10 (H) 11/19/2022    CALCIUM 9.1 11/19/2022      RADIOGRAPHY: No results found.    IMPRESSION: {diagnosis}  ***  Today, I talked to the patient and family about the findings and work-up thus far.  We discussed the natural history of *** and general treatment, highlighting the role of radiotherapy in the management.  We discussed the available radiation techniques, and focused on the details of logistics and delivery.  We reviewed the anticipated acute and late sequelae associated with radiation in this setting.  The patient was encouraged to ask questions that I answered to the best of my ability. *** A patient consent form was discussed and signed.  We retained a copy for our records.  The patient would like to proceed with radiation and will be scheduled for CT simulation.  PLAN: ***    *** minutes of total time was spent for this patient encounter, including preparation, face-to-face counseling with the patient and coordination of care, physical exam, and documentation of the encounter.   ------------------------------------------------  Billie Lade, PhD, MD  This document serves as a record of services personally performed by Antony Blackbird, MD. It was created on his behalf by Neena Rhymes, a trained medical scribe. The creation of this record is based on the scribe's personal observations and the provider's statements to them. This document has been checked and approved by the attending provider.

## 2022-12-06 ENCOUNTER — Ambulatory Visit
Admission: RE | Admit: 2022-12-06 | Discharge: 2022-12-06 | Disposition: A | Discharge status: Home / Self Care | Payer: Medicare HMO | Source: Ambulatory Visit | Attending: Radiation Oncology | Admitting: Radiation Oncology

## 2022-12-06 ENCOUNTER — Encounter: Payer: Self-pay | Admitting: Radiation Oncology

## 2022-12-06 VITALS — BP 124/54 | HR 78 | Temp 97.7°F | Resp 20 | Ht 61.0 in | Wt 175.6 lb

## 2022-12-06 DIAGNOSIS — Z7984 Long term (current) use of oral hypoglycemic drugs: Secondary | ICD-10-CM | POA: Insufficient documentation

## 2022-12-06 DIAGNOSIS — C541 Malignant neoplasm of endometrium: Secondary | ICD-10-CM | POA: Insufficient documentation

## 2022-12-06 DIAGNOSIS — Z801 Family history of malignant neoplasm of trachea, bronchus and lung: Secondary | ICD-10-CM | POA: Insufficient documentation

## 2022-12-06 DIAGNOSIS — M8580 Other specified disorders of bone density and structure, unspecified site: Secondary | ICD-10-CM | POA: Diagnosis not present

## 2022-12-06 DIAGNOSIS — Z7982 Long term (current) use of aspirin: Secondary | ICD-10-CM | POA: Diagnosis not present

## 2022-12-06 DIAGNOSIS — E876 Hypokalemia: Secondary | ICD-10-CM | POA: Diagnosis not present

## 2022-12-06 DIAGNOSIS — I10 Essential (primary) hypertension: Secondary | ICD-10-CM | POA: Diagnosis not present

## 2022-12-06 DIAGNOSIS — D649 Anemia, unspecified: Secondary | ICD-10-CM | POA: Insufficient documentation

## 2022-12-06 DIAGNOSIS — Z791 Long term (current) use of non-steroidal anti-inflammatories (NSAID): Secondary | ICD-10-CM | POA: Insufficient documentation

## 2022-12-06 DIAGNOSIS — E785 Hyperlipidemia, unspecified: Secondary | ICD-10-CM | POA: Diagnosis not present

## 2022-12-06 DIAGNOSIS — Z90710 Acquired absence of both cervix and uterus: Secondary | ICD-10-CM | POA: Insufficient documentation

## 2022-12-06 DIAGNOSIS — E119 Type 2 diabetes mellitus without complications: Secondary | ICD-10-CM | POA: Insufficient documentation

## 2022-12-06 DIAGNOSIS — G629 Polyneuropathy, unspecified: Secondary | ICD-10-CM | POA: Diagnosis not present

## 2022-12-06 DIAGNOSIS — K219 Gastro-esophageal reflux disease without esophagitis: Secondary | ICD-10-CM | POA: Insufficient documentation

## 2022-12-06 DIAGNOSIS — Z803 Family history of malignant neoplasm of breast: Secondary | ICD-10-CM | POA: Diagnosis not present

## 2022-12-06 DIAGNOSIS — Z90722 Acquired absence of ovaries, bilateral: Secondary | ICD-10-CM | POA: Diagnosis not present

## 2022-12-06 DIAGNOSIS — M1990 Unspecified osteoarthritis, unspecified site: Secondary | ICD-10-CM | POA: Insufficient documentation

## 2022-12-06 NOTE — Addendum Note (Signed)
Encounter addended by: Benard Halsted, LPN on: 01/0/2725 12:19 PM  Actions taken: Charge Capture section accepted

## 2022-12-07 ENCOUNTER — Encounter: Payer: Self-pay | Admitting: Hematology and Oncology

## 2022-12-09 NOTE — H&P (Signed)
Chief Complaint: Patient was seen in consultation today for port-a-catheter placement.   Referring Physician(s): Artis Delay  Supervising Physician: Malachy Moan  Patient Status: [facility] - Out-pt  History of Present Illness: Alice Hickman is a 72 y.o. female with a medical history significant for anxiety/depression, DM2, HTN and recently diagnosed endometrial cancer. She presented to her OB/GYN in August 2024 with complaints of postmenopausal bleeding. A pelvic ultrasound showed findings concerning for malignancy and a biopsy was positive for uterine serous carcinoma. She is now s/p hysterectomy, BSO and nodal biopsies 11/18/22. Her oncology team is preparing her for adjuvant chemotherapy as well as vaginal brachytherapy.   Interventional Radiology has been asked to evaluate this patient for an image-guided port-a-catheter placement to facilitate her treatment plans.   Past Medical History:  Diagnosis Date   ALLERGIC RHINITIS 05/01/2007   Allergy    Anemia    ANKLE PAIN, RIGHT 09/02/2009   ANXIETY, SITUATIONAL 09/02/2009   Cancer (HCC)    CARPAL TUNNEL SYNDROME, RIGHT 11/15/2006   Depression    DM w/o Complication Type II 11/15/2006   Generalized osteoarthritis 10/27/2012   bilat ankles, knees, hands and wrists   GERD 11/15/2006   GLAUCOMA 07/04/2008   Gout 11/14/2014   HYPERLIPIDEMIA 05/01/2007   HYPERTENSION 11/15/2006   HYPOKALEMIA 10/02/2008   Low back pain    OSTEOPENIA 09/02/2009   Peripheral neuropathy 11/14/2014   THUMB PAIN, RIGHT 04/02/2009    Past Surgical History:  Procedure Laterality Date   COLONOSCOPY     CYSTOSCOPY  11/18/2022   Procedure: CYSTOSCOPY;  Surgeon: Carver Fila, MD;  Location: [facility];  Service: Gynecology;;   NO PAST SURGERIES     ROBOTIC ASSISTED TOTAL HYSTERECTOMY WITH BILATERAL SALPINGO OOPHERECTOMY N/A 11/18/2022   Procedure: XI ROBOTIC ASSISTED TOTAL HYSTERECTOMY WITH BILATERAL SALPINGO OOPHORECTOMY WITH OMENTECTOMY;   Surgeon: Carver Fila, MD;  Location: [facility];  Service: Gynecology;  Laterality: N/A;   SENTINEL NODE BIOPSY N/A 11/18/2022   Procedure: SENTINEL NODE BIOPSY;  Surgeon: Carver Fila, MD;  Location: [facility];  Service: Gynecology;  Laterality: N/A;    Allergies: Penicillins, Lovastatin, and Metformin  Medications: Prior to Admission medications   Medication Sig Start Date End Date Taking? Authorizing Provider  allopurinol (ZYLOPRIM) 100 MG tablet Take 100 mg by mouth daily.    [provider]  aspirin 81 MG chewable tablet Chew 81 mg by mouth daily. Patient not taking: Reported on 11/04/2022    [provider]  Colchicine 0.6 MG CAPS Take 0.6 mg by mouth 2 (two) times daily as needed (gout flare pain).    [provider]  dexamethasone (DECADRON) 4 MG tablet Take 2 tabs at the night before and 2 tab the morning of chemotherapy, every 3 weeks, by mouth x 6 cycles Patient not taking: Reported on 12/06/2022 11/30/22   Artis Delay, MD  ferrous sulfate 325 (65 FE) MG tablet Take 325 mg by mouth daily with breakfast. Patient not taking: Reported on 12/06/2022    [provider]  furosemide (LASIX) 20 MG tablet Take 1 tablet (20 mg total) by mouth as needed. Patient not taking: Reported on 12/06/2022 08/12/21   Elder Negus, MD  glimepiride (AMARYL) 2 MG tablet TAKE ONE TABLET BY MOUTH ONCE DAILY BEFORE BREAKFAST 11/06/15   Corwin Levins, MD  latanoprost (XALATAN) 0.005 % ophthalmic solution Place 1 drop into both eyes at bedtime.    [provider]  lidocaine-prilocaine (EMLA) cream Apply to affected area  once Patient not taking: Reported on 12/06/2022 12/16/22   Artis Delay, MD  meloxicam (MOBIC) 7.5 MG tablet Take 1 tablet (7.5 mg total) by mouth daily. Patient taking differently: Take 7.5 mg by mouth daily as needed for pain. 07/22/22   Claudie Leach, DO  olmesartan-hydrochlorothiazide (BENICAR HCT) 40-25 MG tablet TAKE ONE TABLET  BY MOUTH ONCE DAILY. Patient taking differently: Take 1 tablet by mouth daily. 11/06/15   Corwin Levins, MD  ondansetron (ZOFRAN) 8 MG tablet Take 1 tablet (8 mg total) by mouth every 8 (eight) hours as needed for nausea or vomiting. Start on the third day after chemotherapy. Patient not taking: Reported on 12/06/2022 12/16/22   Artis Delay, MD  prochlorperazine (COMPAZINE) 10 MG tablet Take 1 tablet (10 mg total) by mouth every 6 (six) hours as needed for nausea or vomiting. Patient not taking: Reported on 12/06/2022 12/16/22   Artis Delay, MD  rosuvastatin (CRESTOR) 20 MG tablet Take 1 tablet (20 mg total) by mouth daily. Patient not taking: Reported on 12/06/2022 11/05/22 02/03/23  Patwardhan, Anabel Bene, MD  senna-docusate (SENOKOT-S) 8.6-50 MG tablet Take 2 tablets by mouth at bedtime. For AFTER surgery, do not take if having diarrhea 10/22/22   Carver Fila, MD  traMADol (ULTRAM) 50 MG tablet Take 1 tablet (50 mg total) by mouth every 6 (six) hours as needed for severe pain. For AFTER surgery only, do not take and drive 09/30/54   Carver Fila, MD     Family History  Problem Relation Age of Onset   Heart disease Mother    Hypertension Mother    Cancer Brother        throat cancer, smoker   Cancer Brother    Breast cancer Granddaughter    Cancer Niece    Breast cancer Niece    Heart disease Other    Hypertension Other    Colon cancer Neg Hx    Colon polyps Neg Hx    Esophageal cancer Neg Hx    Stomach cancer Neg Hx    Rectal cancer Neg Hx     Social History   Socioeconomic History   Marital status: Single    Spouse name: Not on file   Number of children: 3   Years of education: Not on file   Highest education level: Not on file  Occupational History   Occupation: FLOATER    Employer: [employer]   Occupation: Education officer, environmental, as well as Statistician   Occupation: retired  Tobacco Use   Smoking status: Never   Smokeless tobacco: Never  Vaping Use   Vaping  status: Never Used  Substance and Sexual Activity   Alcohol use: No   Drug use: No   Sexual activity: Not on file  Other Topics Concern   Not on file  Social History Narrative   Not on file   Social Determinants of Health   Financial Resource Strain: Not on file  Food Insecurity: No Food Insecurity (12/06/2022)   Hunger Vital Sign    Worried About Running Out of Food in the Last Year: Never true    Ran Out of Food in the Last Year: Never true  Transportation Needs: No Transportation Needs (12/06/2022)   PRAPARE - Administrator, Civil Service (Medical): No    Lack of Transportation (Non-Medical): No  Physical Activity: Not on file  Stress: Not on file  Social Connections: Not on file    Review of Systems: A 12  point ROS discussed and pertinent positives are indicated in the HPI above.  All other systems are negative.  Review of Systems  Constitutional:  Negative for appetite change and fatigue.  Respiratory:  Negative for cough and shortness of breath.   Cardiovascular:  Negative for chest pain and leg swelling.  Gastrointestinal:  Positive for constipation. Negative for abdominal pain, nausea and vomiting.  Neurological:  Negative for dizziness and headaches.    Vital Signs: There were no vitals taken for this visit.  Physical Exam Constitutional:      General: She is not in acute distress.    Appearance: She is not ill-appearing.  HENT:     Mouth/Throat:     Mouth: Mucous membranes are moist.     Pharynx: Oropharynx is clear.  Pulmonary:     Effort: Pulmonary effort is normal.  Abdominal:     Palpations: Abdomen is soft.     Tenderness: There is no abdominal tenderness.     Comments: Multiple laparoscopic incision sites. These are all well-approximated and without erythema, tenderness or drainage.   Skin:    General: Skin is warm and dry.  Neurological:     Mental Status: She is alert and oriented to person, place, and time.     Imaging: No  results found.  Labs:  CBC: Recent Labs    10/29/22 1122 11/19/22 0434  WBC 4.4 8.6  HGB 11.5* 11.2*  HCT 37.4 35.7*  PLT 246 213    COAGS: No results for input(s): "INR", "APTT" in the last 8760 hours.  BMP: Recent Labs    10/28/22 1553 10/29/22 1122 11/19/22 0434  NA  --  140 133*  K  --  4.1 4.2  CL  --  102 99  CO2  --  27 24  GLUCOSE  --  131* 179*  BUN  --  20 25*  CALCIUM  --  9.5 9.1  CREATININE 1.30* 1.07* 1.10*  GFRNONAA  --  56* 54*    LIVER FUNCTION TESTS: Recent Labs    10/29/22 1122  BILITOT 0.8  AST 16  ALT 14  ALKPHOS 43  PROT 7.2  ALBUMIN 3.6    TUMOR MARKERS: No results for input(s): "AFPTM", "CEA", "CA199", "CHROMGRNA" in the last 8760 hours.  Assessment and Plan:  Endometrial cancer; pending chemotherapy: Tanyetta Quimby. Litterio, 73 year old female, presents today to the [facility] Interventional Radiology department for an image-guided port-a-catheter placement.   Risks and benefits of image-guided Port-a-catheter placement were discussed with the patient including, but not limited to bleeding, infection, pneumothorax, or fibrin sheath development and need for additional procedures.  All of the patient's questions were answered, patient is agreeable to proceed. She has been NPO. She is a full code.   Consent signed and in chart.  Thank you for this interesting consult.  I greatly enjoyed meeting MEGEAN YONEMURA and look forward to participating in their care.  A copy of this report was sent to the requesting provider on this date.  Electronically Signed: Alwyn Ren, AGACNP-BC [phone] 12/10/2022, 1:13 PM   I spent a total of  30 Minutes   in face to face in clinical consultation, greater than 50% of which was counseling/coordinating care for port-a-catheter placement.

## 2022-12-09 NOTE — Progress Notes (Signed)
Pharmacist Chemotherapy Monitoring - Initial Assessment    Anticipated start date: 12/17/22   The following has been reviewed per standard work regarding the patient's treatment regimen: The patient's diagnosis, treatment plan and drug doses, and organ/hematologic function Lab orders and baseline tests specific to treatment regimen  The treatment plan start date, drug sequencing, and pre-medications Prior authorization status  Patient's documented medication list, including drug-drug interaction screen and prescriptions for anti-emetics and supportive care specific to the treatment regimen The drug concentrations, fluid compatibility, administration routes, and timing of the medications to be used The patient's access for treatment and lifetime cumulative dose history, if applicable  The patient's medication allergies and previous infusion related reactions, if applicable   Changes made to treatment plan:  N/A  Follow up needed:  Pending authorization for treatment    Drusilla Kanner, PharmD, MBA

## 2022-12-10 ENCOUNTER — Other Ambulatory Visit (HOSPITAL_COMMUNITY): Payer: Self-pay | Admitting: Student

## 2022-12-10 ENCOUNTER — Encounter (HOSPITAL_COMMUNITY): Payer: Self-pay

## 2022-12-10 ENCOUNTER — Telehealth: Payer: Self-pay

## 2022-12-10 ENCOUNTER — Ambulatory Visit (HOSPITAL_COMMUNITY)
Admission: RE | Admit: 2022-12-10 | Discharge: 2022-12-10 | Disposition: A | Discharge status: Home / Self Care | Payer: Medicare HMO | Source: Ambulatory Visit | Attending: Hematology and Oncology | Admitting: Hematology and Oncology

## 2022-12-10 ENCOUNTER — Telehealth: Payer: Self-pay | Admitting: *Deleted

## 2022-12-10 ENCOUNTER — Ambulatory Visit (HOSPITAL_COMMUNITY)
Admission: RE | Admit: 2022-12-10 | Discharge: 2022-12-10 | Disposition: A | Discharge status: Home / Self Care | Payer: Medicare HMO | Source: Ambulatory Visit | Attending: Hematology and Oncology

## 2022-12-10 DIAGNOSIS — I10 Essential (primary) hypertension: Secondary | ICD-10-CM | POA: Diagnosis not present

## 2022-12-10 DIAGNOSIS — F32A Depression, unspecified: Secondary | ICD-10-CM | POA: Insufficient documentation

## 2022-12-10 DIAGNOSIS — C541 Malignant neoplasm of endometrium: Secondary | ICD-10-CM

## 2022-12-10 DIAGNOSIS — F419 Anxiety disorder, unspecified: Secondary | ICD-10-CM | POA: Diagnosis not present

## 2022-12-10 DIAGNOSIS — Z7984 Long term (current) use of oral hypoglycemic drugs: Secondary | ICD-10-CM | POA: Insufficient documentation

## 2022-12-10 DIAGNOSIS — E119 Type 2 diabetes mellitus without complications: Secondary | ICD-10-CM | POA: Diagnosis not present

## 2022-12-10 HISTORY — PX: IR IMAGING GUIDED PORT INSERTION: IMG5740

## 2022-12-10 MED ORDER — LIDOCAINE-EPINEPHRINE 1 %-1:100000 IJ SOLN
INTRAMUSCULAR | Status: AC
  Filled 2022-12-10: qty 1

## 2022-12-10 MED ORDER — FENTANYL CITRATE (PF) 100 MCG/2ML IJ SOLN
INTRAMUSCULAR | Status: AC
  Filled 2022-12-10: qty 2

## 2022-12-10 MED ORDER — HEPARIN SOD (PORK) LOCK FLUSH 100 UNIT/ML IV SOLN
500.0000 [IU] | Freq: Once | INTRAVENOUS | Status: AC
  Administered 2022-12-10: 500 [IU] via INTRAVENOUS

## 2022-12-10 MED ORDER — SODIUM CHLORIDE 0.9 % IV SOLN: INTRAVENOUS | Status: DC

## 2022-12-10 MED ORDER — LIDOCAINE-EPINEPHRINE 1 %-1:100000 IJ SOLN
20.0000 mL | Freq: Once | INTRAMUSCULAR | Status: AC
  Administered 2022-12-10: 20 mL via INTRADERMAL

## 2022-12-10 MED ORDER — FENTANYL CITRATE (PF) 100 MCG/2ML IJ SOLN
INTRAMUSCULAR | Status: AC | PRN
  Administered 2022-12-10 (×2): 50 ug via INTRAVENOUS

## 2022-12-10 MED ORDER — MIDAZOLAM HCL 2 MG/2ML IJ SOLN
INTRAMUSCULAR | Status: AC
  Filled 2022-12-10: qty 2

## 2022-12-10 MED ORDER — MIDAZOLAM HCL 2 MG/2ML IJ SOLN
INTRAMUSCULAR | Status: AC | PRN
  Administered 2022-12-10 (×3): 1 mg via INTRAVENOUS

## 2022-12-10 MED ORDER — HEPARIN SOD (PORK) LOCK FLUSH 100 UNIT/ML IV SOLN
INTRAVENOUS | Status: AC
  Filled 2022-12-10: qty 5

## 2022-12-10 NOTE — Procedures (Signed)
Interventional Radiology Procedure Note  Procedure: Placement of a Right internal jugular single lumen power injectable Ang[employer] SmartPort. Tip is positioned at the superior cavoatrial junction and catheter is ready for immediate use.   Complications: No immediate  Recommendations:  - Ok to shower tomorrow - Do not submerge for 7 days - Routine line care    Signed,  Sterling Big, MD

## 2022-12-10 NOTE — Telephone Encounter (Signed)
Spoke with [employer] patient's daughter who called the office stating her Mother Raima has her procedure at 1230 for port placement today at 1230 and she is constipated and Dr. Bertis Ruddy advised her to only take 1 senekot-s and use miralax but the miralax hasn't been working.   Summer was transferred to Dr. Maxine Glenn nurse and advised to leave a message if they don't pick up the phone right away and they will call back.   Pt's daughter also was asking when her mother can have tub baths. Advised that her mother should wait to soak and bath in the tub for at least 10 weeks and follow the post surgery instructions in regards to nothing per vagina for 10 weeks.  Summer verbalized understanding and had no further concerns or questions at this time.

## 2022-12-10 NOTE — Discharge Instructions (Signed)
Implanted Port Insertion, Care After  The following information offers guidance on how to care for yourself after your procedure. Your health care provider may also give you more specific instructions. If you have problems or questions, contact your health care provider.  What can I expect after the procedure? After the procedure, it is common to have: Discomfort at the port insertion site. Bruising on the skin over the port. This should improve over 3-4 days.   Urgent needs - Interventional Radiology, clinic [phone] (mon-fri 8-5).   Wound - May remove dressing and shower in 24 to 48 hours.  Keep site clean and dry.  Replace with bandaid as needed.  Do not submerge in tub or water until site healing well. If closed with glue, glue will flake off on its own.   If ordered by your provider, may start Emla cream (or any other creams ointments or lotions) in 2 weeks or after incision is healed. Port is ready for use immediately.   After completion of treatment, your provider should have you set up for monthly port flushes.   Follow these instructions at home: Port care After your port is placed, you will get a manufacturer's information card. The card has information about your port. Keep this card with you at all times. Take care of the port as told by your health care provider. Ask your health care provider if you or a family member can get training for taking care of the port at home. A home health care nurse will be be available to help care for the port. Make sure to remember what type of port you have. Incision care     Follow instructions from your health care provider about how to take care of your port insertion site. Make sure you: Wash your hands with soap and water for at least 20 seconds before and after you change your bandage (dressing). If soap and water are not available, use hand sanitizer. Change your dressing as told by your health care provider. Leave stitches  (sutures), skin glue, or adhesive strips in place. These skin closures may need to stay in place for 2 weeks or longer. If adhesive strip edges start to loosen and curl up, you may trim the loose edges. Do not remove adhesive strips completely unless your health care provider tells you to do that. Check your port insertion site every day for signs of infection. Check for: Redness, swelling, or pain. Fluid or blood. Warmth. Pus or a bad smell. Activity Return to your normal activities as told by your health care provider. Ask your health care provider what activities are safe for you. You may have to avoid lifting. Ask your health care provider how much you can safely lift. General instructions Take over-the-counter and prescription medicines only as told by your health care provider. Do not take baths, swim, or use a hot tub until your health care provider approves. Ask your health care provider if you may take showers. You may only be allowed to take sponge baths. If you were given a sedative during the procedure, it can affect you for several hours. Do not drive or operate machinery until your health care provider says that it is safe. Wear a medical alert bracelet in case of an emergency. This will tell any health care providers that you have a port. Keep all follow-up visits. This is important. Contact a health care provider if: You cannot flush your port with saline as directed, or you cannot   draw blood from the port. You have a fever or chills. You have redness, swelling, or pain around your port insertion site. You have fluid or blood coming from your port insertion site. Your port insertion site feels warm to the touch. You have pus or a bad smell coming from the port insertion site. Get help right away if: You have chest pain or shortness of breath. You have bleeding from your port that you cannot control. These symptoms may be an emergency. Get help right away. Call 911. Do not  wait to see if the symptoms will go away. Do not drive yourself to the hospital. Summary Take care of the port as told by your health care provider. Keep the manufacturer's information card with you at all times. Change your dressing as told by your health care provider. Contact a health care provider if you have a fever or chills or if you have redness, swelling, or pain around your port insertion site. Keep all follow-up visits. This information is not intended to replace advice given to you by your health care provider. Make sure you discuss any questions you have with your health care provider. Document Revised: 07/22/2020 Document Reviewed: 07/22/2020 Elsevier Patient Education  2023 Elsevier Inc.    Moderate Conscious Sedation  Adult  Care After (English)  After the procedure, it is common to have: Sleepiness for a few hours. Impaired judgment for a few hours. Trouble with balance. Nausea or vomiting if you eat too soon. Follow these instructions at home: For the time period you were told by your health care provider:  Rest. Do not participate in activities where you could fall or become injured. Do not drive or use machinery. Do not drink alcohol. Do not take sleeping pills or medicines that cause drowsiness. Do not make important decisions or sign legal documents. Do not take care of children on your own. Eating and drinking Follow instructions from your health care provider about what you may eat and drink. Drink enough fluid to keep your urine pale yellow. If you vomit: Drink clear fluids slowly and in small amounts as you are able. Clear fluids include water, ice chips, low-calorie sports drinks, and fruit juice that has water added to it (diluted fruit juice). Eat light and bland foods in small amounts as you are able. These foods include bananas, applesauce, rice, lean meats, toast, and crackers. General instructions Take over-the-counter and prescription medicines  only as told by your health care provider. Have a responsible adult stay with you for the time you are told. Do not use any products that contain nicotine or tobacco. These products include cigarettes, chewing tobacco, and vaping devices, such as e-cigarettes. If you need help quitting, ask your health care provider. Return to your normal activities as told by your health care provider. Ask your health care provider what activities are safe for you. Your health care provider may give you more instructions. Make sure you know what you can and cannot do. Contact a health care provider if: You are still sleepy or having trouble with balance after 24 hours. You feel light-headed. You vomit every time you eat or drink. You get a rash. You have a fever. You have redness or swelling around the IV site. Get help right away if: You have trouble breathing. You start to feel confused at home. These symptoms may be an emergency. Get help right away. Call 911. Do not wait to see if the symptoms will go away. Do not   drive yourself to the hospital. This information is not intended to replace advice given to you by your health care provider. Make sure you discuss any questions you have with your health care provider. 

## 2022-12-10 NOTE — Telephone Encounter (Signed)
Patient's daughter, Summer, called to report that the patient has been more constipated than normal over the past 2 days. Patient is to have PAC placement today, but was unsure whether to go d/t the constipation. Dr. Bertis Ruddy aware of the situation and encouraged patient to keep the appointment, as it takes a few weeks to reschedule the patient. Patient can increase senokot dose from 1 to 2 capsules, as they had previously done.  Patient is okay to take 2 senokots with small amount of water prior to procedure. Patient may continue bowel regimen following procedure. Should patient not find relief, she can call back to the office for further instructions. Patient's daughter verbalized an understanding of the information.

## 2022-12-13 ENCOUNTER — Other Ambulatory Visit: Payer: Self-pay | Admitting: Oncology

## 2022-12-13 LAB — GLUCOSE, CAPILLARY: Glucose-Capillary: 75 mg/dL (ref 70–99)

## 2022-12-13 NOTE — Progress Notes (Signed)
Gynecologic Oncology Multi-Disciplinary Disposition Conference Note  Date of the Conference: 12/13/2022  Patient Name: Alice Hickman  Referring Provider: Dr. Langston Masker Primary GYN Oncologist: Dr. Pricilla Holm   Stage/Disposition:  Stage IIIC uterine serous carcinoma. Disposition is to adjuvant treatment with chemotherapy and vaginal brachytherapy. Her2 testing requested.  Also consideration for genetic counseling referral.   This Multidisciplinary conference took place involving physicians from Gynecologic Oncology, Medical Oncology, Radiation Oncology, Pathology, Radiology along with the Gynecologic Oncology Nurse Practitioner and Gynecologic Oncology Nurse Navigator.  Comprehensive assessment of the patient's malignancy, staging, need for surgery, chemotherapy, radiation therapy, and need for further testing were reviewed. Supportive measures, both inpatient and following discharge were also discussed. The recommended plan of care is documented. Greater than 35 minutes were spent correlating and coordinating this patient's care.

## 2022-12-14 ENCOUNTER — Inpatient Hospital Stay: Payer: Medicare HMO | Attending: Gynecologic Oncology

## 2022-12-14 ENCOUNTER — Inpatient Hospital Stay: Payer: Medicare HMO

## 2022-12-14 DIAGNOSIS — Z79899 Other long term (current) drug therapy: Secondary | ICD-10-CM | POA: Insufficient documentation

## 2022-12-14 DIAGNOSIS — Z5111 Encounter for antineoplastic chemotherapy: Secondary | ICD-10-CM | POA: Diagnosis present

## 2022-12-14 DIAGNOSIS — C541 Malignant neoplasm of endometrium: Secondary | ICD-10-CM | POA: Diagnosis present

## 2022-12-14 LAB — CBC WITH DIFFERENTIAL (CANCER CENTER ONLY)
Abs Immature Granulocytes: 0.01 10*3/uL (ref 0.00–0.07)
Basophils Absolute: 0 10*3/uL (ref 0.0–0.1)
Basophils Relative: 1 %
Eosinophils Absolute: 0.4 10*3/uL (ref 0.0–0.5)
Eosinophils Relative: 8 %
HCT: 37.1 % (ref 36.0–46.0)
Hemoglobin: 11.8 g/dL — ABNORMAL LOW (ref 12.0–15.0)
Immature Granulocytes: 0 %
Lymphocytes Relative: 28 %
Lymphs Abs: 1.5 10*3/uL (ref 0.7–4.0)
MCH: 28 pg (ref 26.0–34.0)
MCHC: 31.8 g/dL (ref 30.0–36.0)
MCV: 88.1 fL (ref 80.0–100.0)
Monocytes Absolute: 0.5 10*3/uL (ref 0.1–1.0)
Monocytes Relative: 10 %
Neutro Abs: 2.8 10*3/uL (ref 1.7–7.7)
Neutrophils Relative %: 53 %
Platelet Count: 230 10*3/uL (ref 150–400)
RBC: 4.21 MIL/uL (ref 3.87–5.11)
RDW: 13.7 % (ref 11.5–15.5)
WBC Count: 5.3 10*3/uL (ref 4.0–10.5)
nRBC: 0 % (ref 0.0–0.2)

## 2022-12-14 LAB — CMP (CANCER CENTER ONLY)
ALT: 11 U/L (ref 0–44)
AST: 14 U/L — ABNORMAL LOW (ref 15–41)
Albumin: 3.9 g/dL (ref 3.5–5.0)
Alkaline Phosphatase: 54 U/L (ref 38–126)
Anion gap: 6 (ref 5–15)
BUN: 22 mg/dL (ref 8–23)
CO2: 30 mmol/L (ref 22–32)
Calcium: 9.9 mg/dL (ref 8.9–10.3)
Chloride: 107 mmol/L (ref 98–111)
Creatinine: 1.15 mg/dL — ABNORMAL HIGH (ref 0.44–1.00)
GFR, Estimated: 51 mL/min — ABNORMAL LOW (ref >60)
Glucose, Bld: 105 mg/dL — ABNORMAL HIGH (ref 70–99)
Potassium: 3.4 mmol/L — ABNORMAL LOW (ref 3.5–5.1)
Sodium: 143 mmol/L (ref 135–145)
Total Bilirubin: 0.4 mg/dL (ref <1.2)
Total Protein: 7.1 g/dL (ref 6.5–8.1)

## 2022-12-15 NOTE — Progress Notes (Unsigned)
Gynecologic Oncology Post-operative Follow Up  Reason for Visit: follow-up post-op  Treatment History: Oncology History Overview Note  High grade serous, MMR normal   Endometrial cancer (HCC)  10/06/2022 Initial Biopsy   Endometrial biopsy was performed on 9/4 and revealed uterine serous carcinoma.    10/28/2022 Imaging   CT C/A/P: 1. Abnormally thickened endometrium (27 mm thickness), compatible with known primary endometrial malignancy. 2. No evidence of metastatic disease in the chest, abdomen or pelvis. 3. Moderate diffuse colonic diverticulosis. 4. One vessel coronary atherosclerosis. 5.  Aortic Atherosclerosis (ICD10-I70.0).   11/18/2022 Initial Diagnosis   Endometrial cancer (HCC)   11/18/2022 Surgery   Robotic-assisted laparoscopic total hysterectomy with bilateral salpingoophorectomy, SLN biopsy bilaterally, omentectomy, cystoscopy, vaginal repair   Findings: On EUA, enlarged mobile uterus. On intra-abdominal entry, normal upper abodminal survey. Normal omentum, small and large bowel. Uterus 12 cm with 4-5 cm calcified anterior fundal fibroid, mildly bulbous. Normal apeparing bilateral adnexa. Mapping successful to bilateral obturator SLNs, no obvious adenopathy. Right sulcal and right sidewall vaginal lacerations due to vaginal delivery of specimen, repaired. On cystoscopy, intact dome, good efflux from bilateral ureteral orifices.    11/25/2022 Cancer Staging   Staging form: Corpus Uteri - Carcinoma and Carcinosarcoma, AJCC 8th Edition - Pathologic stage from 11/25/2022: FIGO Stage II, calculated as Stage IB (pT1b, pN0, cM0) - Signed by Artis Delay, MD on 11/25/2022 Stage prefix: Initial diagnosis   12/10/2022 Procedure   Successful placement of a right IJ approach Power Port with ultrasound and fluoroscopic guidance. The catheter is ready for use.   12/17/2022 -  Chemotherapy   Patient is on Treatment Plan : UTERINE Carboplatin AUC 6 + Paclitaxel q21d       Interval  History: Doing well.  Soreness improving with each day.  Reports normal bowel function.  Denies any urinary symptoms or vaginal bleeding.  Past Medical/Surgical History: Past Medical History:  Diagnosis Date   ALLERGIC RHINITIS 05/01/2007   Allergy    Anemia    ANKLE PAIN, RIGHT 09/02/2009   ANXIETY, SITUATIONAL 09/02/2009   Cancer (HCC)    CARPAL TUNNEL SYNDROME, RIGHT 11/15/2006   Depression    DM w/o Complication Type II 11/15/2006   Generalized osteoarthritis 10/27/2012   bilat ankles, knees, hands and wrists   GERD 11/15/2006   GLAUCOMA 07/04/2008   Gout 11/14/2014   HYPERLIPIDEMIA 05/01/2007   HYPERTENSION 11/15/2006   HYPOKALEMIA 10/02/2008   Low back pain    OSTEOPENIA 09/02/2009   Peripheral neuropathy 11/14/2014   THUMB PAIN, RIGHT 04/02/2009    Past Surgical History:  Procedure Laterality Date   COLONOSCOPY     CYSTOSCOPY  11/18/2022   Procedure: CYSTOSCOPY;  Surgeon: Carver Fila, MD;  Location: [facility];  Service: Gynecology;;   IR IMAGING GUIDED PORT INSERTION  12/10/2022   NO PAST SURGERIES     ROBOTIC ASSISTED TOTAL HYSTERECTOMY WITH BILATERAL SALPINGO OOPHERECTOMY N/A 11/18/2022   Procedure: XI ROBOTIC ASSISTED TOTAL HYSTERECTOMY WITH BILATERAL SALPINGO OOPHORECTOMY WITH OMENTECTOMY;  Surgeon: Carver Fila, MD;  Location: [facility];  Service: Gynecology;  Laterality: N/A;   SENTINEL NODE BIOPSY N/A 11/18/2022   Procedure: SENTINEL NODE BIOPSY;  Surgeon: Carver Fila, MD;  Location: [facility];  Service: Gynecology;  Laterality: N/A;    Family History  Problem Relation Age of Onset   Heart disease Mother    Hypertension Mother    Cancer Brother        throat cancer, smoker   Cancer Brother  Breast cancer Granddaughter    Cancer Niece    Breast cancer Niece    Heart disease Other    Hypertension Other    Colon cancer Neg Hx    Colon polyps Neg Hx    Esophageal cancer Neg Hx    Stomach cancer Neg Hx    Rectal cancer Neg Hx      Social History   Socioeconomic History   Marital status: Single    Spouse name: Not on file   Number of children: 3   Years of education: Not on file   Highest education level: Not on file  Occupational History   Occupation: FLOATER    Employer: [employer]   Occupation: Education officer, environmental, as well as Statistician   Occupation: retired  Tobacco Use   Smoking status: Never   Smokeless tobacco: Never  Vaping Use   Vaping status: Never Used  Substance and Sexual Activity   Alcohol use: No   Drug use: No   Sexual activity: Not on file  Other Topics Concern   Not on file  Social History Narrative   Not on file   Social Determinants of Health   Financial Resource Strain: Not on file  Food Insecurity: No Food Insecurity (12/06/2022)   Hunger Vital Sign    Worried About Running Out of Food in the Last Year: Never true    Ran Out of Food in the Last Year: Never true  Transportation Needs: No Transportation Needs (12/06/2022)   PRAPARE - Administrator, Civil Service (Medical): No    Lack of Transportation (Non-Medical): No  Physical Activity: Not on file  Stress: Not on file  Social Connections: Not on file    Current Medications:  Current Outpatient Medications:    allopurinol (ZYLOPRIM) 100 MG tablet, Take 100 mg by mouth daily., Disp: , Rfl:    aspirin 81 MG chewable tablet, Chew 81 mg by mouth daily. (Patient not taking: Reported on 11/04/2022), Disp: , Rfl:    Colchicine 0.6 MG CAPS, Take 0.6 mg by mouth 2 (two) times daily as needed (gout flare pain)., Disp: , Rfl:    dexamethasone (DECADRON) 4 MG tablet, Take 2 tabs at the night before and 2 tab the morning of chemotherapy, every 3 weeks, by mouth x 6 cycles (Patient not taking: Reported on 12/06/2022), Disp: 24 tablet, Rfl: 6   ferrous sulfate 325 (65 FE) MG tablet, Take 325 mg by mouth daily with breakfast. (Patient not taking: Reported on 12/06/2022), Disp: , Rfl:    furosemide (LASIX) 20 MG tablet,  Take 1 tablet (20 mg total) by mouth as needed., Disp: 60 tablet, Rfl: 2   glimepiride (AMARYL) 2 MG tablet, TAKE ONE TABLET BY MOUTH ONCE DAILY BEFORE BREAKFAST, Disp: 90 tablet, Rfl: 0   latanoprost (XALATAN) 0.005 % ophthalmic solution, Place 1 drop into both eyes at bedtime., Disp: , Rfl:    [START ON 12/16/2022] lidocaine-prilocaine (EMLA) cream, Apply to affected area once (Patient not taking: Reported on 12/06/2022), Disp: 30 g, Rfl: 3   meloxicam (MOBIC) 7.5 MG tablet, Take 1 tablet (7.5 mg total) by mouth daily. (Patient taking differently: Take 7.5 mg by mouth daily as needed for pain.), Disp: 30 tablet, Rfl: 0   olmesartan-hydrochlorothiazide (BENICAR HCT) 40-25 MG tablet, TAKE ONE TABLET BY MOUTH ONCE DAILY. (Patient taking differently: Take 1 tablet by mouth daily.), Disp: 30 tablet, Rfl: 5   [START ON 12/16/2022] ondansetron (ZOFRAN) 8 MG tablet, Take 1 tablet (8  mg total) by mouth every 8 (eight) hours as needed for nausea or vomiting. Start on the third day after chemotherapy. (Patient not taking: Reported on 12/06/2022), Disp: 30 tablet, Rfl: 1   [START ON 12/16/2022] prochlorperazine (COMPAZINE) 10 MG tablet, Take 1 tablet (10 mg total) by mouth every 6 (six) hours as needed for nausea or vomiting. (Patient not taking: Reported on 12/06/2022), Disp: 30 tablet, Rfl: 1   rosuvastatin (CRESTOR) 20 MG tablet, Take 1 tablet (20 mg total) by mouth daily. (Patient not taking: Reported on 12/06/2022), Disp: 90 tablet, Rfl: 3   senna-docusate (SENOKOT-S) 8.6-50 MG tablet, Take 2 tablets by mouth at bedtime. For AFTER surgery, do not take if having diarrhea, Disp: 30 tablet, Rfl: 0   traMADol (ULTRAM) 50 MG tablet, Take 1 tablet (50 mg total) by mouth every 6 (six) hours as needed for severe pain. For AFTER surgery only, do not take and drive, Disp: 10 tablet, Rfl: 0  Review of Symptoms: Pertinent positives as per HPI.  Physical Exam: General: Well developed, well nourished female in no acute  distress. Alert and oriented x 3.  Neck: Supple without any enlargements.  Lymph node survey: No cervical, supraclavicular, or inguinal adenopathy.  Cardiovascular: Regular rate and rhythm. S1 and S2 normal.  Lungs: Clear to auscultation bilaterally. No wheezes/crackles/rhonchi noted.  Skin: No rashes or lesions present. Back: No CVA tenderness.  Abdomen: Abdomen soft, non-tender and obese. Active bowel sounds in all quadrants. No evidence of a fluid wave or abdominal masses.  Genitourinary:    Vulva/vagina: Normal external female genitalia. No lesions.    Urethra: No lesions or masses    Vagina: Atrophic without any lesions. No palpable masses. No vaginal bleeding or drainage noted.  Rectal: Good tone, no masses, no cul de sac nodularity.  Extremities: No bilateral cyanosis, edema, or clubbing.    Laboratory & Radiologic Studies: A. RIGHT OBTURATOR SENTINEL LYMPH NODE, BIOPSY: - Lymph node, negative for carcinoma (0/1)  B. LEFT OBTURATOR SENTINEL LYMPH NODE BIOPSY: - Lymph node, negative for carcinoma (0/1)  C. UTERUS, CERVIX, BILATERAL FALLOPIAN TUBES AND OVARIES, RESECTION: - High-grade serous carcinoma, presenting as 3 lesions measuring 1 cm, 2.9 cm and 4.6 cm - Carcinoma at the 4.6 cm lesion invades for a depth of 1.8 cm where myometrial thickness is 2.3 cm (78%) - Cervical stroma is not involved - Benign bilateral ovaries and fallopian tubes - No evidence of lymphovascular invasion - Benign leiomyomata - See oncology table  D. OMENTUM: - Portion of omentum, negative for carcinoma  ONCOLOGY TABLE:  UTERUS, CARCINOMA OR CARCINOSARCOMA: Resection  Procedure: Total hysterectomy and bilateral salpingo-oophorectomy Histologic Type: Serous carcinoma Histologic Grade: High-grade Myometrial Invasion:      Depth of Myometrial Invasion (mm): 18 mm      Myometrial Thickness (mm): 23 mm      Percentage of Myometrial Invasion: 78% Uterine Serosa Involvement: Not  identified Cervical stromal Involvement: Not identified Extent of involvement of other tissue/organs: Not identified Peritoneal/Ascitic Fluid: Negative for carcinoma Lymphovascular Invasion: Not identified Regional Lymph Nodes:      Pelvic Lymph Nodes Examined:                                  2 Sentinel  0 Non-sentinel                                  2 Total      Pelvic Lymph Nodes with Metastasis: 0                          Macrometastasis: (>2.0 mm): 0                          Micrometastasis: (>0.2 mm and < 2.0 mm): 0                          Isolated Tumor Cells (<0.2 mm): 0                          Laterality of Lymph Node with Tumor: Not applicable                          Extracapsular Extension: Not applicable      Para-aortic Lymph Nodes Examined:                                   0 Sentinel                                   0 non-sentinel                                   0 total Distant Metastasis:      Distant Site(s) Involved: Not applicable Pathologic Stage Classification (pTNM, AJCC 8th Edition): pT1b, pN0 Ancillary Studies: MMR testing will be ordered Representative Tumor Block: C5 Comment(s): Pancytokeratin was performed on the lymph nodes and is negative.   Assessment & Plan: Alice Hickman is a 72 y.o. woman with Stage IIC uterine serous carcinoma who presents for phone follow-up. MMRp.  Patient is overall doing well, meeting postoperative milestones.  Discussed continued expectations and restrictions.  Reviewed pathology with the patient's daughter who relayed this message to her mother.  Although confined to the uterus, given high risk histology, discussed recommendation for adjuvant treatment including chemotherapy and vaginal brachytherapy.  Recommended referral to both radiation oncology and medical oncology.  They are amenable.  Message sent to Clydie Braun, our nurse navigator, to facilitate appointments being scheduled.  I  discussed the assessment and treatment plan with the patient. The patient was provided with an opportunity to ask questions and all were answered. The patient agreed with the plan and demonstrated an understanding of the instructions.   The patient was advised to call back or see an in-person evaluation if the symptoms worsen or if the condition fails to improve as anticipated.   6 minutes of total time was spent for this patient encounter, including preparation, phone counseling with the patient and coordination of care, and documentation of the encounter.   Warner Mccreedy NP [facility] Oncology

## 2022-12-16 MED FILL — Fosaprepitant Dimeglumine For IV Infusion 150 MG (Base Eq): INTRAVENOUS | Qty: 5 | Status: AC

## 2022-12-17 ENCOUNTER — Inpatient Hospital Stay: Payer: Medicare HMO | Admitting: Gynecologic Oncology

## 2022-12-17 ENCOUNTER — Inpatient Hospital Stay: Payer: Medicare HMO

## 2022-12-17 ENCOUNTER — Encounter: Payer: Medicare HMO | Admitting: Gynecologic Oncology

## 2022-12-17 ENCOUNTER — Telehealth: Payer: Self-pay | Admitting: *Deleted

## 2022-12-17 VITALS — BP 123/97 | HR 57 | Temp 97.7°F | Resp 16 | Wt 179.6 lb

## 2022-12-17 DIAGNOSIS — C55 Malignant neoplasm of uterus, part unspecified: Secondary | ICD-10-CM

## 2022-12-17 DIAGNOSIS — C541 Malignant neoplasm of endometrium: Secondary | ICD-10-CM

## 2022-12-17 DIAGNOSIS — Z5111 Encounter for antineoplastic chemotherapy: Secondary | ICD-10-CM | POA: Diagnosis not present

## 2022-12-17 MED ORDER — SODIUM CHLORIDE 0.9% FLUSH
10.0000 mL | INTRAVENOUS | Status: DC | PRN
  Administered 2022-12-17: 10 mL

## 2022-12-17 MED ORDER — HEPARIN SOD (PORK) LOCK FLUSH 100 UNIT/ML IV SOLN
500.0000 [IU] | Freq: Once | INTRAVENOUS | Status: AC | PRN
  Administered 2022-12-17: 500 [IU]

## 2022-12-17 MED ORDER — CETIRIZINE HCL 10 MG/ML IV SOLN
10.0000 mg | Freq: Once | INTRAVENOUS | Status: AC
Start: 2022-12-17 — End: 2022-12-17
  Administered 2022-12-17: 10 mg via INTRAVENOUS
  Filled 2022-12-17: qty 1

## 2022-12-17 MED ORDER — SODIUM CHLORIDE 0.9 % IV SOLN
495.6000 mg | Freq: Once | INTRAVENOUS | Status: AC
  Administered 2022-12-17: 500 mg via INTRAVENOUS
  Filled 2022-12-17: qty 50

## 2022-12-17 MED ORDER — SODIUM CHLORIDE 0.9 % IV SOLN
INTRAVENOUS | Status: DC
Start: 2022-12-17 — End: 2022-12-17

## 2022-12-17 MED ORDER — DEXAMETHASONE SODIUM PHOSPHATE 10 MG/ML IJ SOLN
10.0000 mg | Freq: Once | INTRAMUSCULAR | Status: AC
  Administered 2022-12-17: 10 mg via INTRAVENOUS
  Filled 2022-12-17: qty 1

## 2022-12-17 MED ORDER — SODIUM CHLORIDE 0.9 % IV SOLN
140.0000 mg/m2 | Freq: Once | INTRAVENOUS | Status: AC
  Administered 2022-12-17: 258 mg via INTRAVENOUS
  Filled 2022-12-17: qty 43

## 2022-12-17 MED ORDER — SODIUM CHLORIDE 0.9 % IV SOLN
150.0000 mg | Freq: Once | INTRAVENOUS | Status: AC
  Administered 2022-12-17: 150 mg via INTRAVENOUS
  Filled 2022-12-17: qty 150

## 2022-12-17 MED ORDER — PALONOSETRON HCL INJECTION 0.25 MG/5ML
0.2500 mg | Freq: Once | INTRAVENOUS | Status: AC
  Administered 2022-12-17: 0.25 mg via INTRAVENOUS
  Filled 2022-12-17: qty 5

## 2022-12-17 MED ORDER — FAMOTIDINE IN NACL 20-0.9 MG/50ML-% IV SOLN
20.0000 mg | Freq: Once | INTRAVENOUS | Status: AC
  Administered 2022-12-17: 20 mg via INTRAVENOUS
  Filled 2022-12-17: qty 50

## 2022-12-17 NOTE — Telephone Encounter (Signed)
Moved patient's appt from 11/15 to 12/6 due patient having first time chemo on 11/15. Patient aware

## 2022-12-17 NOTE — Patient Instructions (Signed)
 [facility] - A DEPT OF [facility]  Discharge Instructions: Thank you for choosing [facility] to provide your oncology and hematology care.   If you have a lab appointment with the Cancer Center, please go directly to the Cancer Center and check in at the registration area.   Wear comfortable clothing and clothing appropriate for easy access to any Portacath or PICC line.   We strive to give you quality time with your provider. You may need to reschedule your appointment if you arrive late (15 or more minutes).  Arriving late affects you and other patients whose appointments are after yours.  Also, if you miss three or more appointments without notifying the office, you may be dismissed from the clinic at the provider's discretion.      For prescription refill requests, have your pharmacy contact our office and allow 72 hours for refills to be completed.    Today you received the following chemotherapy and/or immunotherapy agents Taxol, Carboplatin       To help prevent nausea and vomiting after your treatment, we encourage you to take your nausea medication as directed.  BELOW ARE SYMPTOMS THAT SHOULD BE REPORTED IMMEDIATELY: *FEVER GREATER THAN 100.4 F (38 C) OR HIGHER *CHILLS OR SWEATING *NAUSEA AND VOMITING THAT IS NOT CONTROLLED WITH YOUR NAUSEA MEDICATION *UNUSUAL SHORTNESS OF BREATH *UNUSUAL BRUISING OR BLEEDING *URINARY PROBLEMS (pain or burning when urinating, or frequent urination) *BOWEL PROBLEMS (unusual diarrhea, constipation, pain near the anus) TENDERNESS IN MOUTH AND THROAT WITH OR WITHOUT PRESENCE OF ULCERS (sore throat, sores in mouth, or a toothache) UNUSUAL RASH, SWELLING OR PAIN  UNUSUAL VAGINAL DISCHARGE OR ITCHING   Items with * indicate a potential emergency and should be followed up as soon as possible or go to the Emergency Department if any problems should occur.  Please show the CHEMOTHERAPY ALERT CARD or  IMMUNOTHERAPY ALERT CARD at check-in to the Emergency Department and triage nurse.  Should you have questions after your visit or need to cancel or reschedule your appointment, please contact [facility] - A DEPT OF [facility]  Dept: [phone]  and follow the prompts.  Office hours are 8:00 a.m. to 4:30 p.m. Monday - Friday. Please note that voicemails left after 4:00 p.m. may not be returned until the following business day.  We are closed weekends and major holidays. You have access to a nurse at all times for urgent questions. Please call the main number to the clinic Dept: [phone] and follow the prompts.   For any non-urgent questions, you may also contact your provider using MyChart. We now offer e-Visits for anyone 51 and older to request care online for non-urgent symptoms. For details visit [email or web address].   Also download the MyChart app! Go to the app store, search "MyChart", open the app, select [place], and log in with your MyChart username and password.

## 2022-12-20 ENCOUNTER — Telehealth: Payer: Self-pay | Admitting: *Deleted

## 2022-12-20 NOTE — Telephone Encounter (Signed)
Called & spoke with daughter to see how pt was doing post treatment.  She reports some nausea & medications helped.  Pt is sleeping OK.  She reports that her knee popped while in bed & is hurting some.  Tylenol has been given which helped some.  She denies any swelling/redness.  They have applied bengay. She is still having problems with constipation.  She is taking Miralax & stool softeners. She knows next appts & how to reach [place] if needed & encouraged to call if knee is worse or constipation not resolved.  Message routed to Dr Orpah Greek RN

## 2022-12-20 NOTE — Telephone Encounter (Signed)
-----   Message from Nurse Cortney P sent at 12/17/2022  4:19 PM EST ----- Regarding: Dr Bertis Ruddy - First time Taxol/Carbo Dr Bertis Ruddy, first time Taxol/Carbo, pt tolerated treatment well. No complaints.

## 2022-12-21 ENCOUNTER — Telehealth: Payer: Self-pay

## 2022-12-21 ENCOUNTER — Other Ambulatory Visit: Payer: Self-pay | Admitting: Hematology and Oncology

## 2022-12-21 DIAGNOSIS — C541 Malignant neoplasm of endometrium: Secondary | ICD-10-CM

## 2022-12-21 MED ORDER — TRAMADOL HCL 50 MG PO TABS: 50.0000 mg | ORAL_TABLET | Freq: Four times a day (QID) | ORAL | 0 refills | Status: DC | PRN

## 2022-12-21 NOTE — Telephone Encounter (Signed)
Daughter called back. Her Mom is complaining of aching all over that started on Sunday. She is complaining of right knee pain and had a popping in her knee on Sunday while in bed. Denies injury/fall. Today she is having some swelling to her right knee. She is using a heating pad/ applying Biofreeze to her knee.   She does not like to take pills. She has been taking tylenol but will only take 1 & 1/2 tabs. Instructed to take tylenol every 6 hours for aching/pain. She has 4 Tramadol tabs left over from surgery. Instructed to take a tramadol now for pain/aching.  Alice Hickman is having decreased appetite, her daughter is encouraging her to eat and drink boost shakes. Complained of nausea at times, denies vomiting. Took compazine earlier today and that helped. Denies constipation. Last BM Sunday.  Reminded to take Miralax BID and Senokot-S 2 tabs TID as needed to avoid constipation. Daughter verbalized understanding.  Any other recommendations? Daughter is asking if Tramadol needs to be sent to [facility] to have on hand.

## 2022-12-21 NOTE — Telephone Encounter (Signed)
I sent refill of tramadol to [facility] Unilateral knee swelling is not caused by chemo. It could be either due to injury or acute gout Tramadol or heat pad is ok or she needs eval with PCP for possible acute gout (she is already on gout medicine)

## 2022-12-21 NOTE — Telephone Encounter (Signed)
Returned daughters call and left a message. Alice Hickman is complaining of all over pain.

## 2022-12-21 NOTE — Telephone Encounter (Signed)
Called and given below message to daughter. She verbalized understanding. She will call PCP for appt.  She ask what her Mom can take for acid reflux because she has been complaining of some burning in her throat. Told her to try tum's and if it does not get better to call the office back. She verbalized understanding.

## 2022-12-23 LAB — SURGICAL PATHOLOGY

## 2023-01-04 NOTE — Progress Notes (Signed)
Gynecologic Oncology Post-operative Follow Up  Reason for Visit: follow-up post-op  Treatment History: Oncology History Overview Note  High grade serous, MMR normal, 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    10/28/2022 Imaging   CT C/A/P: 1. Abnormally thickened endometrium (27 mm thickness), compatible with known primary endometrial malignancy. 2. No evidence of metastatic disease in the chest, abdomen or pelvis. 3. Moderate diffuse colonic diverticulosis. 4. One vessel coronary atherosclerosis. 5.  Aortic Atherosclerosis (ICD10-I70.0).   11/18/2022 Initial Diagnosis   Endometrial cancer (HCC)   11/18/2022 Surgery   Robotic-assisted laparoscopic total hysterectomy with bilateral salpingoophorectomy, SLN biopsy bilaterally, omentectomy, cystoscopy, vaginal repair   Findings: On EUA, enlarged mobile uterus. On intra-abdominal entry, normal upper abodminal survey. Normal omentum, small and large bowel. Uterus 12 cm with 4-5 cm calcified anterior fundal fibroid, mildly bulbous. Normal apeparing bilateral adnexa. Mapping successful to bilateral obturator SLNs, no obvious adenopathy. Right sulcal and right sidewall vaginal lacerations due to vaginal delivery of specimen, repaired. On cystoscopy, intact dome, good efflux from bilateral ureteral orifices.    11/25/2022 Cancer Staging   Staging form: Corpus Uteri - Carcinoma and Carcinosarcoma, AJCC 8th Edition - Pathologic stage from 11/25/2022: FIGO Stage II, calculated as Stage IB (pT1b, pN0, cM0) - Signed by Artis Delay, MD on 11/25/2022 Stage prefix: Initial diagnosis   12/10/2022 Procedure   Successful placement of a right IJ approach Power Port with ultrasound and fluoroscopic guidance. The catheter is ready for use.   12/17/2022 -  Chemotherapy   Patient is on Treatment Plan : UTERINE Carboplatin AUC 6 + Paclitaxel  q21d       Interval History: The patient presents today to the office with her daughter for postoperative follow-up.  She reports doing well since surgery.  She is tolerating her diet with no nausea or emesis.  Daughter does state that she has a decreased appetite at times.  No abdominal pain reported.  Incisions are healing without issues.  She is emptying her bladder without difficulty and denies dysuria and hematuria.  No vaginal bleeding or discharge reported.  She does state seeing stitch material on 1 occasion when wiping.  Bowels are functioning without difficulty.  She continues to take a once daily stool softener to avoid constipation.  No lower extremity edema, erythema, or pain reported.  She is overall doing well.  Past Medical/Surgical History: Past Medical History:  Diagnosis Date   ALLERGIC RHINITIS 05/01/2007   Allergy    Anemia    ANKLE PAIN, RIGHT 09/02/2009   ANXIETY, SITUATIONAL 09/02/2009   Cancer (HCC)    CARPAL TUNNEL SYNDROME, RIGHT 11/15/2006   Depression    DM w/o Complication Type II 11/15/2006   Generalized osteoarthritis 10/27/2012   bilat ankles, knees, hands and wrists   GERD 11/15/2006   GLAUCOMA 07/04/2008   Gout 11/14/2014   HYPERLIPIDEMIA 05/01/2007   HYPERTENSION 11/15/2006   HYPOKALEMIA 10/02/2008   Low back pain    OSTEOPENIA 09/02/2009   Peripheral neuropathy 11/14/2014   THUMB PAIN, RIGHT 04/02/2009    Past Surgical History:  Procedure Laterality Date   COLONOSCOPY     CYSTOSCOPY  11/18/2022   Procedure: CYSTOSCOPY;  Surgeon: Carver Fila, MD;  Location: [facility];  Service: Gynecology;;   IR IMAGING GUIDED PORT INSERTION  12/10/2022   NO PAST SURGERIES     ROBOTIC ASSISTED TOTAL HYSTERECTOMY WITH BILATERAL SALPINGO OOPHERECTOMY N/A  11/18/2022   Procedure: XI ROBOTIC ASSISTED TOTAL HYSTERECTOMY WITH BILATERAL SALPINGO OOPHORECTOMY WITH OMENTECTOMY;  Surgeon: Carver Fila, MD;  Location: [facility];  Service: Gynecology;   Laterality: N/A;   SENTINEL NODE BIOPSY N/A 11/18/2022   Procedure: SENTINEL NODE BIOPSY;  Surgeon: Carver Fila, MD;  Location: [facility];  Service: Gynecology;  Laterality: N/A;    Family History  Problem Relation Age of Onset   Heart disease Mother    Hypertension Mother    Cancer Brother        throat cancer, smoker   Cancer Brother    Breast cancer Granddaughter    Cancer Niece    Breast cancer Niece    Heart disease Other    Hypertension Other    Colon cancer Neg Hx    Colon polyps Neg Hx    Esophageal cancer Neg Hx    Stomach cancer Neg Hx    Rectal cancer Neg Hx     Social History   Socioeconomic History   Marital status: Single    Spouse name: Not on file   Number of children: 3   Years of education: Not on file   Highest education level: Not on file  Occupational History   Occupation: FLOATER    Employer: [employer]   Occupation: Education officer, environmental, as well as Statistician   Occupation: retired  Tobacco Use   Smoking status: Never   Smokeless tobacco: Never  Vaping Use   Vaping status: Never Used  Substance and Sexual Activity   Alcohol use: No   Drug use: No   Sexual activity: Not on file  Other Topics Concern   Not on file  Social History Narrative   Not on file   Social Determinants of Health   Financial Resource Strain: Not on file  Food Insecurity: No Food Insecurity (12/06/2022)   Hunger Vital Sign    Worried About Running Out of Food in the Last Year: Never true    Ran Out of Food in the Last Year: Never true  Transportation Needs: No Transportation Needs (12/06/2022)   PRAPARE - Administrator, Civil Service (Medical): No    Lack of Transportation (Non-Medical): No  Physical Activity: Not on file  Stress: Not on file  Social Connections: Not on file    Current Medications:  Current Outpatient Medications:    allopurinol (ZYLOPRIM) 100 MG tablet, Take 100 mg by mouth daily., Disp: , Rfl:    aspirin 81 MG chewable  tablet, Chew 81 mg by mouth daily., Disp: , Rfl:    Colchicine 0.6 MG CAPS, Take 0.6 mg by mouth 2 (two) times daily as needed (gout flare pain)., Disp: , Rfl:    dexamethasone (DECADRON) 4 MG tablet, Take 2 tabs at the night before and 2 tab the morning of chemotherapy, every 3 weeks, by mouth x 6 cycles, Disp: 24 tablet, Rfl: 6   ferrous sulfate 325 (65 FE) MG tablet, Take 325 mg by mouth daily with breakfast., Disp: , Rfl:    furosemide (LASIX) 20 MG tablet, Take 1 tablet (20 mg total) by mouth as needed., Disp: 60 tablet, Rfl: 2   glimepiride (AMARYL) 2 MG tablet, TAKE ONE TABLET BY MOUTH ONCE DAILY BEFORE BREAKFAST, Disp: 90 tablet, Rfl: 0   latanoprost (XALATAN) 0.005 % ophthalmic solution, Place 1 drop into both eyes at bedtime., Disp: , Rfl:    lidocaine-prilocaine (EMLA) cream, Apply to affected area once, Disp: 30 g, Rfl: 3  olmesartan-hydrochlorothiazide (BENICAR HCT) 40-25 MG tablet, TAKE ONE TABLET BY MOUTH ONCE DAILY. (Patient taking differently: Take 1 tablet by mouth daily.), Disp: 30 tablet, Rfl: 5   ondansetron (ZOFRAN) 8 MG tablet, Take 1 tablet (8 mg total) by mouth every 8 (eight) hours as needed for nausea or vomiting. Start on the third day after chemotherapy., Disp: 30 tablet, Rfl: 1   prochlorperazine (COMPAZINE) 10 MG tablet, Take 1 tablet (10 mg total) by mouth every 6 (six) hours as needed for nausea or vomiting., Disp: 30 tablet, Rfl: 1   rosuvastatin (CRESTOR) 20 MG tablet, Take 1 tablet (20 mg total) by mouth daily., Disp: 90 tablet, Rfl: 3   senna-docusate (SENOKOT-S) 8.6-50 MG tablet, Take 2 tablets by mouth at bedtime. For AFTER surgery, do not take if having diarrhea, Disp: 30 tablet, Rfl: 0   traMADol (ULTRAM) 50 MG tablet, Take 1 tablet (50 mg total) by mouth every 6 (six) hours as needed for severe pain (pain score 7-10)., Disp: 60 tablet, Rfl: 0   magic mouthwash (nystatin, lidocaine, diphenhydrAMINE, alum & mag hydroxide) suspension, Swish and spit 5 mls by  mouth 4 times a day for 7 days, Disp: 160 mL, Rfl: 0 No current facility-administered medications for this visit.  Facility-Administered Medications Ordered in Other Visits:    0.9 %  sodium chloride infusion, , Intravenous, Continuous, Gorsuch, Ni, MD, Last Rate: 10 mL/hr at 01/07/23 0951, New Bag at 01/07/23 0951   CARBOplatin (PARAPLATIN) 380 mg in sodium chloride 0.9 % 100 mL chemo infusion, 380 mg, Intravenous, Once, Gorsuch, Ni, MD   heparin lock flush 100 unit/mL, 500 Units, Intracatheter, Once PRN, Bertis Ruddy, Ni, MD   PACLitaxel (TAXOL) 252 mg in sodium chloride 0.9 % 250 mL chemo infusion (> 80mg /m2), 140 mg/m2 (Treatment Plan Recorded), Intravenous, Once, Gorsuch, Ni, MD, Last Rate: 97 mL/hr at 01/07/23 1133, 252 mg at 01/07/23 1133   sodium chloride flush (NS) 0.9 % injection 10 mL, 10 mL, Intracatheter, PRN, Bertis Ruddy, Ni, MD  Review of Symptoms: Pertinent positives as per HPI.  Physical Exam: General: Well developed, well nourished female in no acute distress. Alert and oriented x 3.  Cardiovascular: Regular rate and rhythm. S1 and S2 normal.  Lungs: Clear to auscultation bilaterally. No wheezes/crackles/rhonchi noted.  Skin: No rashes or lesions present. Back: No CVA tenderness.  Abdomen: Abdomen soft, non-tender and obese. Active bowel sounds in all quadrants. Abdominal incisions are healing well without masses, erythema, or drainage. Genitourinary:    Vulva/vagina: Normal external female genitalia. No lesions.    Vagina: Atrophic without any lesions. Speculum exam reveals cuff intact, suture visible, no blood or discharge.  Bimanual exam reveals cuff intact, no fluctuance or tenderness to palpation.    Extremities: No bilateral cyanosis, edema, or clubbing.    Laboratory & Radiologic Studies: A. RIGHT OBTURATOR SENTINEL LYMPH NODE, BIOPSY: - Lymph node, negative for carcinoma (0/1)  B. LEFT OBTURATOR SENTINEL LYMPH NODE BIOPSY: - Lymph node, negative for carcinoma  (0/1)  C. UTERUS, CERVIX, BILATERAL FALLOPIAN TUBES AND OVARIES, RESECTION: - High-grade serous carcinoma, presenting as 3 lesions measuring 1 cm, 2.9 cm and 4.6 cm - Carcinoma at the 4.6 cm lesion invades for a depth of 1.8 cm where myometrial thickness is 2.3 cm (78%) - Cervical stroma is not involved - Benign bilateral ovaries and fallopian tubes - No evidence of lymphovascular invasion - Benign leiomyomata - See oncology table  D. OMENTUM: - Portion of omentum, negative for carcinoma  ONCOLOGY TABLE:  UTERUS, CARCINOMA  OR CARCINOSARCOMA: Resection  Procedure: Total hysterectomy and bilateral salpingo-oophorectomy Histologic Type: Serous carcinoma Histologic Grade: High-grade Myometrial Invasion:      Depth of Myometrial Invasion (mm): 18 mm      Myometrial Thickness (mm): 23 mm      Percentage of Myometrial Invasion: 78% Uterine Serosa Involvement: Not identified Cervical stromal Involvement: Not identified Extent of involvement of other tissue/organs: Not identified Peritoneal/Ascitic Fluid: Negative for carcinoma Lymphovascular Invasion: Not identified Regional Lymph Nodes:      Pelvic Lymph Nodes Examined:                                  2 Sentinel                                  0 Non-sentinel                                  2 Total      Pelvic Lymph Nodes with Metastasis: 0                          Macrometastasis: (>2.0 mm): 0                          Micrometastasis: (>0.2 mm and < 2.0 mm): 0                          Isolated Tumor Cells (<0.2 mm): 0                          Laterality of Lymph Node with Tumor: Not applicable                          Extracapsular Extension: Not applicable      Para-aortic Lymph Nodes Examined:                                   0 Sentinel                                   0 non-sentinel                                   0 total Distant Metastasis:      Distant Site(s) Involved: Not applicable Pathologic Stage  Classification (pTNM, AJCC 8th Edition): pT1b, pN0 Ancillary Studies: MMR testing will be ordered Representative Tumor Block: C5 Comment(s): Pancytokeratin was performed on the lymph nodes and is negative.  Pelvic washings: No malignant cells identified   Assessment & Plan: Alice Hickman is a 72 y.o. woman with Stage IIC uterine serous carcinoma who presents for post-operative follow-up. MMR normal, HER2 equivocal, on FISH HER 2 negative.  Patient is overall doing well, meeting postoperative milestones.  Discussed continued expectations and restrictions. Although confined to the uterus, given high risk histology, recommendation remains for adjuvant treatment including chemotherapy and vaginal brachytherapy.  She has met  with Dr. Bertis Ruddy and Dr. Roselind Messier.  The patient and family were provided with an opportunity to ask questions and all were answered. The patient agreed with the plan and demonstrated an understanding of the instructions.   The patient was advised to call for any needs, concerns, or new symptoms. We will plan on follow up with our office after completion of treatment.  15 minutes of total time was spent for this patient encounter, including preparation, counseling with the patient and coordination of care, and documentation of the encounter.  Alice Mccreedy NP [facility] Oncology

## 2023-01-06 MED FILL — Fosaprepitant Dimeglumine For IV Infusion 150 MG (Base Eq): INTRAVENOUS | Qty: 5 | Status: AC

## 2023-01-07 ENCOUNTER — Encounter: Payer: Self-pay | Admitting: Hematology and Oncology

## 2023-01-07 ENCOUNTER — Inpatient Hospital Stay (HOSPITAL_BASED_OUTPATIENT_CLINIC_OR_DEPARTMENT_OTHER): Payer: Medicare HMO | Admitting: Gynecologic Oncology

## 2023-01-07 ENCOUNTER — Inpatient Hospital Stay: Payer: Medicare HMO | Attending: Gynecologic Oncology | Admitting: Hematology and Oncology

## 2023-01-07 ENCOUNTER — Other Ambulatory Visit (HOSPITAL_COMMUNITY): Payer: Self-pay

## 2023-01-07 ENCOUNTER — Inpatient Hospital Stay: Payer: Medicare HMO

## 2023-01-07 ENCOUNTER — Other Ambulatory Visit: Payer: Self-pay

## 2023-01-07 ENCOUNTER — Inpatient Hospital Stay: Payer: Medicare HMO | Attending: Gynecologic Oncology

## 2023-01-07 VITALS — BP 114/63 | HR 74 | Temp 98.2°F | Resp 16 | Ht 61.0 in | Wt 169.0 lb

## 2023-01-07 VITALS — BP 122/55 | HR 53 | Temp 97.6°F | Resp 18 | Ht 61.0 in | Wt 169.4 lb

## 2023-01-07 DIAGNOSIS — K1231 Oral mucositis (ulcerative) due to antineoplastic therapy: Secondary | ICD-10-CM | POA: Insufficient documentation

## 2023-01-07 DIAGNOSIS — D61818 Other pancytopenia: Secondary | ICD-10-CM | POA: Insufficient documentation

## 2023-01-07 DIAGNOSIS — C541 Malignant neoplasm of endometrium: Secondary | ICD-10-CM

## 2023-01-07 DIAGNOSIS — C55 Malignant neoplasm of uterus, part unspecified: Secondary | ICD-10-CM

## 2023-01-07 DIAGNOSIS — Z90710 Acquired absence of both cervix and uterus: Secondary | ICD-10-CM

## 2023-01-07 DIAGNOSIS — Z79899 Other long term (current) drug therapy: Secondary | ICD-10-CM | POA: Diagnosis not present

## 2023-01-07 DIAGNOSIS — Z5111 Encounter for antineoplastic chemotherapy: Secondary | ICD-10-CM | POA: Insufficient documentation

## 2023-01-07 DIAGNOSIS — L299 Pruritus, unspecified: Secondary | ICD-10-CM | POA: Insufficient documentation

## 2023-01-07 DIAGNOSIS — Z90722 Acquired absence of ovaries, bilateral: Secondary | ICD-10-CM

## 2023-01-07 DIAGNOSIS — K5909 Other constipation: Secondary | ICD-10-CM

## 2023-01-07 LAB — CMP (CANCER CENTER ONLY)
ALT: 16 U/L (ref 0–44)
AST: 15 U/L (ref 15–41)
Albumin: 3.9 g/dL (ref 3.5–5.0)
Alkaline Phosphatase: 49 U/L (ref 38–126)
Anion gap: 7 (ref 5–15)
BUN: 24 mg/dL — ABNORMAL HIGH (ref 8–23)
CO2: 27 mmol/L (ref 22–32)
Calcium: 9.9 mg/dL (ref 8.9–10.3)
Chloride: 105 mmol/L (ref 98–111)
Creatinine: 1.24 mg/dL — ABNORMAL HIGH (ref 0.44–1.00)
GFR, Estimated: 47 mL/min — ABNORMAL LOW (ref >60)
Glucose, Bld: 191 mg/dL — ABNORMAL HIGH (ref 70–99)
Potassium: 4.1 mmol/L (ref 3.5–5.1)
Sodium: 139 mmol/L (ref 135–145)
Total Bilirubin: 0.3 mg/dL (ref <1.2)
Total Protein: 6.8 g/dL (ref 6.5–8.1)

## 2023-01-07 LAB — CBC WITH DIFFERENTIAL (CANCER CENTER ONLY)
Abs Immature Granulocytes: 0.01 10*3/uL (ref 0.00–0.07)
Basophils Absolute: 0 10*3/uL (ref 0.0–0.1)
Basophils Relative: 0 %
Eosinophils Absolute: 0 10*3/uL (ref 0.0–0.5)
Eosinophils Relative: 0 %
HCT: 31.5 % — ABNORMAL LOW (ref 36.0–46.0)
Hemoglobin: 10.3 g/dL — ABNORMAL LOW (ref 12.0–15.0)
Immature Granulocytes: 0 %
Lymphocytes Relative: 16 %
Lymphs Abs: 0.5 10*3/uL — ABNORMAL LOW (ref 0.7–4.0)
MCH: 28.3 pg (ref 26.0–34.0)
MCHC: 32.7 g/dL (ref 30.0–36.0)
MCV: 86.5 fL (ref 80.0–100.0)
Monocytes Absolute: 0 10*3/uL — ABNORMAL LOW (ref 0.1–1.0)
Monocytes Relative: 1 %
Neutro Abs: 2.4 10*3/uL (ref 1.7–7.7)
Neutrophils Relative %: 83 %
Platelet Count: 186 10*3/uL (ref 150–400)
RBC: 3.64 MIL/uL — ABNORMAL LOW (ref 3.87–5.11)
RDW: 12.9 % (ref 11.5–15.5)
WBC Count: 2.9 10*3/uL — ABNORMAL LOW (ref 4.0–10.5)
nRBC: 0 % (ref 0.0–0.2)

## 2023-01-07 MED ORDER — PALONOSETRON HCL INJECTION 0.25 MG/5ML
0.2500 mg | Freq: Once | INTRAVENOUS | Status: AC
  Administered 2023-01-07: 0.25 mg via INTRAVENOUS
  Filled 2023-01-07: qty 5

## 2023-01-07 MED ORDER — FAMOTIDINE IN NACL 20-0.9 MG/50ML-% IV SOLN
20.0000 mg | Freq: Once | INTRAVENOUS | Status: AC
  Administered 2023-01-07: 20 mg via INTRAVENOUS
  Filled 2023-01-07: qty 50

## 2023-01-07 MED ORDER — SODIUM CHLORIDE 0.9 % IV SOLN
381.0000 mg | Freq: Once | INTRAVENOUS | Status: AC
  Administered 2023-01-07: 380 mg via INTRAVENOUS
  Filled 2023-01-07: qty 38

## 2023-01-07 MED ORDER — SODIUM CHLORIDE 0.9% FLUSH
10.0000 mL | Freq: Once | INTRAVENOUS | Status: AC
  Administered 2023-01-07: 10 mL

## 2023-01-07 MED ORDER — PACLITAXEL CHEMO INJECTION 300 MG/50ML
140.0000 mg/m2 | Freq: Once | INTRAVENOUS | Status: AC
  Administered 2023-01-07: 252 mg via INTRAVENOUS
  Filled 2023-01-07: qty 42

## 2023-01-07 MED ORDER — HEPARIN SOD (PORK) LOCK FLUSH 100 UNIT/ML IV SOLN
500.0000 [IU] | Freq: Once | INTRAVENOUS | Status: AC | PRN
  Administered 2023-01-07: 500 [IU]

## 2023-01-07 MED ORDER — SODIUM CHLORIDE 0.9% FLUSH
10.0000 mL | INTRAVENOUS | Status: DC | PRN
  Administered 2023-01-07: 10 mL

## 2023-01-07 MED ORDER — DEXAMETHASONE SODIUM PHOSPHATE 10 MG/ML IJ SOLN
10.0000 mg | Freq: Once | INTRAMUSCULAR | Status: AC
  Administered 2023-01-07: 10 mg via INTRAVENOUS
  Filled 2023-01-07: qty 1

## 2023-01-07 MED ORDER — SODIUM CHLORIDE 0.9 % IV SOLN
150.0000 mg | Freq: Once | INTRAVENOUS | Status: AC
  Administered 2023-01-07: 150 mg via INTRAVENOUS
  Filled 2023-01-07: qty 150

## 2023-01-07 MED ORDER — SODIUM CHLORIDE 0.9 % IV SOLN: INTRAVENOUS | Status: DC

## 2023-01-07 MED ORDER — NYSTATIN 100000 UNIT/ML MT SUSP
OROMUCOSAL | 0 refills | Status: DC
  Filled 2023-01-07: qty 160, 8d supply, fill #0

## 2023-01-07 MED ORDER — CETIRIZINE HCL 10 MG/ML IV SOLN
10.0000 mg | Freq: Once | INTRAVENOUS | Status: AC
  Administered 2023-01-07: 10 mg via INTRAVENOUS
  Filled 2023-01-07: qty 1

## 2023-01-07 NOTE — Assessment & Plan Note (Signed)
I recommend minor dose reduction along with Magic mouthwash with lidocaine swish and spit solution

## 2023-01-07 NOTE — Patient Instructions (Addendum)
You are healing well after surgery. Nothing in the vagina until after February 10, 2023.  You can take a tub bath now.  Continue with scheduled appointments with Dr. Bertis Ruddy and Dr. Roselind Messier for treatment. You will see Dr. Pricilla Holm when you finish treatment.  Please call the office for any needs or concerns.  Symptoms to report to your health care team include vaginal bleeding, rectal bleeding, bloating, weight loss without effort, new and persistent pain, new and  persistent fatigue, new leg swelling, new masses (i.e., bumps in your neck or groin), new and persistent cough, new and persistent nausea and vomiting, change in bowel or bladder habits, and any other concerns.

## 2023-01-07 NOTE — Patient Instructions (Signed)
 [facility]  Discharge Instructions: Thank you for choosing [facility] to provide your oncology and hematology care.   If you have a lab appointment with the Cancer Center, please go directly to the Cancer Center and check in at the registration area.   Wear comfortable clothing and clothing appropriate for easy access to any Portacath or PICC line.   We strive to give you quality time with your provider. You may need to reschedule your appointment if you arrive late (15 or more minutes).  Arriving late affects you and other patients whose appointments are after yours.  Also, if you miss three or more appointments without notifying the office, you may be dismissed from the clinic at the provider's discretion.      For prescription refill requests, have your pharmacy contact our office and allow 72 hours for refills to be completed.    Today you received the following chemotherapy and/or immunotherapy agents: Taxol, Carboplatin      To help prevent nausea and vomiting after your treatment, we encourage you to take your nausea medication as directed.  BELOW ARE SYMPTOMS THAT SHOULD BE REPORTED IMMEDIATELY: *FEVER GREATER THAN 100.4 F (38 C) OR HIGHER *CHILLS OR SWEATING *NAUSEA AND VOMITING THAT IS NOT CONTROLLED WITH YOUR NAUSEA MEDICATION *UNUSUAL SHORTNESS OF BREATH *UNUSUAL BRUISING OR BLEEDING *URINARY PROBLEMS (pain or burning when urinating, or frequent urination) *BOWEL PROBLEMS (unusual diarrhea, constipation, pain near the anus) TENDERNESS IN MOUTH AND THROAT WITH OR WITHOUT PRESENCE OF ULCERS (sore throat, sores in mouth, or a toothache) UNUSUAL RASH, SWELLING OR PAIN  UNUSUAL VAGINAL DISCHARGE OR ITCHING   Items with * indicate a potential emergency and should be followed up as soon as possible or go to the Emergency Department if any problems should occur.  Please show the CHEMOTHERAPY ALERT CARD or  IMMUNOTHERAPY ALERT CARD at check-in to the Emergency Department and triage nurse.  Should you have questions after your visit or need to cancel or reschedule your appointment, please contact [facility] - A DEPT OF [facility]  Dept: [phone]  and follow the prompts.  Office hours are 8:00 a.m. to 4:30 p.m. Monday - Friday. Please note that voicemails left after 4:00 p.m. may not be returned until the following business day.  We are closed weekends and major holidays. You have access to a nurse at all times for urgent questions. Please call the main number to the clinic Dept: [phone] and follow the prompts.   For any non-urgent questions, you may also contact your provider using MyChart. We now offer e-Visits for anyone 54 and older to request care online for non-urgent symptoms. For details visit [email or web address].   Also download the MyChart app! Go to the app store, search "MyChart", open the app, select [place], and log in with your MyChart username and password.

## 2023-01-07 NOTE — Progress Notes (Signed)
[facility] OFFICE PROGRESS NOTE  Patient Care Team: Diamantina Providence, FNP as PCP - General (Nurse Practitioner)  ASSESSMENT & PLAN:  Endometrial cancer [facility]) She has experienced multiple side effects including mucositis, worsening pancytopenia, constipation etc. Plan minor dose reduction due to significant side effects Will continue aggressive supportive care  Pancytopenia, acquired (HCC) Due to side effects of chemo Plan minor dose reduction  Mucositis due to antineoplastic therapy I recommend minor dose reduction along with Magic mouthwash with lidocaine swish and spit solution  Chronic constipation I warned her about risk of constipation while on treatment She will continue to take aggressive laxatives  Itchy scalp I did not see any signs of source of sores Recommend conservative approach  No orders of the defined types were placed in this encounter.   All questions were answered. The patient knows to call the clinic with any problems, questions or concerns. The total time spent in the appointment was 40 minutes encounter with patients including review of chart and various tests results, discussions about plan of care and coordination of care plan   Artis Delay, MD 01/07/2023 11:33 AM  INTERVAL HISTORY: Please see below for problem oriented charting. she returns for chemotherapy with her daughter She had various side effects She have some itchiness of her scalp and the daughter reported some sores but that they are not visible today She also have mucositis She have some constipation No nausea No worsening peripheral neuropathy  REVIEW OF SYSTEMS:   Constitutional: Denies fevers, chills or abnormal weight loss Eyes: Denies blurriness of vision Respiratory: Denies cough, dyspnea or wheezes Cardiovascular: Denies palpitation, chest discomfort or lower extremity swelling Lymphatics: Denies new lymphadenopathy or easy bruising Neurological:Denies  numbness, tingling or new weaknesses Behavioral/Psych: Mood is stable, no new changes  All other systems were reviewed with the patient and are negative.  I have reviewed the past medical history, past surgical history, social history and family history with the patient and they are unchanged from previous note.  ALLERGIES:  is allergic to penicillins, lovastatin, and metformin.  MEDICATIONS:  Current Outpatient Medications  Medication Sig Dispense Refill   allopurinol (ZYLOPRIM) 100 MG tablet Take 100 mg by mouth daily.     aspirin 81 MG chewable tablet Chew 81 mg by mouth daily.     Colchicine 0.6 MG CAPS Take 0.6 mg by mouth 2 (two) times daily as needed (gout flare pain).     dexamethasone (DECADRON) 4 MG tablet Take 2 tabs at the night before and 2 tab the morning of chemotherapy, every 3 weeks, by mouth x 6 cycles 24 tablet 6   ferrous sulfate 325 (65 FE) MG tablet Take 325 mg by mouth daily with breakfast.     furosemide (LASIX) 20 MG tablet Take 1 tablet (20 mg total) by mouth as needed. 60 tablet 2   glimepiride (AMARYL) 2 MG tablet TAKE ONE TABLET BY MOUTH ONCE DAILY BEFORE BREAKFAST 90 tablet 0   latanoprost (XALATAN) 0.005 % ophthalmic solution Place 1 drop into both eyes at bedtime.     lidocaine-prilocaine (EMLA) cream Apply to affected area once 30 g 3   magic mouthwash (nystatin, lidocaine, diphenhydrAMINE, alum & mag hydroxide) suspension Swish and spit 5 mls by mouth 4 times a day for 7 days 160 mL 0   olmesartan-hydrochlorothiazide (BENICAR HCT) 40-25 MG tablet TAKE ONE TABLET BY MOUTH ONCE DAILY. (Patient taking differently: Take 1 tablet by mouth daily.) 30 tablet 5   ondansetron (ZOFRAN) 8  MG tablet Take 1 tablet (8 mg total) by mouth every 8 (eight) hours as needed for nausea or vomiting. Start on the third day after chemotherapy. 30 tablet 1   prochlorperazine (COMPAZINE) 10 MG tablet Take 1 tablet (10 mg total) by mouth every 6 (six) hours as needed for nausea or  vomiting. 30 tablet 1   rosuvastatin (CRESTOR) 20 MG tablet Take 1 tablet (20 mg total) by mouth daily. 90 tablet 3   senna-docusate (SENOKOT-S) 8.6-50 MG tablet Take 2 tablets by mouth at bedtime. For AFTER surgery, do not take if having diarrhea 30 tablet 0   traMADol (ULTRAM) 50 MG tablet Take 1 tablet (50 mg total) by mouth every 6 (six) hours as needed for severe pain (pain score 7-10). 60 tablet 0   No current facility-administered medications for this visit.   Facility-Administered Medications Ordered in Other Visits  Medication Dose Route Frequency Provider Last Rate Last Admin   0.9 %  sodium chloride infusion   Intravenous Continuous Bertis Ruddy, Martena Emanuele, MD 10 mL/hr at 01/07/23 0951 New Bag at 01/07/23 0951   CARBOplatin (PARAPLATIN) 380 mg in sodium chloride 0.9 % 100 mL chemo infusion  380 mg Intravenous Once Bertis Ruddy, Christalyn Goertz, MD       heparin lock flush 100 unit/mL  500 Units Intracatheter Once PRN Bertis Ruddy, Tniya Bowditch, MD       PACLitaxel (TAXOL) 252 mg in sodium chloride 0.9 % 250 mL chemo infusion (> 80mg /m2)  140 mg/m2 (Treatment Plan Recorded) Intravenous Once Halah Whiteside, MD       sodium chloride flush (NS) 0.9 % injection 10 mL  10 mL Intracatheter PRN Bertis Ruddy, Siona Coulston, MD        SUMMARY OF ONCOLOGIC HISTORY: Oncology History Overview Note  High grade serous, MMR normal, 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    10/28/2022 Imaging   CT C/A/P: 1. Abnormally thickened endometrium (27 mm thickness), compatible with known primary endometrial malignancy. 2. No evidence of metastatic disease in the chest, abdomen or pelvis. 3. Moderate diffuse colonic diverticulosis. 4. One vessel coronary atherosclerosis. 5.  Aortic Atherosclerosis (ICD10-I70.0).   11/18/2022 Initial Diagnosis   Endometrial cancer (HCC)   11/18/2022 Surgery   Robotic-assisted laparoscopic total hysterectomy with  bilateral salpingoophorectomy, SLN biopsy bilaterally, omentectomy, cystoscopy, vaginal repair   Findings: On EUA, enlarged mobile uterus. On intra-abdominal entry, normal upper abodminal survey. Normal omentum, small and large bowel. Uterus 12 cm with 4-5 cm calcified anterior fundal fibroid, mildly bulbous. Normal apeparing bilateral adnexa. Mapping successful to bilateral obturator SLNs, no obvious adenopathy. Right sulcal and right sidewall vaginal lacerations due to vaginal delivery of specimen, repaired. On cystoscopy, intact dome, good efflux from bilateral ureteral orifices.    11/25/2022 Cancer Staging   Staging form: Corpus Uteri - Carcinoma and Carcinosarcoma, AJCC 8th Edition - Pathologic stage from 11/25/2022: FIGO Stage II, calculated as Stage IB (pT1b, pN0, cM0) - Signed by Artis Delay, MD on 11/25/2022 Stage prefix: Initial diagnosis   12/10/2022 Procedure   Successful placement of a right IJ approach Power Port with ultrasound and fluoroscopic guidance. The catheter is ready for use.   12/17/2022 -  Chemotherapy   Patient is on Treatment Plan : UTERINE Carboplatin AUC 6 + Paclitaxel q21d       PHYSICAL EXAMINATION: ECOG PERFORMANCE STATUS: 1 - Symptomatic but completely ambulatory  Vitals:   01/07/23 0904  BP: (!) 122/55  Pulse: (!) 53  Resp: 18  Temp: 97.6 F (36.4 C)  SpO2: 100%   Filed Weights   01/07/23 0904  Weight: 169 lb 6.4 oz (76.8 kg)    GENERAL:alert, no distress and comfortable I did not see any lesions on her scalp  LABORATORY DATA:  I have reviewed the data as listed    Component Value Date/Time   NA 139 01/07/2023 0841   NA 137 12/14/2019 0908   K 4.1 01/07/2023 0841   CL 105 01/07/2023 0841   CO2 27 01/07/2023 0841   GLUCOSE 191 (H) 01/07/2023 0841   BUN 24 (H) 01/07/2023 0841   BUN 25 12/14/2019 0908   CREATININE 1.24 (H) 01/07/2023 0841   CALCIUM 9.9 01/07/2023 0841   PROT 6.8 01/07/2023 0841   ALBUMIN 3.9 01/07/2023 0841   AST 15  01/07/2023 0841   ALT 16 01/07/2023 0841   ALKPHOS 49 01/07/2023 0841   BILITOT 0.3 01/07/2023 0841   GFRNONAA 47 (L) 01/07/2023 0841   GFRAA 52 (L) 12/14/2019 0908    No results found for: "SPEP", "UPEP"  Lab Results  Component Value Date   WBC 2.9 (L) 01/07/2023   NEUTROABS 2.4 01/07/2023   HGB 10.3 (L) 01/07/2023   HCT 31.5 (L) 01/07/2023   MCV 86.5 01/07/2023   PLT 186 01/07/2023      Chemistry      Component Value Date/Time   NA 139 01/07/2023 0841   NA 137 12/14/2019 0908   K 4.1 01/07/2023 0841   CL 105 01/07/2023 0841   CO2 27 01/07/2023 0841   BUN 24 (H) 01/07/2023 0841   BUN 25 12/14/2019 0908   CREATININE 1.24 (H) 01/07/2023 0841      Component Value Date/Time   CALCIUM 9.9 01/07/2023 0841   ALKPHOS 49 01/07/2023 0841   AST 15 01/07/2023 0841   ALT 16 01/07/2023 0841   BILITOT 0.3 01/07/2023 0841

## 2023-01-07 NOTE — Assessment & Plan Note (Signed)
Due to side effects of chemo Plan minor dose reduction

## 2023-01-07 NOTE — Assessment & Plan Note (Signed)
I did not see any signs of source of sores Recommend conservative approach

## 2023-01-07 NOTE — Assessment & Plan Note (Signed)
I warned her about risk of constipation while on treatment She will continue to take aggressive laxatives

## 2023-01-07 NOTE — Assessment & Plan Note (Signed)
She has experienced multiple side effects including mucositis, worsening pancytopenia, constipation etc. Plan minor dose reduction due to significant side effects Will continue aggressive supportive care

## 2023-01-11 ENCOUNTER — Telehealth: Payer: Self-pay | Admitting: Hematology and Oncology

## 2023-01-11 NOTE — Telephone Encounter (Signed)
 Left patient a message in regards to scheduled appointment times/dates

## 2023-01-12 ENCOUNTER — Other Ambulatory Visit (HOSPITAL_COMMUNITY): Payer: Self-pay

## 2023-01-13 ENCOUNTER — Emergency Department (HOSPITAL_COMMUNITY): Payer: Medicare HMO

## 2023-01-13 ENCOUNTER — Encounter (HOSPITAL_COMMUNITY): Payer: Self-pay | Admitting: Emergency Medicine

## 2023-01-13 ENCOUNTER — Other Ambulatory Visit: Payer: Self-pay

## 2023-01-13 ENCOUNTER — Emergency Department (HOSPITAL_COMMUNITY)
Admission: EM | Admit: 2023-01-13 | Discharge: 2023-01-13 | Disposition: A | Discharge status: Home / Self Care | Payer: Medicare HMO | Attending: Emergency Medicine | Admitting: Emergency Medicine

## 2023-01-13 DIAGNOSIS — Z7982 Long term (current) use of aspirin: Secondary | ICD-10-CM | POA: Insufficient documentation

## 2023-01-13 DIAGNOSIS — E860 Dehydration: Secondary | ICD-10-CM | POA: Diagnosis not present

## 2023-01-13 DIAGNOSIS — K5730 Diverticulosis of large intestine without perforation or abscess without bleeding: Secondary | ICD-10-CM | POA: Insufficient documentation

## 2023-01-13 DIAGNOSIS — R55 Syncope and collapse: Secondary | ICD-10-CM | POA: Diagnosis present

## 2023-01-13 LAB — COMPREHENSIVE METABOLIC PANEL
ALT: 31 U/L (ref 0–44)
AST: 33 U/L (ref 15–41)
Albumin: 3.5 g/dL (ref 3.5–5.0)
Alkaline Phosphatase: 43 U/L (ref 38–126)
Anion gap: 9 (ref 5–15)
BUN: 23 mg/dL (ref 8–23)
CO2: 25 mmol/L (ref 22–32)
Calcium: 9.1 mg/dL (ref 8.9–10.3)
Chloride: 102 mmol/L (ref 98–111)
Creatinine, Ser: 1.21 mg/dL — ABNORMAL HIGH (ref 0.44–1.00)
GFR, Estimated: 48 mL/min — ABNORMAL LOW (ref >60)
Glucose, Bld: 172 mg/dL — ABNORMAL HIGH (ref 70–99)
Potassium: 3.6 mmol/L (ref 3.5–5.1)
Sodium: 136 mmol/L (ref 135–145)
Total Bilirubin: 0.7 mg/dL (ref <1.2)
Total Protein: 6.6 g/dL (ref 6.5–8.1)

## 2023-01-13 LAB — CBC WITH DIFFERENTIAL/PLATELET
Abs Immature Granulocytes: 0.01 10*3/uL (ref 0.00–0.07)
Basophils Absolute: 0 10*3/uL (ref 0.0–0.1)
Basophils Relative: 1 %
Eosinophils Absolute: 0 10*3/uL (ref 0.0–0.5)
Eosinophils Relative: 1 %
HCT: 31.6 % — ABNORMAL LOW (ref 36.0–46.0)
Hemoglobin: 10 g/dL — ABNORMAL LOW (ref 12.0–15.0)
Immature Granulocytes: 1 %
Lymphocytes Relative: 33 %
Lymphs Abs: 0.6 10*3/uL — ABNORMAL LOW (ref 0.7–4.0)
MCH: 28.1 pg (ref 26.0–34.0)
MCHC: 31.6 g/dL (ref 30.0–36.0)
MCV: 88.8 fL (ref 80.0–100.0)
Monocytes Absolute: 0 10*3/uL — ABNORMAL LOW (ref 0.1–1.0)
Monocytes Relative: 2 %
Neutro Abs: 1.2 10*3/uL — ABNORMAL LOW (ref 1.7–7.7)
Neutrophils Relative %: 62 %
Platelets: 172 10*3/uL (ref 150–400)
RBC: 3.56 MIL/uL — ABNORMAL LOW (ref 3.87–5.11)
RDW: 13.5 % (ref 11.5–15.5)
Smear Review: NORMAL
WBC: 1.9 10*3/uL — ABNORMAL LOW (ref 4.0–10.5)
nRBC: 0 % (ref 0.0–0.2)

## 2023-01-13 MED ORDER — SODIUM CHLORIDE 0.9 % IV BOLUS
1000.0000 mL | Freq: Once | INTRAVENOUS | Status: AC
  Administered 2023-01-13: 1000 mL via INTRAVENOUS

## 2023-01-13 MED ORDER — HYDROMORPHONE HCL 1 MG/ML IJ SOLN
0.5000 mg | Freq: Once | INTRAMUSCULAR | Status: DC
  Filled 2023-01-13: qty 1

## 2023-01-13 MED ORDER — ONDANSETRON HCL 4 MG/2ML IJ SOLN
4.0000 mg | Freq: Once | INTRAMUSCULAR | Status: DC
  Filled 2023-01-13: qty 2

## 2023-01-13 MED ORDER — IOHEXOL 300 MG/ML  SOLN
100.0000 mL | Freq: Once | INTRAMUSCULAR | Status: AC | PRN
  Administered 2023-01-13: 100 mL via INTRAVENOUS

## 2023-01-13 NOTE — Discharge Instructions (Signed)
Follow-up with your family doctor next week for recheck. 

## 2023-01-13 NOTE — ED Triage Notes (Signed)
Patient presents form home due to a near syncopal episode. She has not had a good bowel movement in a week and tried by pushing down hard while in the toilet. This was followed by a syncopal episode. While on the toilet, Fire noted a palpated BP of 80. Recently she has had complaints of abdominal pain and low PO intake. Last Chemo was last week.    HX: Hysterectomy, colon cancer  EMS vitals: 122/66 BP 70 HR 100% SPO2 on room air 237 CBG

## 2023-01-13 NOTE — ED Provider Notes (Signed)
[place] EMERGENCY DEPARTMENT AT [facility] Provider Note   CSN: [id number] Arrival date & time: 01/13/23  1121     History  Chief Complaint  Patient presents with   Loss of Consciousness    Alice Hickman is a 72 y.o. female.  Patient was trying to have a bowel movement and had a near syncopal episode.  She feels fine now  The history is provided by the patient and medical records. No language interpreter was used.  Loss of Consciousness Episode history:  Single Most recent episode:  Today Timing:  Unable to specify Progression:  Resolved Chronicity:  New Context: not blood draw   Witnessed: no   Relieved by:  Nothing Associated symptoms: dizziness   Associated symptoms: no chest pain, no headaches and no seizures        Home Medications Prior to Admission medications   Medication Sig Start Date End Date Taking? Authorizing Provider  allopurinol (ZYLOPRIM) 100 MG tablet Take 100 mg by mouth daily.    [provider]  aspirin 81 MG chewable tablet Chew 81 mg by mouth daily.    [provider]  Colchicine 0.6 MG CAPS Take 0.6 mg by mouth 2 (two) times daily as needed (gout flare pain).    [provider]  dexamethasone (DECADRON) 4 MG tablet Take 2 tabs at the night before and 2 tab the morning of chemotherapy, every 3 weeks, by mouth x 6 cycles 11/30/22   Artis Delay, MD  ferrous sulfate 325 (65 FE) MG tablet Take 325 mg by mouth daily with breakfast.    [provider]  furosemide (LASIX) 20 MG tablet Take 1 tablet (20 mg total) by mouth as needed. 08/12/21   Patwardhan, Anabel Bene, MD  glimepiride (AMARYL) 2 MG tablet TAKE ONE TABLET BY MOUTH ONCE DAILY BEFORE BREAKFAST 11/06/15   Corwin Levins, MD  latanoprost (XALATAN) 0.005 % ophthalmic solution Place 1 drop into both eyes at bedtime.    [provider]  lidocaine-prilocaine (EMLA) cream Apply to affected area once 12/16/22   Artis Delay, MD  magic mouthwash  (nystatin, lidocaine, diphenhydrAMINE, alum & mag hydroxide) suspension Swish and spit 5 mls by mouth 4 times a day for 7 days 01/07/23   Artis Delay, MD  olmesartan-hydrochlorothiazide (BENICAR HCT) 40-25 MG tablet TAKE ONE TABLET BY MOUTH ONCE DAILY. Patient taking differently: Take 1 tablet by mouth daily. 11/06/15   Corwin Levins, MD  ondansetron (ZOFRAN) 8 MG tablet Take 1 tablet (8 mg total) by mouth every 8 (eight) hours as needed for nausea or vomiting. Start on the third day after chemotherapy. 12/16/22   Artis Delay, MD  prochlorperazine (COMPAZINE) 10 MG tablet Take 1 tablet (10 mg total) by mouth every 6 (six) hours as needed for nausea or vomiting. 12/16/22   Artis Delay, MD  rosuvastatin (CRESTOR) 20 MG tablet Take 1 tablet (20 mg total) by mouth daily. 11/05/22 02/03/23  Patwardhan, Anabel Bene, MD  senna-docusate (SENOKOT-S) 8.6-50 MG tablet Take 2 tablets by mouth at bedtime. For AFTER surgery, do not take if having diarrhea 10/22/22   Carver Fila, MD  traMADol (ULTRAM) 50 MG tablet Take 1 tablet (50 mg total) by mouth every 6 (six) hours as needed for severe pain (pain score 7-10). 12/21/22   Artis Delay, MD      Allergies    Penicillins, Lovastatin, and Metformin    Review of Systems   Review of Systems  Constitutional:  Negative  for appetite change and fatigue.  HENT:  Negative for congestion, ear discharge and sinus pressure.   Eyes:  Negative for discharge.  Respiratory:  Negative for cough.   Cardiovascular:  Positive for syncope. Negative for chest pain.  Gastrointestinal:  Negative for abdominal pain and diarrhea.  Genitourinary:  Negative for frequency and hematuria.  Musculoskeletal:  Negative for back pain.  Skin:  Negative for rash.  Neurological:  Positive for dizziness. Negative for seizures and headaches.  Psychiatric/Behavioral:  Negative for hallucinations.     Physical Exam Updated Vital Signs BP (!) 126/56   Pulse 70   Temp 98.1 F (36.7 C) (Oral)    Resp 14   SpO2 100%  Physical Exam Vitals and nursing note reviewed.  Constitutional:      Appearance: She is well-developed.  HENT:     Head: Normocephalic.     Nose: Nose normal.  Eyes:     General: No scleral icterus.    Conjunctiva/sclera: Conjunctivae normal.  Neck:     Thyroid: No thyromegaly.  Cardiovascular:     Rate and Rhythm: Normal rate and regular rhythm.     Heart sounds: No murmur heard.    No friction rub. No gallop.  Pulmonary:     Breath sounds: No stridor. No wheezing or rales.  Chest:     Chest wall: No tenderness.  Abdominal:     General: There is no distension.     Tenderness: There is no abdominal tenderness. There is no rebound.  Genitourinary:    Rectum: Normal.  Musculoskeletal:        General: Normal range of motion.     Cervical back: Neck supple.  Lymphadenopathy:     Cervical: No cervical adenopathy.  Skin:    Findings: No erythema or rash.  Neurological:     Mental Status: She is alert and oriented to person, place, and time.     Motor: No abnormal muscle tone.     Coordination: Coordination normal.  Psychiatric:        Behavior: Behavior normal.     ED Results / Procedures / Treatments   Labs (all labs ordered are listed, but only abnormal results are displayed) Labs Reviewed  CBC WITH DIFFERENTIAL/PLATELET - Abnormal; Notable for the following components:      Result Value   WBC 1.9 (*)    RBC 3.56 (*)    Hemoglobin 10.0 (*)    HCT 31.6 (*)    Neutro Abs 1.2 (*)    Lymphs Abs 0.6 (*)    Monocytes Absolute 0.0 (*)    All other components within normal limits  COMPREHENSIVE METABOLIC PANEL - Abnormal; Notable for the following components:   Glucose, Bld 172 (*)    Creatinine, Ser 1.21 (*)    GFR, Estimated 48 (*)    All other components within normal limits  URINALYSIS, ROUTINE W REFLEX MICROSCOPIC    EKG None  Radiology CT ABDOMEN PELVIS W CONTRAST Result Date: 01/13/2023 CLINICAL DATA:  Acute abdominal pain.  Constipation. High-grade endometrial carcinoma. * Tracking Code: BO * EXAM: CT ABDOMEN AND PELVIS WITH CONTRAST TECHNIQUE: Multidetector CT imaging of the abdomen and pelvis was performed using the standard protocol following bolus administration of intravenous contrast. RADIATION DOSE REDUCTION: This exam was performed according to the departmental dose-optimization program which includes automated exposure control, adjustment of the mA and/or kV according to patient size and/or use of iterative reconstruction technique. CONTRAST:  OMNIPAQUE IOHEXOL 300 MG/ML  SOLN COMPARISON:  10/28/2022 FINDINGS: Lower Chest: No acute findings. Hepatobiliary: No suspicious hepatic masses identified. Gallbladder is unremarkable. No evidence of biliary ductal dilatation. Pancreas:  No mass or inflammatory changes. Spleen: Within normal limits in size and appearance. Adrenals/Urinary Tract: No suspicious masses identified. No evidence of ureteral calculi or hydronephrosis. Stomach/Bowel: No evidence of obstruction, inflammatory process or abnormal fluid collections. Normal appendix visualized. Diverticulosis is seen mainly involving the descending and sigmoid colon, however there is no evidence of diverticulitis. Vascular/Lymphatic: No pathologically enlarged lymph nodes. No acute vascular findings. Reproductive: Prior hysterectomy noted. Vaginal cuff and adnexal regions are unremarkable in appearance. No No evidence of mass or ascites. Other:  None. Musculoskeletal:  No suspicious bone lesions identified. IMPRESSION: No acute findings. Colonic diverticulosis, without radiographic evidence of diverticulitis. Prior hysterectomy. No evidence of recurrent or metastatic carcinoma. Electronically Signed   By: Danae Orleans M.D.   On: 01/13/2023 15:11    Procedures Procedures    Medications Ordered in ED Medications  ondansetron (ZOFRAN) injection 4 mg (4 mg Intravenous Patient Refused/Not Given 01/13/23 1214)   HYDROmorphone (DILAUDID) injection 0.5 mg (0.5 mg Intravenous Patient Refused/Not Given 01/13/23 1214)  sodium chloride 0.9 % bolus 1,000 mL (1,000 mLs Intravenous New Bag/Given 01/13/23 1213)  iohexol (OMNIPAQUE) 300 MG/ML solution 100 mL (100 mLs Intravenous Contrast Given 01/13/23 1423)    ED Course/ Medical Decision Making/ A&P                                 Medical Decision Making Amount and/or Complexity of Data Reviewed Labs: ordered. Radiology: ordered.  Risk Prescription drug management.  This patient presents to the ED for concern of syncope while having a bowel movement, this involves an extensive number of treatment options, and is a complaint that carries with it a high risk of complications and morbidity.  The differential diagnosis includes dehydration arrhythmia   Co morbidities that complicate the patient evaluation  Hypertension diabetes   Additional history obtained:  Additional history obtained from patient External records from outside source obtained and reviewed including half old record   Lab Tests:  I Ordered, and personally interpreted labs.  The pertinent results include: Hemoglobin 10   Imaging Studies ordered:  I ordered imaging studies including CT abdomen I independently visualized and interpreted imaging which showed negative I agree with the radiologist interpretation   Cardiac Monitoring: / EKG:  The patient was maintained on a cardiac monitor.  I personally viewed and interpreted the cardiac monitored which showed an underlying rhythm of: Normal sinus rhythm   Consultations Obtained:  No consultant Problem List / ED Course / Critical interventions / Medication management  Syncope I ordered medication including saline Reevaluation of the patient after these medicines showed that the patient improved I have reviewed the patients home medicines and have made adjustments as needed   Social Determinants of  Health:  None   Test / Admission - Considered:  None  Pt with near syncopal episode while trying to have a bowel movement.  Patient improved with fluids and will follow-up with her PCP        Final Clinical Impression(s) / ED Diagnoses Final diagnoses:  Dehydration    Rx / DC Orders ED Discharge Orders     None         Bethann Berkshire, MD 01/17/23 [phone]

## 2023-01-14 ENCOUNTER — Observation Stay (HOSPITAL_COMMUNITY)
Admission: EM | Admit: 2023-01-14 | Discharge: 2023-01-15 | Disposition: A | Discharge status: Home / Self Care | Payer: Medicare HMO | Attending: Internal Medicine | Admitting: Internal Medicine

## 2023-01-14 ENCOUNTER — Other Ambulatory Visit (HOSPITAL_COMMUNITY): Payer: Self-pay

## 2023-01-14 DIAGNOSIS — C541 Malignant neoplasm of endometrium: Secondary | ICD-10-CM

## 2023-01-14 DIAGNOSIS — Z79899 Other long term (current) drug therapy: Secondary | ICD-10-CM | POA: Diagnosis not present

## 2023-01-14 DIAGNOSIS — K625 Hemorrhage of anus and rectum: Secondary | ICD-10-CM | POA: Diagnosis not present

## 2023-01-14 DIAGNOSIS — K921 Melena: Secondary | ICD-10-CM

## 2023-01-14 DIAGNOSIS — I951 Orthostatic hypotension: Secondary | ICD-10-CM | POA: Diagnosis not present

## 2023-01-14 DIAGNOSIS — E1140 Type 2 diabetes mellitus with diabetic neuropathy, unspecified: Secondary | ICD-10-CM | POA: Insufficient documentation

## 2023-01-14 DIAGNOSIS — D61818 Other pancytopenia: Secondary | ICD-10-CM | POA: Diagnosis not present

## 2023-01-14 DIAGNOSIS — E785 Hyperlipidemia, unspecified: Secondary | ICD-10-CM | POA: Diagnosis not present

## 2023-01-14 DIAGNOSIS — K5900 Constipation, unspecified: Secondary | ICD-10-CM | POA: Diagnosis not present

## 2023-01-14 DIAGNOSIS — K5903 Drug induced constipation: Secondary | ICD-10-CM

## 2023-01-14 DIAGNOSIS — R770 Abnormality of albumin: Secondary | ICD-10-CM | POA: Insufficient documentation

## 2023-01-14 DIAGNOSIS — Z8542 Personal history of malignant neoplasm of other parts of uterus: Secondary | ICD-10-CM | POA: Insufficient documentation

## 2023-01-14 DIAGNOSIS — I10 Essential (primary) hypertension: Secondary | ICD-10-CM | POA: Diagnosis not present

## 2023-01-14 DIAGNOSIS — E875 Hyperkalemia: Secondary | ICD-10-CM | POA: Insufficient documentation

## 2023-01-14 LAB — FOLATE: Folate: 10.5 ng/mL (ref >5.9)

## 2023-01-14 LAB — BASIC METABOLIC PANEL
Anion gap: 7 (ref 5–15)
Anion gap: 8 (ref 5–15)
BUN: 16 mg/dL (ref 8–23)
BUN: 15 mg/dL (ref 8–23)
CO2: 25 mmol/L (ref 22–32)
CO2: 25 mmol/L (ref 22–32)
Calcium: 9.2 mg/dL (ref 8.9–10.3)
Calcium: 8.8 mg/dL — ABNORMAL LOW (ref 8.9–10.3)
Chloride: 103 mmol/L (ref 98–111)
Chloride: 101 mmol/L (ref 98–111)
Creatinine, Ser: 1.09 mg/dL — ABNORMAL HIGH (ref 0.44–1.00)
Creatinine, Ser: 0.99 mg/dL (ref 0.44–1.00)
GFR, Estimated: 55 mL/min — ABNORMAL LOW (ref >60)
GFR, Estimated: >60 mL/min (ref >60)
Glucose, Bld: 90 mg/dL (ref 70–99)
Glucose, Bld: 117 mg/dL — ABNORMAL HIGH (ref 70–99)
Potassium: 3.4 mmol/L — ABNORMAL LOW (ref 3.5–5.1)
Potassium: 3.3 mmol/L — ABNORMAL LOW (ref 3.5–5.1)
Sodium: 135 mmol/L (ref 135–145)
Sodium: 134 mmol/L — ABNORMAL LOW (ref 135–145)

## 2023-01-14 LAB — CBC WITH DIFFERENTIAL/PLATELET
Abs Immature Granulocytes: 0.01 10*3/uL (ref 0.00–0.07)
Basophils Absolute: 0 10*3/uL (ref 0.0–0.1)
Basophils Relative: 1 %
Eosinophils Absolute: 0 10*3/uL (ref 0.0–0.5)
Eosinophils Relative: 2 %
HCT: 31.1 % — ABNORMAL LOW (ref 36.0–46.0)
Hemoglobin: 9.8 g/dL — ABNORMAL LOW (ref 12.0–15.0)
Immature Granulocytes: 1 %
Lymphocytes Relative: 61 %
Lymphs Abs: 1.1 10*3/uL (ref 0.7–4.0)
MCH: 27.5 pg (ref 26.0–34.0)
MCHC: 31.5 g/dL (ref 30.0–36.0)
MCV: 87.1 fL (ref 80.0–100.0)
Monocytes Absolute: 0.1 10*3/uL (ref 0.1–1.0)
Monocytes Relative: 4 %
Neutro Abs: 0.6 10*3/uL — ABNORMAL LOW (ref 1.7–7.7)
Neutrophils Relative %: 31 %
Platelet Morphology: NORMAL
Platelets: 160 10*3/uL (ref 150–400)
RBC: 3.57 MIL/uL — ABNORMAL LOW (ref 3.87–5.11)
RDW: 13.5 % (ref 11.5–15.5)
WBC: 1.8 10*3/uL — ABNORMAL LOW (ref 4.0–10.5)
nRBC: 0 % (ref 0.0–0.2)

## 2023-01-14 LAB — I-STAT CHEM 8, ED
BUN: 18 mg/dL (ref 8–23)
Calcium, Ion: 0.91 mmol/L — ABNORMAL LOW (ref 1.15–1.40)
Chloride: 108 mmol/L (ref 98–111)
Creatinine, Ser: 1.1 mg/dL — ABNORMAL HIGH (ref 0.44–1.00)
Glucose, Bld: 82 mg/dL (ref 70–99)
HCT: 31 % — ABNORMAL LOW (ref 36.0–46.0)
Hemoglobin: 10.5 g/dL — ABNORMAL LOW (ref 12.0–15.0)
Potassium: 5.2 mmol/L — ABNORMAL HIGH (ref 3.5–5.1)
Sodium: 136 mmol/L (ref 135–145)
TCO2: 26 mmol/L (ref 22–32)

## 2023-01-14 LAB — IRON AND TIBC
Iron: 175 ug/dL — ABNORMAL HIGH (ref 28–170)
Saturation Ratios: 59 % — ABNORMAL HIGH (ref 10.4–31.8)
TIBC: 297 ug/dL (ref 250–450)
UIBC: 122 ug/dL

## 2023-01-14 LAB — GLUCOSE, CAPILLARY
Glucose-Capillary: 66 mg/dL — ABNORMAL LOW (ref 70–99)
Glucose-Capillary: 110 mg/dL — ABNORMAL HIGH (ref 70–99)
Glucose-Capillary: 66 mg/dL — ABNORMAL LOW (ref 70–99)
Glucose-Capillary: 156 mg/dL — ABNORMAL HIGH (ref 70–99)

## 2023-01-14 LAB — FERRITIN: Ferritin: 725 ng/mL — ABNORMAL HIGH (ref 11–307)

## 2023-01-14 LAB — TYPE AND SCREEN
ABO/RH(D): O POS
Antibody Screen: NEGATIVE

## 2023-01-14 LAB — PROTIME-INR
INR: 1 (ref 0.8–1.2)
Prothrombin Time: 13.5 s (ref 11.4–15.2)

## 2023-01-14 LAB — VITAMIN B12: Vitamin B-12: 300 pg/mL (ref 180–914)

## 2023-01-14 LAB — HEMOGLOBIN AND HEMATOCRIT, BLOOD
HCT: 29.9 % — ABNORMAL LOW (ref 36.0–46.0)
Hemoglobin: 9.8 g/dL — ABNORMAL LOW (ref 12.0–15.0)

## 2023-01-14 LAB — POC OCCULT BLOOD, ED: Fecal Occult Bld: POSITIVE — AB

## 2023-01-14 LAB — APTT: aPTT: 22 s — ABNORMAL LOW (ref 24–36)

## 2023-01-14 MED ORDER — HYDROCORTISONE ACETATE 25 MG RE SUPP
25.0000 mg | Freq: Two times a day (BID) | RECTAL | Status: DC
  Administered 2023-01-14 – 2023-01-15 (×2): 25 mg via RECTAL
  Filled 2023-01-14 (×3): qty 1

## 2023-01-14 MED ORDER — ROSUVASTATIN CALCIUM 20 MG PO TABS: 20.0000 mg | ORAL_TABLET | Freq: Every day | ORAL | Status: DC

## 2023-01-14 MED ORDER — ONDANSETRON HCL 4 MG/2ML IJ SOLN: 4.0000 mg | Freq: Four times a day (QID) | INTRAMUSCULAR | Status: DC | PRN

## 2023-01-14 MED ORDER — CHLORHEXIDINE GLUCONATE CLOTH 2 % EX PADS
6.0000 | MEDICATED_PAD | Freq: Every day | CUTANEOUS | Status: DC
  Administered 2023-01-15: 6 via TOPICAL

## 2023-01-14 MED ORDER — SODIUM CHLORIDE 0.9% FLUSH
10.0000 mL | Freq: Two times a day (BID) | INTRAVENOUS | Status: DC
  Administered 2023-01-14: 10 mL

## 2023-01-14 MED ORDER — POLYETHYLENE GLYCOL 3350 17 G PO PACK
17.0000 g | PACK | Freq: Every day | ORAL | Status: DC
Start: 2023-01-14 — End: 2023-01-15
  Administered 2023-01-14: 17 g via ORAL
  Filled 2023-01-14: qty 1

## 2023-01-14 MED ORDER — TRAMADOL HCL 50 MG PO TABS: 50.0000 mg | ORAL_TABLET | Freq: Four times a day (QID) | ORAL | Status: DC | PRN

## 2023-01-14 MED ORDER — INSULIN ASPART 100 UNIT/ML IJ SOLN
0.0000 [IU] | Freq: Every day | INTRAMUSCULAR | Status: DC
  Filled 2023-01-14: qty 0.05

## 2023-01-14 MED ORDER — ONDANSETRON HCL 4 MG PO TABS: 4.0000 mg | ORAL_TABLET | Freq: Four times a day (QID) | ORAL | Status: DC | PRN

## 2023-01-14 MED ORDER — GLIMEPIRIDE 2 MG PO TABS: 2.0000 mg | ORAL_TABLET | Freq: Every day | ORAL | Status: DC

## 2023-01-14 MED ORDER — DOCUSATE SODIUM 100 MG PO CAPS
100.0000 mg | ORAL_CAPSULE | Freq: Two times a day (BID) | ORAL | Status: DC
  Administered 2023-01-14 – 2023-01-15 (×3): 100 mg via ORAL
  Filled 2023-01-14 (×3): qty 1

## 2023-01-14 MED ORDER — SODIUM CHLORIDE 0.9% FLUSH
3.0000 mL | Freq: Two times a day (BID) | INTRAVENOUS | Status: DC
  Administered 2023-01-14: 3 mL via INTRAVENOUS

## 2023-01-14 MED ORDER — SODIUM CHLORIDE 0.9 % IV SOLN
INTRAVENOUS | Status: DC
Start: 2023-01-14 — End: 2023-01-15

## 2023-01-14 MED ORDER — INSULIN ASPART 100 UNIT/ML IJ SOLN
0.0000 [IU] | Freq: Three times a day (TID) | INTRAMUSCULAR | Status: DC
  Administered 2023-01-15: 1 [IU] via SUBCUTANEOUS
  Filled 2023-01-14: qty 0.09

## 2023-01-14 MED ORDER — ACETAMINOPHEN 650 MG RE SUPP: 650.0000 mg | Freq: Four times a day (QID) | RECTAL | Status: DC | PRN

## 2023-01-14 MED ORDER — SODIUM CHLORIDE 0.9 % IV BOLUS
1000.0000 mL | Freq: Once | INTRAVENOUS | Status: AC
  Administered 2023-01-14: 1000 mL via INTRAVENOUS

## 2023-01-14 MED ORDER — ALLOPURINOL 100 MG PO TABS
100.0000 mg | ORAL_TABLET | Freq: Every day | ORAL | Status: DC
Start: 2023-01-14 — End: 2023-01-15
  Administered 2023-01-14 – 2023-01-15 (×2): 100 mg via ORAL
  Filled 2023-01-14 (×3): qty 1

## 2023-01-14 MED ORDER — ACETAMINOPHEN 325 MG PO TABS: 650.0000 mg | ORAL_TABLET | Freq: Four times a day (QID) | ORAL | Status: DC | PRN

## 2023-01-14 NOTE — Progress Notes (Signed)
Hypoglycemic Event  CBG: 66  Treatment: 4 oz juice/soda  Symptoms: None  Follow-up CBG: Time: 1755  CBG Result:66  Possible Reasons for Event: Inadequate meal intake  Comments/MD notified: Notified     Orlando Penner

## 2023-01-14 NOTE — Consult Note (Signed)
Referring Provider: EDP, Dr. Estell Harpin Primary Care Physician:  Diamantina Providence, FNP Primary Gastroenterologist:  Dr. Myrtie Neither  Reason for Consultation: Hematochezia  HPI: Alice Hickman is a 72 y.o. female with medical history significant of hypertension, gout, diabetes mellitus 2, dyslipidemia, endometrial cancer with associated chemotherapy-induced pancytopenia and mucositis.  Patient has been having ongoing issues with constipation after receiving chemotherapy doses.  Has tried MiraLAX daily, but did not titrate the dosing upward.  Is now using Senokot as.  Was told last week by oncology that they could increase the, but they have not yet done so.  Has been show and had a bowel movement.  Had a bowel movement yesterday with some bright red blood.  Came to the emergency department.  Hemoglobin was stable, etc. And was discharged home.  Then after she went home yesterday she had a good bowel movement.  Slept well last night and this morning she woke up and had another bowel movement with some very small/low volume bright red blood, she showed me some photos.  Came back to the ED.  Last hemoglobin stable at 10.5 g.  Apparently she was orthostatic in the ED and since it was the second she came for this issue they decided to have her admitted for observation.  CT scan of the abdomen and pelvis with contrast yesterday showed only colonic diverticulosis.  Colonoscopy by Dr. Myrtie Neither in August 2020 with only diverticulosis noted.   Past Medical History:  Diagnosis Date   ALLERGIC RHINITIS 05/01/2007   Allergy    Anemia    ANKLE PAIN, RIGHT 09/02/2009   ANXIETY, SITUATIONAL 09/02/2009   Cancer (HCC)    CARPAL TUNNEL SYNDROME, RIGHT 11/15/2006   Depression    DM w/o Complication Type II 11/15/2006   Generalized osteoarthritis 10/27/2012   bilat ankles, knees, hands and wrists   GERD 11/15/2006   GLAUCOMA 07/04/2008   Gout 11/14/2014   HYPERLIPIDEMIA 05/01/2007   HYPERTENSION 11/15/2006    HYPOKALEMIA 10/02/2008   Low back pain    OSTEOPENIA 09/02/2009   Peripheral neuropathy 11/14/2014   THUMB PAIN, RIGHT 04/02/2009    Past Surgical History:  Procedure Laterality Date   COLONOSCOPY     CYSTOSCOPY  11/18/2022   Procedure: CYSTOSCOPY;  Surgeon: Carver Fila, MD;  Location: [facility];  Service: Gynecology;;   IR IMAGING GUIDED PORT INSERTION  12/10/2022   NO PAST SURGERIES     ROBOTIC ASSISTED TOTAL HYSTERECTOMY WITH BILATERAL SALPINGO OOPHERECTOMY N/A 11/18/2022   Procedure: XI ROBOTIC ASSISTED TOTAL HYSTERECTOMY WITH BILATERAL SALPINGO OOPHORECTOMY WITH OMENTECTOMY;  Surgeon: Carver Fila, MD;  Location: [facility];  Service: Gynecology;  Laterality: N/A;   SENTINEL NODE BIOPSY N/A 11/18/2022   Procedure: SENTINEL NODE BIOPSY;  Surgeon: Carver Fila, MD;  Location: [facility];  Service: Gynecology;  Laterality: N/A;    Prior to Admission medications   Medication Sig Start Date End Date Taking? Authorizing Provider  allopurinol (ZYLOPRIM) 100 MG tablet Take 100 mg by mouth daily.   Yes [provider]  Colchicine 0.6 MG CAPS Take 0.6 mg by mouth 2 (two) times daily as needed (gout flare pain).   Yes [provider]  dexamethasone (DECADRON) 4 MG tablet Take 2 tabs at the night before and 2 tab the morning of chemotherapy, every 3 weeks, by mouth x 6 cycles Patient taking differently: Take 8 mg by mouth See admin instructions. Take 2 tabs at the night before and 2 tab the morning of chemotherapy,  every 3 weeks, by mouth x 6 cycles 11/30/22  Yes Gorsuch, Ni, MD  glimepiride (AMARYL) 2 MG tablet TAKE ONE TABLET BY MOUTH ONCE DAILY BEFORE BREAKFAST Patient taking differently: Take 2 mg by mouth daily. 11/06/15  Yes Corwin Levins, MD  lidocaine-prilocaine (EMLA) cream Apply to affected area once 12/16/22  Yes Gorsuch, Ni, MD  olmesartan-hydrochlorothiazide (BENICAR HCT) 40-25 MG tablet TAKE ONE TABLET BY MOUTH ONCE DAILY. Patient taking differently:  Take 1 tablet by mouth daily. 11/06/15  Yes Corwin Levins, MD  ondansetron (ZOFRAN) 8 MG tablet Take 1 tablet (8 mg total) by mouth every 8 (eight) hours as needed for nausea or vomiting. Start on the third day after chemotherapy. 12/16/22  Yes Artis Delay, MD  prochlorperazine (COMPAZINE) 10 MG tablet Take 1 tablet (10 mg total) by mouth every 6 (six) hours as needed for nausea or vomiting. 12/16/22  Yes Gorsuch, Ni, MD  senna-docusate (SENOKOT-S) 8.6-50 MG tablet Take 2 tablets by mouth at bedtime. For AFTER surgery, do not take if having diarrhea 10/22/22  Yes Carver Fila, MD  traMADol (ULTRAM) 50 MG tablet Take 1 tablet (50 mg total) by mouth every 6 (six) hours as needed for severe pain (pain score 7-10). 12/21/22  Yes Artis Delay, MD  aspirin 81 MG chewable tablet Chew 81 mg by mouth daily. Patient not taking: Reported on 01/14/2023    [provider]  ferrous sulfate 325 (65 FE) MG tablet Take 325 mg by mouth daily with breakfast. Patient not taking: Reported on 01/14/2023    [provider]  latanoprost (XALATAN) 0.005 % ophthalmic solution Place 1 drop into both eyes at bedtime. Patient not taking: Reported on 01/14/2023    [provider]  magic mouthwash (nystatin, lidocaine, diphenhydrAMINE, alum & mag hydroxide) suspension Swish and spit 5 mls by mouth 4 times a day for 7 days 01/07/23   Artis Delay, MD  rosuvastatin (CRESTOR) 20 MG tablet Take 1 tablet (20 mg total) by mouth daily. 11/05/22 02/03/23  Elder Negus, MD    Current Facility-Administered Medications  Medication Dose Route Frequency Provider Last Rate Last Admin   0.9 %  sodium chloride infusion   Intravenous Continuous Russella Dar, NP       acetaminophen (TYLENOL) tablet 650 mg  650 mg Oral Q6H PRN Russella Dar, NP       Or   acetaminophen (TYLENOL) suppository 650 mg  650 mg Rectal Q6H PRN Russella Dar, NP       ondansetron [facility]) tablet 4 mg  4 mg Oral Q6H PRN Russella Dar, NP       Or   ondansetron [facility]) injection 4 mg  4 mg Intravenous Q6H PRN Russella Dar, NP       sodium chloride flush (NS) 0.9 % injection 3 mL  3 mL Intravenous Q12H Russella Dar, NP       Current Outpatient Medications  Medication Sig Dispense Refill   allopurinol (ZYLOPRIM) 100 MG tablet Take 100 mg by mouth daily.     Colchicine 0.6 MG CAPS Take 0.6 mg by mouth 2 (two) times daily as needed (gout flare pain).     dexamethasone (DECADRON) 4 MG tablet Take 2 tabs at the night before and 2 tab the morning of chemotherapy, every 3 weeks, by mouth x 6 cycles (Patient taking differently: Take 8 mg by mouth See admin instructions. Take 2 tabs at the night before and 2 tab the  morning of chemotherapy, every 3 weeks, by mouth x 6 cycles) 24 tablet 6   glimepiride (AMARYL) 2 MG tablet TAKE ONE TABLET BY MOUTH ONCE DAILY BEFORE BREAKFAST (Patient taking differently: Take 2 mg by mouth daily.) 90 tablet 0   lidocaine-prilocaine (EMLA) cream Apply to affected area once 30 g 3   olmesartan-hydrochlorothiazide (BENICAR HCT) 40-25 MG tablet TAKE ONE TABLET BY MOUTH ONCE DAILY. (Patient taking differently: Take 1 tablet by mouth daily.) 30 tablet 5   ondansetron (ZOFRAN) 8 MG tablet Take 1 tablet (8 mg total) by mouth every 8 (eight) hours as needed for nausea or vomiting. Start on the third day after chemotherapy. 30 tablet 1   prochlorperazine (COMPAZINE) 10 MG tablet Take 1 tablet (10 mg total) by mouth every 6 (six) hours as needed for nausea or vomiting. 30 tablet 1   senna-docusate (SENOKOT-S) 8.6-50 MG tablet Take 2 tablets by mouth at bedtime. For AFTER surgery, do not take if having diarrhea 30 tablet 0   traMADol (ULTRAM) 50 MG tablet Take 1 tablet (50 mg total) by mouth every 6 (six) hours as needed for severe pain (pain score 7-10). 60 tablet 0   aspirin 81 MG chewable tablet Chew 81 mg by mouth daily. (Patient not taking: Reported on 01/14/2023)     ferrous sulfate 325 (65 FE)  MG tablet Take 325 mg by mouth daily with breakfast. (Patient not taking: Reported on 01/14/2023)     latanoprost (XALATAN) 0.005 % ophthalmic solution Place 1 drop into both eyes at bedtime. (Patient not taking: Reported on 01/14/2023)     magic mouthwash (nystatin, lidocaine, diphenhydrAMINE, alum & mag hydroxide) suspension Swish and spit 5 mls by mouth 4 times a day for 7 days 160 mL 0   rosuvastatin (CRESTOR) 20 MG tablet Take 1 tablet (20 mg total) by mouth daily. 90 tablet 3    Allergies as of 01/14/2023 - Review Complete 01/14/2023  Allergen Reaction Noted   Penicillins Other (See Comments) 05/01/2007   Lovastatin Other (See Comments) 04/02/2009   Metformin Nausea And Vomiting 06/12/2007    Family History  Problem Relation Age of Onset   Heart disease Mother    Hypertension Mother    Cancer Brother        throat cancer, smoker   Cancer Brother    Breast cancer Granddaughter    Cancer Niece    Breast cancer Niece    Heart disease Other    Hypertension Other    Colon cancer Neg Hx    Colon polyps Neg Hx    Esophageal cancer Neg Hx    Stomach cancer Neg Hx    Rectal cancer Neg Hx     Social History   Socioeconomic History   Marital status: Single    Spouse name: Not on file   Number of children: 3   Years of education: Not on file   Highest education level: Not on file  Occupational History   Occupation: FLOATER    Employer: [employer]   Occupation: Education officer, environmental, as well as Statistician   Occupation: retired  Tobacco Use   Smoking status: Never   Smokeless tobacco: Never  Vaping Use   Vaping status: Never Used  Substance and Sexual Activity   Alcohol use: No   Drug use: No   Sexual activity: Not on file  Other Topics Concern   Not on file  Social History Narrative   Not on file   Social Drivers of Health  Financial Resource Strain: Not on file  Food Insecurity: No Food Insecurity (12/06/2022)   Hunger Vital Sign    Worried About Running Out  of Food in the Last Year: Never true    Ran Out of Food in the Last Year: Never true  Transportation Needs: No Transportation Needs (12/06/2022)   PRAPARE - Administrator, Civil Service (Medical): No    Lack of Transportation (Non-Medical): No  Physical Activity: Not on file  Stress: Not on file  Social Connections: Not on file  Intimate Partner Violence: Not At Risk (12/06/2022)   Humiliation, Afraid, Rape, and Kick questionnaire    Fear of Current or Ex-Partner: No    Emotionally Abused: No    Physically Abused: No    Sexually Abused: No    Review of Systems: ROS is O/W negative except as mentioned in HPI.  Physical Exam: Vital signs in last 24 hours: Temp:  [98.1 F (36.7 C)-98.4 F (36.9 C)] 98.4 F (36.9 C) (12/13 1346) Pulse Rate:  [70-108] 80 (12/13 1115) Resp:  [14-19] 17 (12/13 1115) BP: (126-176)/(50-91) 126/76 (12/13 1115) SpO2:  [100 %] 100 % (12/13 1115) Weight:  [76.7 kg] 76.7 kg (12/13 0859)   General:  Alert, Well-developed, well-nourished, pleasant and cooperative in NAD Head:  Normocephalic and atraumatic. Eyes:  Sclera clear, no icterus.  Conjunctiva pink. Ears:  Normal auditory acuity. Mouth:  No deformity or lesions.   Lungs:  Clear throughout to auscultation.  No wheezes, crackles, or rhonchi.  Heart:  Regular rate and rhythm; no murmurs, clicks, rubs, or gallops. Abdomen:  Soft, non-distended.  BS present.  Non-tender.  Rectal:  One small external hemorrhoid noted.  DRE did not reveal any masses or significant stool in the rectum.  Trace amount of light brown stool on exam glove, no blood noted. Msk:  Symmetrical without gross deformities. Pulses:  Normal pulses noted. Extremities:  Without clubbing or edema. Neurologic:  Alert and oriented x 4;  grossly normal neurologically. Skin:  Intact without significant lesions or rashes. Psych:  Alert and cooperative. Normal mood and affect.  Lab Results: Recent Labs    01/13/23 1200  01/14/23 1150 01/14/23 1230  WBC 1.9* 1.8*  --   HGB 10.0* 9.8* 10.5*  HCT 31.6* 31.1* 31.0*  PLT 172 160  --    BMET Recent Labs    01/13/23 1200 01/14/23 1150 01/14/23 1230  NA 136 135 136  K 3.6 3.4* 5.2*  CL 102 103 108  CO2 25 25  --   GLUCOSE 172* 90 82  BUN 23 16 18   CREATININE 1.21* 1.09* 1.10*  CALCIUM 9.1 9.2  --    LFT Recent Labs    01/13/23 1200  PROT 6.6  ALBUMIN 3.5  AST 33  ALT 31  ALKPHOS 43  BILITOT 0.7   Studies/Results: CT ABDOMEN PELVIS W CONTRAST Result Date: 01/13/2023 CLINICAL DATA:  Acute abdominal pain. Constipation. High-grade endometrial carcinoma. * Tracking Code: BO * EXAM: CT ABDOMEN AND PELVIS WITH CONTRAST TECHNIQUE: Multidetector CT imaging of the abdomen and pelvis was performed using the standard protocol following bolus administration of intravenous contrast. RADIATION DOSE REDUCTION: This exam was performed according to the departmental dose-optimization program which includes automated exposure control, adjustment of the mA and/or kV according to patient size and/or use of iterative reconstruction technique. CONTRAST:  OMNIPAQUE IOHEXOL 300 MG/ML  SOLN COMPARISON:  10/28/2022 FINDINGS: Lower Chest: No acute findings. Hepatobiliary: No suspicious hepatic masses identified. Gallbladder is unremarkable.  No evidence of biliary ductal dilatation. Pancreas:  No mass or inflammatory changes. Spleen: Within normal limits in size and appearance. Adrenals/Urinary Tract: No suspicious masses identified. No evidence of ureteral calculi or hydronephrosis. Stomach/Bowel: No evidence of obstruction, inflammatory process or abnormal fluid collections. Normal appendix visualized. Diverticulosis is seen mainly involving the descending and sigmoid colon, however there is no evidence of diverticulitis. Vascular/Lymphatic: No pathologically enlarged lymph nodes. No acute vascular findings. Reproductive: Prior hysterectomy noted. Vaginal cuff and adnexal  regions are unremarkable in appearance. No No evidence of mass or ascites. Other:  None. Musculoskeletal:  No suspicious bone lesions identified. IMPRESSION: No acute findings. Colonic diverticulosis, without radiographic evidence of diverticulitis. Prior hysterectomy. No evidence of recurrent or metastatic carcinoma. Electronically Signed   By: Danae Orleans M.D.   On: 01/13/2023 15:11    IMPRESSION:  *Hematochezia in the setting of constipation:  Very low volume from description and photos.  Hemoglobin stable at 10.5 g.  Suspect this is hemorrhoidal in etiology.   *Hyperkalemia potassium 5.2. *Endometrial cancer: Currently undergoing treatment.  Apparently her cancer treatment is causing her constipation as well as some mucositis.  PLAN: -Will likely treat symptomatically for now.  Need to treat underlying constipation.  Had a good BM yesterday and then another this morning.  May need Miralax a couple of times a day, increase in her Senokot-S, snack on prunes and "P" fruits.  Could try low dose Linzess 72 mcg daily. -Will add hydrocortisone suppositories BID for a few days and then prn.   Princella Pellegrini. Kiyon Fidalgo  01/14/2023, 1:56 PM

## 2023-01-14 NOTE — H&P (Signed)
History and Physical    Patient: Alice Hickman QMV:[id number] DOB: May 28, 1950 DOA: 01/14/2023 DOS: the patient was seen and examined on 01/14/2023 PCP: Diamantina Providence, FNP  Patient coming from: Home Medical readiness/disposition: Anticipate patient will be ready for discharge at least by 01/15/2023.  She will discharge back home with her daughter assisting with care  Chief Complaint:  Chief Complaint  Patient presents with   Constipation   Abdominal Pain   HPI: Alice Hickman is a 72 y.o. female with medical history significant of hypertension, gout, diabetes mellitus 2, dyslipidemia, endometrial cancer with associated chemotherapy-induced pancytopenia and mucositis.  Patient has been having ongoing issues with constipation after receiving chemotherapy doses she does get dexamethasone with each chemotherapy dose.  She recently initiated a trial of stool softeners on date of chemotherapy without success.  On 12/12 patient was very constipated.  She took MiraLAX without any improvement.  She took a small dose of calcium citrate which caused abdominal cramping.  Her daughter had put gloves on and felt that there was a large bowel movement sitting stuck in the rectum.  The patient began straining and had a near syncopal event.  She eventually passed 1 bowel movement that was normal and a second bowel movement was reddish.  That night the patient was not able to eat and she redeveloped abdominal cramps but was unable to pass a bowel movement.  Eventually she bear down and brown stool came out that was runny.  She has not been having any rectal pain.  With wiping it was noted there was blood on the tissue and some in the toilet.  With a second attempt at a bowel movement today patient had a syncopal event and because of the symptoms daughter called EMS to the house.  In the ED the patient was afebrile and while supine she was actually hypertensive.  Subsequent orthostatic vital signs were positive  with BP dropping as low as 100/57 and pulse increasing to as high as 129.  Because of her ongoing constipation as well as mucositis patient has had poor oral intake especially after chemotherapy rounds.  She has not had any bright red blood per rectum or clots.  She has not had any vomiting although she does have some nausea.  Patient's creatinine is at baseline with a GFR between 52 and 55.  Because of her orthostasis she has been given 1 L saline bolus by the EDP.  CT abdomen and pelvis revealed colonic diverticulitis without evidence of diverticulitis.  Because of the bleeding GI was consulted by the EDP.  Please note that the patient had visited the ED on 12/12 and hemoglobin at that time was 10 with recent hemoglobin 10.5.  Fecal occult blood was positive.  Hospital service has been consulted to evaluate the patient for admission  Review of Systems: As mentioned in the history of present illness. All other systems reviewed and are negative.  Past Medical History:  Diagnosis Date   ALLERGIC RHINITIS 05/01/2007   Allergy    Anemia    ANKLE PAIN, RIGHT 09/02/2009   ANXIETY, SITUATIONAL 09/02/2009   Cancer (HCC)    CARPAL TUNNEL SYNDROME, RIGHT 11/15/2006   Depression    DM w/o Complication Type II 11/15/2006   Generalized osteoarthritis 10/27/2012   bilat ankles, knees, hands and wrists   GERD 11/15/2006   GLAUCOMA 07/04/2008   Gout 11/14/2014   HYPERLIPIDEMIA 05/01/2007   HYPERTENSION 11/15/2006   HYPOKALEMIA 10/02/2008   Low back pain  OSTEOPENIA 09/02/2009   Peripheral neuropathy 11/14/2014   THUMB PAIN, RIGHT 04/02/2009   Past Surgical History:  Procedure Laterality Date   COLONOSCOPY     CYSTOSCOPY  11/18/2022   Procedure: CYSTOSCOPY;  Surgeon: Carver Fila, MD;  Location: [facility];  Service: Gynecology;;   IR IMAGING GUIDED PORT INSERTION  12/10/2022   NO PAST SURGERIES     ROBOTIC ASSISTED TOTAL HYSTERECTOMY WITH BILATERAL SALPINGO OOPHERECTOMY N/A 11/18/2022    Procedure: XI ROBOTIC ASSISTED TOTAL HYSTERECTOMY WITH BILATERAL SALPINGO OOPHORECTOMY WITH OMENTECTOMY;  Surgeon: Carver Fila, MD;  Location: [facility];  Service: Gynecology;  Laterality: N/A;   SENTINEL NODE BIOPSY N/A 11/18/2022   Procedure: SENTINEL NODE BIOPSY;  Surgeon: Carver Fila, MD;  Location: [facility];  Service: Gynecology;  Laterality: N/A;   Social History:  reports that she has never smoked. She has never used smokeless tobacco. She reports that she does not drink alcohol and does not use drugs.  Allergies  Allergen Reactions   Penicillins Other (See Comments)    Rash over whole body, difficulty breathing throat swelling   Lovastatin Other (See Comments)    REACTION: leg cramps   Metformin Nausea And Vomiting    REACTION: GI upset    Family History  Problem Relation Age of Onset   Heart disease Mother    Hypertension Mother    Cancer Brother        throat cancer, smoker   Cancer Brother    Breast cancer Granddaughter    Cancer Niece    Breast cancer Niece    Heart disease Other    Hypertension Other    Colon cancer Neg Hx    Colon polyps Neg Hx    Esophageal cancer Neg Hx    Stomach cancer Neg Hx    Rectal cancer Neg Hx     Prior to Admission medications   Medication Sig Start Date End Date Taking? Authorizing Provider  allopurinol (ZYLOPRIM) 100 MG tablet Take 100 mg by mouth daily.   Yes [provider]  Colchicine 0.6 MG CAPS Take 0.6 mg by mouth 2 (two) times daily as needed (gout flare pain).   Yes [provider]  dexamethasone (DECADRON) 4 MG tablet Take 2 tabs at the night before and 2 tab the morning of chemotherapy, every 3 weeks, by mouth x 6 cycles Patient taking differently: Take 8 mg by mouth See admin instructions. Take 2 tabs at the night before and 2 tab the morning of chemotherapy, every 3 weeks, by mouth x 6 cycles 11/30/22  Yes Gorsuch, Ni, MD  glimepiride (AMARYL) 2 MG tablet TAKE ONE TABLET BY MOUTH ONCE DAILY  BEFORE BREAKFAST Patient taking differently: Take 2 mg by mouth daily. 11/06/15  Yes Corwin Levins, MD  lidocaine-prilocaine (EMLA) cream Apply to affected area once 12/16/22  Yes Gorsuch, Ni, MD  olmesartan-hydrochlorothiazide (BENICAR HCT) 40-25 MG tablet TAKE ONE TABLET BY MOUTH ONCE DAILY. Patient taking differently: Take 1 tablet by mouth daily. 11/06/15  Yes Corwin Levins, MD  ondansetron (ZOFRAN) 8 MG tablet Take 1 tablet (8 mg total) by mouth every 8 (eight) hours as needed for nausea or vomiting. Start on the third day after chemotherapy. 12/16/22  Yes Artis Delay, MD  prochlorperazine (COMPAZINE) 10 MG tablet Take 1 tablet (10 mg total) by mouth every 6 (six) hours as needed for nausea or vomiting. 12/16/22  Yes Gorsuch, Ni, MD  senna-docusate (SENOKOT-S) 8.6-50 MG tablet Take 2 tablets by mouth  at bedtime. For AFTER surgery, do not take if having diarrhea 10/22/22  Yes Carver Fila, MD  traMADol (ULTRAM) 50 MG tablet Take 1 tablet (50 mg total) by mouth every 6 (six) hours as needed for severe pain (pain score 7-10). 12/21/22  Yes Artis Delay, MD  aspirin 81 MG chewable tablet Chew 81 mg by mouth daily. Patient not taking: Reported on 01/14/2023    [provider]  ferrous sulfate 325 (65 FE) MG tablet Take 325 mg by mouth daily with breakfast. Patient not taking: Reported on 01/14/2023    [provider]  latanoprost (XALATAN) 0.005 % ophthalmic solution Place 1 drop into both eyes at bedtime. Patient not taking: Reported on 01/14/2023    [provider]  magic mouthwash (nystatin, lidocaine, diphenhydrAMINE, alum & mag hydroxide) suspension Swish and spit 5 mls by mouth 4 times a day for 7 days 01/07/23   Artis Delay, MD  rosuvastatin (CRESTOR) 20 MG tablet Take 1 tablet (20 mg total) by mouth daily. 11/05/22 02/03/23  Elder Negus, MD    Physical Exam: Vitals:   01/14/23 0859 01/14/23 1115 01/14/23 1346 01/14/23 1430  BP: (!) 164/91 126/76  (!)  168/87  Pulse: (!) 108 80  82  Resp: 18 17  18   Temp: 98.2 F (36.8 C)  98.4 F (36.9 C)   TempSrc: Oral  Oral   SpO2: 100% 100%  100%  Weight: 76.7 kg     Height: 5\' 3"  (1.6 m)      Constitutional: NAD, calm, comfortable Respiratory: clear to auscultation bilaterally, no wheezing, no crackles. Normal respiratory effort. No accessory muscle use. RA Cardiovascular: Regular rate and rhythm, no murmurs / rubs / gallops. No extremity edema. 2+ pedal pulses.  Abdomen: no tenderness, no masses palpated. No hepatosplenomegaly. Bowel sounds positive.  Musculoskeletal: no clubbing / cyanosis. No joint deformity upper and lower extremities. Good ROM, no contractures. Normal muscle tone.  Skin: no rashes, lesions, ulcers. No induration Neurologic: CN 2-12 grossly intact. Sensation intact,  Strength 5/5 x all 4 extremities.  Psychiatric: Normal judgment and insight. Alert and oriented x 3. Normal mood.    Data Reviewed:  Initial sodium 135 with potassium 3.4.  Follow-up electrolyte panel 1 hour later demonstrated a potassium of 5.2 although this could be a hemolyzed specimen, BUN 16, creatinine 1.09  WBC 1.8, hemoglobin 9.8 with follow-up hemoglobin 10.5, differential unremarkable except for neutrophil 0.6, platelets normal at 160,000  CT abdomen and pelvis as above  Assessment and Plan: Rectal bleeding Appears mild at this juncture and more related to trauma from difficult passage of constipated stool consistent with stercoral colitis or irritated hemorrhoids Appreciate GI consultation Hold pharmacological DVT prophylaxis Monitor closely in the progressive care unit Thus far hemoglobin has remained stable and unless begins bleeding can repeat hemoglobin with morning labs at 5 AM  Syncope Patient was found to have positive orthostatic vital signs and suspect volume depletion secondary to poor oral intake as well as thiazide diuretic contributing Patient has been given 1 L of normal saline  IV fluid Will continue normal saline at 75 cc/h for now Check orthostatic vital signs every shift Holding antihypertensive medication for now  Severe constipation Patient and daughter reports worsens after each chemotherapy treatment I have initiated Colace 100 mg twice daily along with MiraLAX at bedtime Patient has a difficult time getting in enough fluids Patient also has thiazide diuretic added to her antihypertensive medication and this should probably be discontinued given her  poor oral intake  Endometrial cancer with chemotherapy related pancytopenia Appreciate oncology input Recent chemotherapy complicated by mucositis, pancytopenia constipation Guards to the pancytopenia oncology documents will not need G-CSF support unless she develops neutropenic fever  Diabetes mellitus 2 Given poor oral intake we will hold off on home glimepiride for now Follow CBGs and provide SSI  Hypertension On olmesartan/HCT combo at home Recommend discontinuing hydrochlorothiazide Supine blood pressures are elevated but patient developed orthostatic hypotension with syncope therefore holding antihypertensive medications for now    Advance Care Planning:   Code Status: Full Code confirmed with patient and daughter at bedside  Consults: Gastroenterology, oncology  Family Communication: Daughter at bedside  Severity of Illness: The appropriate patient status for this patient is OBSERVATION. Observation status is judged to be reasonable and necessary in order to provide the required intensity of service to ensure the patient's safety. The patient's presenting symptoms, physical exam findings, and initial radiographic and laboratory data in the context of their medical condition is felt to place them at decreased risk for further clinical deterioration. Furthermore, it is anticipated that the patient will be medically stable for discharge from the hospital within 2 midnights of admission.    Author: Junious Silk, NP 01/14/2023 3:41 PM  For on call review [email or web address].

## 2023-01-14 NOTE — Progress Notes (Signed)
Alice Hickman   DOB:[birth year]   ZO#:[id number]    ASSESSMENT & PLAN:  Endometrial cancer Her recent chemotherapy was complicated by mucositis, pancytopenia and constipation.  Continue supportive care  Acquired pancytopenia She does not need G-CSF support unless she had neutropenic fever.  Continue to monitor closely.  No need transfusion support.  Severe constipation Continue gentle laxatives  Discharge planning Will defer to primary service I am off until Tuesday next week, please call consult service if questions arise.  She has appointment to come back for chemotherapy in January  All questions were answered. The patient knows to call the clinic with any problems, questions or concerns.   The total time spent in the appointment was 40 minutes encounter with patients including review of chart and various tests results, discussions about plan of care and coordination of care plan  Artis Delay, MD 01/14/2023 2:13 PM  Subjective:  Patient is well-known to me.  I just saw her recently a week ago for chemotherapy.  The patient is known to have chronic constipation.  She is not a reliable historian.  She had severe constipation with straining leading to syncopal episode.  Her daughter noted passage of bright red blood per rectum and due to severe pancytopenia, she is being admitted for observation.  She denies further bleeding since then.  Denies mucositis.  Objective:  Vitals:   01/14/23 1115 01/14/23 1346  BP: 126/76   Pulse: 80   Resp: 17   Temp:  98.4 F (36.9 C)  SpO2: 100%     No intake or output data in the 24 hours ending 01/14/23 1413  GENERAL:alert, no distress and comfortable  NEURO: alert & oriented x 3 with fluent speech, no focal motor/sensory deficits   Labs:  Recent Labs    12/14/22 1323 01/07/23 0841 01/13/23 1200 01/14/23 1150 01/14/23 1230  NA 143 139 136 135 136  K 3.4* 4.1 3.6 3.4* 5.2*  CL 107 105 102 103 108  CO2 30 27 25 25   --   GLUCOSE 105*  191* 172* 90 82  BUN 22 24* 23 16 18   CREATININE 1.15* 1.24* 1.21* 1.09* 1.10*  CALCIUM 9.9 9.9 9.1 9.2  --   GFRNONAA 51* 47* 48* 55*  --   PROT 7.1 6.8 6.6  --   --   ALBUMIN 3.9 3.9 3.5  --   --   AST 14* 15 33  --   --   ALT 11 16 31   --   --   ALKPHOS 54 49 43  --   --   BILITOT 0.4 0.3 0.7  --   --     Studies: I personally reviewed her CT imaging CT ABDOMEN PELVIS W CONTRAST Result Date: 01/13/2023 CLINICAL DATA:  Acute abdominal pain. Constipation. High-grade endometrial carcinoma. * Tracking Code: BO * EXAM: CT ABDOMEN AND PELVIS WITH CONTRAST TECHNIQUE: Multidetector CT imaging of the abdomen and pelvis was performed using the standard protocol following bolus administration of intravenous contrast. RADIATION DOSE REDUCTION: This exam was performed according to the departmental dose-optimization program which includes automated exposure control, adjustment of the mA and/or kV according to patient size and/or use of iterative reconstruction technique. CONTRAST:  OMNIPAQUE IOHEXOL 300 MG/ML  SOLN COMPARISON:  10/28/2022 FINDINGS: Lower Chest: No acute findings. Hepatobiliary: No suspicious hepatic masses identified. Gallbladder is unremarkable. No evidence of biliary ductal dilatation. Pancreas:  No mass or inflammatory changes. Spleen: Within normal limits in size and appearance. Adrenals/Urinary Tract: No  suspicious masses identified. No evidence of ureteral calculi or hydronephrosis. Stomach/Bowel: No evidence of obstruction, inflammatory process or abnormal fluid collections. Normal appendix visualized. Diverticulosis is seen mainly involving the descending and sigmoid colon, however there is no evidence of diverticulitis. Vascular/Lymphatic: No pathologically enlarged lymph nodes. No acute vascular findings. Reproductive: Prior hysterectomy noted. Vaginal cuff and adnexal regions are unremarkable in appearance. No No evidence of mass or ascites. Other:  None. Musculoskeletal:  No  suspicious bone lesions identified. IMPRESSION: No acute findings. Colonic diverticulosis, without radiographic evidence of diverticulitis. Prior hysterectomy. No evidence of recurrent or metastatic carcinoma. Electronically Signed   By: Danae Orleans M.D.   On: 01/13/2023 15:11

## 2023-01-14 NOTE — ED Triage Notes (Signed)
Pt arrived reporting constipation x2 days with an episode of blood in tool per caregiver. Was seen yesterday and d/c for the same. State had another episode this morning with blood in stool. Has been giving stool softener at home. Unable to have a normal BM Pt currently doing chemo treatments.

## 2023-01-14 NOTE — Progress Notes (Signed)
Hypoglycemic Event  CBG: 66  Treatment: 8 oz juice/soda and eat dinner.   Symptoms: None  Follow-up CBG: Time:1824 CBG Result:110  Possible Reasons for Event: Inadequate meal intake  Comments/MD notified:Yes    Khiley Lieser, Jose Persia

## 2023-01-14 NOTE — ED Notes (Signed)
ED TO INPATIENT HANDOFF REPORT  ED Nurse Name and Phone #:  Mellody Dance  -  [phone]  S Name/Age/Gender Alice Hickman 72 y.o. female Room/Bed: WA12/WA12  Code Status   Code Status: Prior  Home/SNF/Other Home Patient oriented to: self, place, time, and situation Is this baseline? Yes   Triage Complete: Triage complete  Chief Complaint Rectal bleeding [K62.5]  Triage Note Pt arrived reporting constipation x2 days with an episode of blood in tool per caregiver. Was seen yesterday and d/c for the same. State had another episode this morning with blood in stool. Has been giving stool softener at home. Unable to have a normal BM Pt currently doing chemo treatments.    Allergies Allergies  Allergen Reactions   Penicillins Other (See Comments)    Rash over whole body, difficulty breathing throat swelling   Lovastatin Other (See Comments)    REACTION: leg cramps   Metformin Nausea And Vomiting    REACTION: GI upset    Level of Care/Admitting Diagnosis ED Disposition     ED Disposition  Admit   Condition  --   Comment  Hospital Area: [facility] [[id number]]  Level of Care: Progressive [102]  Admit to Progressive based on following criteria: GI, ENDOCRINE disease patients with GI bleeding, acute liver failure or pancreatitis, stable with diabetic ketoacidosis or thyrotoxicosis (hypothyroid) state.  May place patient in observation at [facility] or [facility] if equivalent level of care is available:: No  Covid Evaluation: Asymptomatic - no recent exposure (last 10 days) testing not required  Diagnosis: Rectal bleeding [217577]  Admitting Physician: Marguerita Merles LATIF [[id number]]  Attending Physician: Marguerita Merles LATIF [[id number]]          B Medical/Surgery History Past Medical History:  Diagnosis Date   ALLERGIC RHINITIS 05/01/2007   Allergy    Anemia    ANKLE PAIN, RIGHT 09/02/2009   ANXIETY, SITUATIONAL 09/02/2009   Cancer (HCC)    CARPAL TUNNEL  SYNDROME, RIGHT 11/15/2006   Depression    DM w/o Complication Type II 11/15/2006   Generalized osteoarthritis 10/27/2012   bilat ankles, knees, hands and wrists   GERD 11/15/2006   GLAUCOMA 07/04/2008   Gout 11/14/2014   HYPERLIPIDEMIA 05/01/2007   HYPERTENSION 11/15/2006   HYPOKALEMIA 10/02/2008   Low back pain    OSTEOPENIA 09/02/2009   Peripheral neuropathy 11/14/2014   THUMB PAIN, RIGHT 04/02/2009   Past Surgical History:  Procedure Laterality Date   COLONOSCOPY     CYSTOSCOPY  11/18/2022   Procedure: CYSTOSCOPY;  Surgeon: Carver Fila, MD;  Location: [facility];  Service: Gynecology;;   IR IMAGING GUIDED PORT INSERTION  12/10/2022   NO PAST SURGERIES     ROBOTIC ASSISTED TOTAL HYSTERECTOMY WITH BILATERAL SALPINGO OOPHERECTOMY N/A 11/18/2022   Procedure: XI ROBOTIC ASSISTED TOTAL HYSTERECTOMY WITH BILATERAL SALPINGO OOPHORECTOMY WITH OMENTECTOMY;  Surgeon: Carver Fila, MD;  Location: [facility];  Service: Gynecology;  Laterality: N/A;   SENTINEL NODE BIOPSY N/A 11/18/2022   Procedure: SENTINEL NODE BIOPSY;  Surgeon: Carver Fila, MD;  Location: [facility];  Service: Gynecology;  Laterality: N/A;     A IV Location/Drains/Wounds Patient Lines/Drains/Airways Status     Active Line/Drains/Airways     Name Placement date Placement time Site Days   Implanted Port 12/10/22 Right Chest 12/10/22  1414  Chest  35   Peripheral IV 01/14/23 20 G 2.5" Left Antecubital 01/14/23  1153  Antecubital  less than 1   Incision - 5  Ports Abdomen Umbilicus Left;Upper Left;Mid Right;Upper Right;Mid 11/18/22  1245  -- 57            Intake/Output Last 24 hours No intake or output data in the 24 hours ending 01/14/23 1351  Labs/Imaging Results for orders placed or performed during the hospital encounter of 01/14/23 (from the past 48 hours)  POC occult blood, ED     Status: Abnormal   Collection Time: 01/14/23 10:12 AM  Result Value Ref Range   Fecal Occult Bld POSITIVE (A)  NEGATIVE  CBC with Differential     Status: Abnormal   Collection Time: 01/14/23 11:50 AM  Result Value Ref Range   WBC 1.8 (L) 4.0 - 10.5 K/uL   RBC 3.57 (L) 3.87 - 5.11 MIL/uL   Hemoglobin 9.8 (L) 12.0 - 15.0 g/dL   HCT 16.1 (L) 09.6 - 04.5 %   MCV 87.1 80.0 - 100.0 fL   MCH 27.5 26.0 - 34.0 pg   MCHC 31.5 30.0 - 36.0 g/dL   RDW 40.9 81.1 - 91.4 %   Platelets 160 150 - 400 K/uL   nRBC 0.0 0.0 - 0.2 %   Neutrophils Relative % 31 %   Neutro Abs 0.6 (L) 1.7 - 7.7 K/uL   Lymphocytes Relative 61 %   Lymphs Abs 1.1 0.7 - 4.0 K/uL   Monocytes Relative 4 %   Monocytes Absolute 0.1 0.1 - 1.0 K/uL   Eosinophils Relative 2 %   Eosinophils Absolute 0.0 0.0 - 0.5 K/uL   Basophils Relative 1 %   Basophils Absolute 0.0 0.0 - 0.1 K/uL   Immature Granulocytes 1 %   Abs Immature Granulocytes 0.01 0.00 - 0.07 K/uL   Reactive, Benign Lymphocytes PRESENT    Ovalocytes PRESENT    Platelet Morphology NORMAL     Comment: Performed at [facility], [address], [place], [place] [address]  Basic metabolic panel     Status: Abnormal   Collection Time: 01/14/23 11:50 AM  Result Value Ref Range   Sodium 135 135 - 145 mmol/L   Potassium 3.4 (L) 3.5 - 5.1 mmol/L   Chloride 103 98 - 111 mmol/L   CO2 25 22 - 32 mmol/L   Glucose, Bld 90 70 - 99 mg/dL    Comment: Glucose reference range applies only to samples taken after fasting for at least 8 hours.   BUN 16 8 - 23 mg/dL   Creatinine, Ser 6.21 (H) 0.44 - 1.00 mg/dL   Calcium 9.2 8.9 - 30.8 mg/dL   GFR, Estimated 55 (L) >60 mL/min    Comment: (NOTE) Calculated using the CKD-EPI Creatinine Equation (2021)    Anion gap 7 5 - 15    Comment: Performed at [facility], [address], [place], [place] [address]  I-stat chem 8, ED (not at [facility], [facility] or [facility])     Status: Abnormal   Collection Time: 01/14/23 12:30 PM  Result Value Ref Range   Sodium 136 135 - 145 mmol/L   Potassium 5.2 (H) 3.5 - 5.1 mmol/L    Chloride 108 98 - 111 mmol/L   BUN 18 8 - 23 mg/dL   Creatinine, Ser 6.96 (H) 0.44 - 1.00 mg/dL   Glucose, Bld 82 70 - 99 mg/dL    Comment: Glucose reference range applies only to samples taken after fasting for at least 8 hours.   Calcium, Ion 0.91 (L) 1.15 - 1.40 mmol/L   TCO2 26 22 - 32 mmol/L  Hemoglobin 10.5 (L) 12.0 - 15.0 g/dL   HCT 16.1 (L) 09.6 - 04.5 %   CT ABDOMEN PELVIS W CONTRAST Result Date: 01/13/2023 CLINICAL DATA:  Acute abdominal pain. Constipation. High-grade endometrial carcinoma. * Tracking Code: BO * EXAM: CT ABDOMEN AND PELVIS WITH CONTRAST TECHNIQUE: Multidetector CT imaging of the abdomen and pelvis was performed using the standard protocol following bolus administration of intravenous contrast. RADIATION DOSE REDUCTION: This exam was performed according to the departmental dose-optimization program which includes automated exposure control, adjustment of the mA and/or kV according to patient size and/or use of iterative reconstruction technique. CONTRAST:  OMNIPAQUE IOHEXOL 300 MG/ML  SOLN COMPARISON:  10/28/2022 FINDINGS: Lower Chest: No acute findings. Hepatobiliary: No suspicious hepatic masses identified. Gallbladder is unremarkable. No evidence of biliary ductal dilatation. Pancreas:  No mass or inflammatory changes. Spleen: Within normal limits in size and appearance. Adrenals/Urinary Tract: No suspicious masses identified. No evidence of ureteral calculi or hydronephrosis. Stomach/Bowel: No evidence of obstruction, inflammatory process or abnormal fluid collections. Normal appendix visualized. Diverticulosis is seen mainly involving the descending and sigmoid colon, however there is no evidence of diverticulitis. Vascular/Lymphatic: No pathologically enlarged lymph nodes. No acute vascular findings. Reproductive: Prior hysterectomy noted. Vaginal cuff and adnexal regions are unremarkable in appearance. No No evidence of mass or ascites. Other:  None.  Musculoskeletal:  No suspicious bone lesions identified. IMPRESSION: No acute findings. Colonic diverticulosis, without radiographic evidence of diverticulitis. Prior hysterectomy. No evidence of recurrent or metastatic carcinoma. Electronically Signed   By: Danae Orleans M.D.   On: 01/13/2023 15:11    Pending Labs Unresulted Labs (From admission, onward)     Start     Ordered   01/14/23 1500  Basic metabolic panel  ONCE - URGENT,   URGENT        01/14/23 1247   01/14/23 1500  Hemoglobin and hematocrit, blood  ONCE - URGENT,   URGENT        01/14/23 1247   01/14/23 1247  Protime-INR  Once,   R        01/14/23 1246   01/14/23 1247  APTT  Once,   R        01/14/23 1246   01/14/23 1246  Type and screen [facility]  Once,   R       Comments: [facility]    01/14/23 1245            Vitals/Pain Today's Vitals   01/14/23 0859 01/14/23 0911 01/14/23 1115 01/14/23 1346  BP: (!) 164/91  126/76   Pulse: (!) 108  80   Resp: 18  17   Temp: 98.2 F (36.8 C)   98.4 F (36.9 C)  TempSrc: Oral   Oral  SpO2: 100%  100%   Weight: 76.7 kg     Height: 5\' 3"  (1.6 m)     PainSc:  3       Isolation Precautions No active isolations  Medications Medications  0.9 %  sodium chloride infusion (has no administration in time range)  sodium chloride 0.9 % bolus 1,000 mL (1,000 mLs Intravenous New Bag/Given 01/14/23 1220)    Mobility walks     Focused Assessments    R Recommendations: See Admitting Provider Note  Report given to:   Additional Notes:

## 2023-01-14 NOTE — ED Notes (Signed)
Vitals validated accidentally.

## 2023-01-15 ENCOUNTER — Encounter (HOSPITAL_COMMUNITY): Payer: Self-pay | Admitting: Internal Medicine

## 2023-01-15 DIAGNOSIS — R55 Syncope and collapse: Secondary | ICD-10-CM | POA: Diagnosis not present

## 2023-01-15 DIAGNOSIS — K5900 Constipation, unspecified: Secondary | ICD-10-CM

## 2023-01-15 DIAGNOSIS — K625 Hemorrhage of anus and rectum: Secondary | ICD-10-CM | POA: Diagnosis not present

## 2023-01-15 LAB — CBC WITH DIFFERENTIAL/PLATELET
Abs Immature Granulocytes: 0.01 10*3/uL (ref 0.00–0.07)
Abs Immature Granulocytes: 0 10*3/uL (ref 0.00–0.07)
Basophils Absolute: 0 10*3/uL (ref 0.0–0.1)
Basophils Absolute: 0 10*3/uL (ref 0.0–0.1)
Basophils Relative: 1 %
Basophils Relative: 1 %
Eosinophils Absolute: 0 10*3/uL (ref 0.0–0.5)
Eosinophils Absolute: 0 10*3/uL (ref 0.0–0.5)
Eosinophils Relative: 2 %
Eosinophils Relative: 1 %
HCT: 26.5 % — ABNORMAL LOW (ref 36.0–46.0)
HCT: 27.3 % — ABNORMAL LOW (ref 36.0–46.0)
Hemoglobin: 8.2 g/dL — ABNORMAL LOW (ref 12.0–15.0)
Hemoglobin: 8.9 g/dL — ABNORMAL LOW (ref 12.0–15.0)
Immature Granulocytes: 1 %
Immature Granulocytes: 0 %
Lymphocytes Relative: 71 %
Lymphocytes Relative: 72 %
Lymphs Abs: 1.4 10*3/uL (ref 0.7–4.0)
Lymphs Abs: 1.3 10*3/uL (ref 0.7–4.0)
MCH: 27.5 pg (ref 26.0–34.0)
MCH: 28.8 pg (ref 26.0–34.0)
MCHC: 30.9 g/dL (ref 30.0–36.0)
MCHC: 32.6 g/dL (ref 30.0–36.0)
MCV: 88.9 fL (ref 80.0–100.0)
MCV: 88.3 fL (ref 80.0–100.0)
Monocytes Absolute: 0.1 10*3/uL (ref 0.1–1.0)
Monocytes Absolute: 0.1 10*3/uL (ref 0.1–1.0)
Monocytes Relative: 4 %
Monocytes Relative: 4 %
Neutro Abs: 0.4 10*3/uL — CL (ref 1.7–7.7)
Neutro Abs: 0.4 10*3/uL — CL (ref 1.7–7.7)
Neutrophils Relative %: 21 %
Neutrophils Relative %: 22 %
Platelets: 129 10*3/uL — ABNORMAL LOW (ref 150–400)
Platelets: 124 10*3/uL — ABNORMAL LOW (ref 150–400)
RBC: 2.98 MIL/uL — ABNORMAL LOW (ref 3.87–5.11)
RBC: 3.09 MIL/uL — ABNORMAL LOW (ref 3.87–5.11)
RDW: 13.5 % (ref 11.5–15.5)
RDW: 13.6 % (ref 11.5–15.5)
WBC: 1.9 10*3/uL — ABNORMAL LOW (ref 4.0–10.5)
WBC: 1.9 10*3/uL — ABNORMAL LOW (ref 4.0–10.5)
nRBC: 0 % (ref 0.0–0.2)
nRBC: 0 % (ref 0.0–0.2)

## 2023-01-15 LAB — COMPREHENSIVE METABOLIC PANEL
ALT: 37 U/L (ref 0–44)
AST: 32 U/L (ref 15–41)
Albumin: 3.1 g/dL — ABNORMAL LOW (ref 3.5–5.0)
Alkaline Phosphatase: 38 U/L (ref 38–126)
Anion gap: 6 (ref 5–15)
BUN: 17 mg/dL (ref 8–23)
CO2: 25 mmol/L (ref 22–32)
Calcium: 8.4 mg/dL — ABNORMAL LOW (ref 8.9–10.3)
Chloride: 103 mmol/L (ref 98–111)
Creatinine, Ser: 0.83 mg/dL (ref 0.44–1.00)
GFR, Estimated: >60 mL/min (ref >60)
Glucose, Bld: 103 mg/dL — ABNORMAL HIGH (ref 70–99)
Potassium: 3.3 mmol/L — ABNORMAL LOW (ref 3.5–5.1)
Sodium: 134 mmol/L — ABNORMAL LOW (ref 135–145)
Total Bilirubin: 0.5 mg/dL (ref <1.2)
Total Protein: 5.8 g/dL — ABNORMAL LOW (ref 6.5–8.1)

## 2023-01-15 LAB — MAGNESIUM: Magnesium: 1.5 mg/dL — ABNORMAL LOW (ref 1.7–2.4)

## 2023-01-15 LAB — PHOSPHORUS: Phosphorus: 2.6 mg/dL (ref 2.5–4.6)

## 2023-01-15 LAB — GLUCOSE, CAPILLARY
Glucose-Capillary: 69 mg/dL — ABNORMAL LOW (ref 70–99)
Glucose-Capillary: 74 mg/dL (ref 70–99)
Glucose-Capillary: 126 mg/dL — ABNORMAL HIGH (ref 70–99)

## 2023-01-15 LAB — HIV ANTIBODY (ROUTINE TESTING W REFLEX): HIV Screen 4th Generation wRfx: NONREACTIVE

## 2023-01-15 MED ORDER — POLYETHYLENE GLYCOL 3350 17 G PO PACK: 17.0000 g | PACK | Freq: Every day | ORAL | 0 refills | Status: AC

## 2023-01-15 MED ORDER — DOCUSATE SODIUM 100 MG PO CAPS: 100.0000 mg | ORAL_CAPSULE | Freq: Two times a day (BID) | ORAL | 0 refills | Status: AC

## 2023-01-15 MED ORDER — MAGNESIUM SULFATE 4 GM/100ML IV SOLN
4.0000 g | Freq: Once | INTRAVENOUS | Status: AC
  Administered 2023-01-15: 4 g via INTRAVENOUS
  Filled 2023-01-15: qty 100

## 2023-01-15 MED ORDER — OLMESARTAN MEDOXOMIL 40 MG PO TABS: 40.0000 mg | ORAL_TABLET | Freq: Every day | ORAL | 11 refills | Status: AC

## 2023-01-15 MED ORDER — ACETAMINOPHEN 325 MG PO TABS: 650.0000 mg | ORAL_TABLET | Freq: Four times a day (QID) | ORAL | 0 refills | Status: AC | PRN

## 2023-01-15 MED ORDER — HEPARIN SOD (PORK) LOCK FLUSH 100 UNIT/ML IV SOLN
500.0000 [IU] | INTRAVENOUS | Status: AC | PRN
  Administered 2023-01-15: 500 [IU]
  Filled 2023-01-15: qty 5

## 2023-01-15 MED ORDER — POTASSIUM CHLORIDE CRYS ER 20 MEQ PO TBCR
40.0000 meq | EXTENDED_RELEASE_TABLET | Freq: Two times a day (BID) | ORAL | Status: DC
  Administered 2023-01-15: 40 meq via ORAL
  Filled 2023-01-15: qty 2

## 2023-01-15 MED ORDER — HYDROCORTISONE ACETATE 25 MG RE SUPP: 25.0000 mg | Freq: Two times a day (BID) | RECTAL | 0 refills | Status: DC

## 2023-01-15 NOTE — Discharge Summary (Signed)
Physician Discharge Summary   Patient: Alice Hickman MRN: [id number] DOB: [birth year]  Admit date:     01/14/2023  Discharge date: 01/15/23  Discharge Physician: Marguerita Merles, DO   PCP: Diamantina Providence, FNP   Recommendations at discharge:   Follow-up with PCP within 1 to 2 weeks for repeat CBC, CMP, mag, Phos within 1 week Follow-up with medical oncology Dr. Bertis Ruddy  Discharge Diagnoses: Principal Problem:   Rectal bleeding  Resolved Problems:   * No resolved hospital problems. *  Hospital Course: he patient is a 72 year old overweight African-American female with a past medical history significant for Manson Passey to hypertension, gout, diabetes mellitus type 2, with dyslipidemia, history of endometrial cancer with associated chemotherapy-induced pancytopenia mucositis as well as other comorbidities who has been having longstanding issues with constipation after receiving chemotherapy doses.  Specifically she gets dexamethasone each chemotherapy dose and was initiated on a trial of stool softeners and the day of chemotherapy without success.  On 1212 she was very constipated and took MiraLAX without any improvement and took a small dose of calcium citrate which caused her to have abdominal cramping.  While trying to defecate the patient was straining tried to have a bowel movement and had a near syncopal event and eventually passed 1 bowel movement is normal within the second bowel movement is reddish.  Subsequently that night she is not able to eat redeveloped abdominal cramps and was unable to have another bowel movement.  She was eventually able to bear down to have another bowel movement and brown stool because it was runny but with wiping there is noted blood on the tissue and some of the toilet.  Another attempt at a bowel movement this morning because another near syncopal event and EMS was called given that the patient started staring.   Is brought to the ED and noted that she is  actually hypertensive and orthostatic vital signs were obtained and she did drop her orthostatics.  Given her ongoing constipation as well as mucositis she has had poor oral intake especially after her chemotherapy rounds.  She underwent CT scan of the abdomen pelvis which revealed colonic diverticulosis without diverticulitis.  GI was consulted given her rectal bleeding.  She was also noted to visit the ED on 01/13/2023 and hemoglobin at that time was 10 and she was noted to have FOBT positive.  She was admitted for constipation abdominal pain and near syncopal episode.  **Interim History  He was rehydrated and orthostatic vital signs were done and are negative.  Repeat blood count was stable.  She is medically stable for discharge at this time and she was given a bowel regimen and will need outpatient follow up with GI.  She will also follow-up with medical oncology in outpatient setting given that she is now improved and stable for discharge  Assessment and Plan:  Rectal bleeding and hematochezia -Likely in the setting of constipation -FOBT was positive -EDP is already consulted gastroenterology and appreciate their further evaluation -Will need to bowel regimen and monitoring her hemoglobin carefully -GI is recommending hemorrhoidal therapies with hydrocortisone suppositories and treating symptomatically for now and have signed off the case   Hyperkalemia -Likely hemolyzed sample -K+ trend: Recent Labs  Lab 01/07/23 0841 01/13/23 1200 01/14/23 1150 01/14/23 1230 01/14/23 1824 01/15/23 0158  K 4.1 3.6 3.4* 5.2* 3.3* 3.3*  -Monitor and trend and repeat CMP in the a.m.   Constipation -Bowel regimen adjusted by GI -Patient and daughter reports worsens after each  chemotherapy treatment -Was initiated on Colace 100 mg twice daily along with MiraLAX at bedtime -Patient has a difficult time getting in enough fluids -Patient also has thiazide diuretic added to her antihypertensive  medication and this will be discontinued given her poor oral intake   Near syncopal episode/syncope -Found to have positive orthostatic vital signs and suspected volume depletion secondary to poor p.o. intake as well as diuretic use. -Already given 1 L normal saline bolus and initiating maintenance IV fluids at 75 cc for now -Repeat orthostatic vital signs in the a.m. and will need PT OT to further evaluate and treat -Therapy signed off the case and she was hydrated and was not orthostatic.   Hyperlipidemia -Continue Rosuvastatin 20 mg p.o. daily  Hypomagnesemia -Patient's Mag Level Trend: Recent Labs  Lab 01/15/23 0158  MG 1.5*  -Replete with prior to discharge -Continue to Monitor and Replete as Necessary -Repeat Mag in the AM   Diabetes mellitus type 2 -Continue with sensitive NovoLog sliding scale insulin AC -Check hemoglobin A1c in a.m.  Endometrial cancer with chemotherapy related pancytopenia Appreciate oncology input Recent chemotherapy complicated by mucositis, pancytopenia constipation Guards to the pancytopenia oncology documents will not need G-CSF support unless she develops neutropenic fever    Hypertension On olmesartan/HCT combo at home Recommend discontinuing hydrochlorothiazide Supine blood pressures are elevated but patient developed orthostatic hypotension with syncope therefore holding antihypertensive medications for now  Pancytopenia -CBC Trend: Recent Labs  Lab 01/07/23 0841 01/07/23 0841 01/13/23 1200 01/14/23 1150 01/14/23 1230 01/15/23 0158 01/15/23 1456  WBC 2.9*  --  1.9* 1.8*  --  1.9* 1.9*  HGB 10.3*   < > 10.0* 9.8*   < > 8.2* 8.9*  HCT 31.5*  --  31.6* 31.1*   < > 26.5* 27.3*  MCV 86.5  --  88.8 87.1  --  88.9 88.3  PLT 186   < > 172 160  --  129* 124*   < > = values in this interval not displayed.  -Anemia panel done and showed an iron level 135, UIBC 122, TIBC 297, saturation of 59%, B12 725, folate level 10.5, vitamin B12  300 -Has outpatient follow-up with medical oncology and is likely in setting of chemotherapy  Hypoalbuminemia -Patient's Albumin Trend: Recent Labs  Lab 01/07/23 0841 01/13/23 1200 01/15/23 0158  ALBUMIN 3.9 3.5 3.1*  -Continue to Monitor and Trend and repeat CMP in the AM   Consultants: Medical oncology, gastroenterology Procedures performed: As delineated as above Disposition: Home Diet recommendation:  Discharge Diet Orders (From admission, onward)     Start     Ordered   01/15/23 0000  Diet - low sodium heart healthy        01/15/23 1526           Cardiac diet DISCHARGE MEDICATION: Allergies as of 01/15/2023       Reactions   Penicillins Other (See Comments)   Rash over whole body, difficulty breathing throat swelling   Lovastatin Other (See Comments)   REACTION: leg cramps   Metformin Nausea And Vomiting   REACTION: GI upset        Medication List     STOP taking these medications    olmesartan-hydrochlorothiazide 40-25 MG tablet Commonly known as: BENICAR HCT       TAKE these medications    acetaminophen 325 MG tablet Commonly known as: TYLENOL Take 2 tablets (650 mg total) by mouth every 6 (six) hours as needed for mild pain (pain score 1-3) (  or Fever >/= 101).   allopurinol 100 MG tablet Commonly known as: ZYLOPRIM Take 100 mg by mouth daily.   aspirin 81 MG chewable tablet Chew 81 mg by mouth daily.   Colchicine 0.6 MG Caps Take 0.6 mg by mouth 2 (two) times daily as needed (gout flare pain).   dexamethasone 4 MG tablet Commonly known as: DECADRON Take 2 tabs at the night before and 2 tab the morning of chemotherapy, every 3 weeks, by mouth x 6 cycles What changed:  how much to take how to take this when to take this   docusate sodium 100 MG capsule Commonly known as: COLACE Take 1 capsule (100 mg total) by mouth 2 (two) times daily.   ferrous sulfate 325 (65 FE) MG tablet Take 325 mg by mouth daily with breakfast.    glimepiride 2 MG tablet Commonly known as: AMARYL TAKE ONE TABLET BY MOUTH ONCE DAILY BEFORE BREAKFAST What changed: See the new instructions.   hydrocortisone 25 MG suppository Commonly known as: ANUSOL-HC Place 1 suppository (25 mg total) rectally 2 (two) times daily.   latanoprost 0.005 % ophthalmic solution Commonly known as: XALATAN Place 1 drop into both eyes at bedtime.   lidocaine-prilocaine cream Commonly known as: EMLA Apply to affected area once   magic mouthwash (nystatin, lidocaine, diphenhydrAMINE, alum & mag hydroxide) suspension Swish and spit 5 mls by mouth 4 times a day for 7 days   olmesartan 40 MG tablet Commonly known as: BENICAR Take 1 tablet (40 mg total) by mouth daily.   ondansetron 8 MG tablet Commonly known as: Zofran Take 1 tablet (8 mg total) by mouth every 8 (eight) hours as needed for nausea or vomiting. Start on the third day after chemotherapy.   polyethylene glycol 17 g packet Commonly known as: MIRALAX / GLYCOLAX Take 17 g by mouth at bedtime.   prochlorperazine 10 MG tablet Commonly known as: COMPAZINE Take 1 tablet (10 mg total) by mouth every 6 (six) hours as needed for nausea or vomiting.   rosuvastatin 20 MG tablet Commonly known as: CRESTOR Take 1 tablet (20 mg total) by mouth daily.   senna-docusate 8.6-50 MG tablet Commonly known as: Senokot-S Take 2 tablets by mouth at bedtime. For AFTER surgery, do not take if having diarrhea   traMADol 50 MG tablet Commonly known as: ULTRAM Take 1 tablet (50 mg total) by mouth every 6 (six) hours as needed for severe pain (pain score 7-10).        Discharge Exam: Filed Weights   01/14/23 0859  Weight: 76.7 kg   Vitals:   01/15/23 1458 01/15/23 1500  BP: (!) 149/67 (!) 146/64  Pulse: 80 78  Resp: 19   Temp: 98.1 F (36.7 C)   SpO2: 100% 100%   Examination: Physical Exam:  Constitutional: WN/WD overweight African-American female in no acute distress Respiratory:  Diminished to auscultation bilaterally, no wheezing, rales, rhonchi or crackles. Normal respiratory effort and patient is not tachypenic. No accessory muscle use.  Unlabored breathing Cardiovascular: RRR, no murmurs / rubs / gallops. S1 and S2 auscultated. No extremity edema..  Abdomen: Soft, non-tender, distended secondary to body habitus. Bowel sounds positive.  GU: Deferred. Musculoskeletal: No clubbing / cyanosis of digits/nails. No joint deformity upper and lower extremities Skin: No rashes, lesions, ulcers on limited skin evaluation. No induration; Warm and dry.  Neurologic: CN 2-12 grossly intact with no focal deficits. Romberg sign and cerebellar reflexes not assessed.  Psychiatric: Normal judgment and insight. Alert and  oriented x 3. Normal mood and appropriate affect.   Condition at discharge: stable  The results of significant diagnostics from this hospitalization (including imaging, microbiology, ancillary and laboratory) are listed below for reference.   Imaging Studies: CT ABDOMEN PELVIS W CONTRAST Result Date: 01/13/2023 CLINICAL DATA:  Acute abdominal pain. Constipation. High-grade endometrial carcinoma. * Tracking Code: BO * EXAM: CT ABDOMEN AND PELVIS WITH CONTRAST TECHNIQUE: Multidetector CT imaging of the abdomen and pelvis was performed using the standard protocol following bolus administration of intravenous contrast. RADIATION DOSE REDUCTION: This exam was performed according to the departmental dose-optimization program which includes automated exposure control, adjustment of the mA and/or kV according to patient size and/or use of iterative reconstruction technique. CONTRAST:  OMNIPAQUE IOHEXOL 300 MG/ML  SOLN COMPARISON:  10/28/2022 FINDINGS: Lower Chest: No acute findings. Hepatobiliary: No suspicious hepatic masses identified. Gallbladder is unremarkable. No evidence of biliary ductal dilatation. Pancreas:  No mass or inflammatory changes. Spleen: Within normal  limits in size and appearance. Adrenals/Urinary Tract: No suspicious masses identified. No evidence of ureteral calculi or hydronephrosis. Stomach/Bowel: No evidence of obstruction, inflammatory process or abnormal fluid collections. Normal appendix visualized. Diverticulosis is seen mainly involving the descending and sigmoid colon, however there is no evidence of diverticulitis. Vascular/Lymphatic: No pathologically enlarged lymph nodes. No acute vascular findings. Reproductive: Prior hysterectomy noted. Vaginal cuff and adnexal regions are unremarkable in appearance. No No evidence of mass or ascites. Other:  None. Musculoskeletal:  No suspicious bone lesions identified. IMPRESSION: No acute findings. Colonic diverticulosis, without radiographic evidence of diverticulitis. Prior hysterectomy. No evidence of recurrent or metastatic carcinoma. Electronically Signed   By: Danae Orleans M.D.   On: 01/13/2023 15:11   Microbiology: Results for orders placed or performed in visit on 01/19/19  Novel Coronavirus, NAA ([facility])     Status: None   Collection Time: 01/19/19  3:28 PM   Specimen: Nasopharyngeal(NP) swabs in vial transport medium   NASOPHARYNGE  SCREENIN  Result Value Ref Range Status   SARS-CoV-2, NAA Not Detected Not Detected Final    Comment: This nucleic acid amplification test was developed and its performance characteristics determined by [employer]. Nucleic acid amplification tests include PCR and TMA. This test has not been FDA cleared or approved. This test has been authorized by FDA under an Emergency Use Authorization (EUA). This test is only authorized for the duration of time the declaration that circumstances exist justifying the authorization of the emergency use of in vitro diagnostic tests for detection of SARS-CoV-2 virus and/or diagnosis of COVID-19 infection under section 564(b)(1) of the Act, 21 U.S.C. 782NFA-2(Z) (1), unless the authorization is terminated or  revoked sooner. When diagnostic testing is negative, the possibility of a false negative result should be considered in the context of a patient's recent exposures and the presence of clinical signs and symptoms consistent with COVID-19. An individual without symptoms of COVID-19 and who is not shedding SARS-CoV-2 virus would  expect to have a negative (not detected) result in this assay.    Labs: CBC: Recent Labs  Lab 01/13/23 1200 01/14/23 1150 01/14/23 1230 01/14/23 1824 01/15/23 0158 01/15/23 1456  WBC 1.9* 1.8*  --   --  1.9* 1.9*  NEUTROABS 1.2* 0.6*  --   --  0.4* 0.4*  HGB 10.0* 9.8* 10.5* 9.8* 8.2* 8.9*  HCT 31.6* 31.1* 31.0* 29.9* 26.5* 27.3*  MCV 88.8 87.1  --   --  88.9 88.3  PLT 172 160  --   --  129*  124*   Basic Metabolic Panel: Recent Labs  Lab 01/13/23 1200 01/14/23 1150 01/14/23 1230 01/14/23 1824 01/15/23 0158  NA 136 135 136 134* 134*  K 3.6 3.4* 5.2* 3.3* 3.3*  CL 102 103 108 101 103  CO2 25 25  --  25 25  GLUCOSE 172* 90 82 117* 103*  BUN 23 16 18 15 17   CREATININE 1.21* 1.09* 1.10* 0.99 0.83  CALCIUM 9.1 9.2  --  8.8* 8.4*  MG  --   --   --   --  1.5*  PHOS  --   --   --   --  2.6   Liver Function Tests: Recent Labs  Lab 01/13/23 1200 01/15/23 0158  AST 33 32  ALT 31 37  ALKPHOS 43 38  BILITOT 0.7 0.5  PROT 6.6 5.8*  ALBUMIN 3.5 3.1*   CBG: Recent Labs  Lab 01/14/23 1824 01/14/23 2120 01/15/23 0750 01/15/23 0809 01/15/23 1134  GLUCAP 110* 156* 69* 74 126*   Discharge time spent: greater than 30 minutes.  Signed: Marguerita Merles, DO [facility] 01/17/2023

## 2023-01-15 NOTE — Progress Notes (Signed)
Hypoglycemic Event  CBG: 69  Treatment: 8 oz juice/soda  Symptoms: None  Follow-up CBG: Time:0810 CBG Result:74  Possible Reasons for Event: Inadequate meal intake  Comments/MD notified:    Avelina Laine

## 2023-01-15 NOTE — Progress Notes (Signed)
AVS given to patient and explained at the bedside. Medications and follow up appointments have been explained with pt verbalizing understanding.  

## 2023-01-15 NOTE — Evaluation (Signed)
Physical Therapy Brief Evaluation and Discharge Note Patient Details Name: Alice Hickman MRN: [id number] DOB: 1950/03/04 Today's Date: 01/15/2023   History of Present Illness  Pt is a 72 year old female admitted 01/14/23 for Rectal bleeding and hematochezia -Likely in the setting of constipation. Past medical history significant of hypertension, gout, diabetes mellitus 2, dyslipidemia, endometrial cancer with associated chemotherapy-induced pancytopenia and mucositis  Clinical Impression  Patient evaluated by Physical Therapy with no further acute PT needs identified. All education has been completed and the patient has no further questions.  Pt able to ambulate in hallway without assistive device and no overt LOB.  Pt lives with daughter who assists as needed. See below for any follow-up Physical Therapy or equipment needs. PT is signing off. Thank you for this referral.        PT Assessment Patient does not need any further PT services  Assistance Needed at Discharge  None    Equipment Recommendations None recommended by PT  Recommendations for Other Services       Precautions/Restrictions Precautions Precautions: Fall        Mobility  Bed Mobility          Transfers Overall transfer level: Needs assistance Equipment used: None Transfers: Sit to/from Stand Sit to Stand: Supervision, Contact guard assist                Ambulation/Gait Ambulation/Gait assistance: Contact guard assist, Supervision Gait Distance (Feet): 240 Feet Assistive device: None Gait Pattern/deviations: Step-through pattern, Decreased stride length Gait Speed: Pace WFL General Gait Details: no unsteadiness or LOB observed, pt denies symptoms  Home Activity Instructions    Stairs            Modified Rankin (Stroke Patients Only)        Balance Overall balance assessment: No apparent balance deficits (not formally assessed)                        Pertinent  Vitals/Pain   Pain Assessment Pain Assessment: No/denies pain     Home Living Family/patient expects to be discharged to:: Private residence Living Arrangements: Children Available Help at Discharge: Family Home Environment: Stairs to enter  Landscape architect of Steps: 3 Home Equipment: None   Additional Comments: lives with daughter and daughter's fiance    Prior Function Level of Independence: Needs assistance Comments: independent with mobility, daughter assists as needing, pt reports daughter manages medication    UE/LE Assessment        LE ROM/Strength/Tone/Coordination: [facility]      Communication   Communication Communication: No apparent difficulties     Cognition Overall Cognitive Status: Appears within functional limits for tasks assessed/performed       General Comments      Exercises     Assessment/Plan    PT Problem List         PT Visit Diagnosis Difficulty in walking, not elsewhere classified (R26.2)    No Skilled PT Patient at baseline level of functioning;Patient will have necessary level of assist by caregiver at discharge   Co-evaluation                AMPAC 6 Clicks Help needed turning from your back to your side while in a flat bed without using bedrails?: None Help needed moving from lying on your back to sitting on the side of a flat bed without using bedrails?: A Little Help needed moving to and from a bed to a chair (  including a wheelchair)?: A Little Help needed standing up from a chair using your arms (e.g., wheelchair or bedside chair)?: A Little Help needed to walk in hospital room?: A Little Help needed climbing 3-5 steps with a railing? : A Little 6 Click Score: 19      End of Session Equipment Utilized During Treatment: Gait belt Activity Tolerance: Patient tolerated treatment well Patient left: in chair;with call bell/phone within reach;with family/visitor present   PT Visit Diagnosis: Difficulty in walking, not  elsewhere classified (R26.2)     Time: 9629-5284 PT Time Calculation (min) (ACUTE ONLY): 8 min  Charges:   PT Evaluation $PT Eval Low Complexity: 1 Low     Kati PT, DPT Physical Therapist Acute Rehabilitation Services Office: [phone]   [name]  01/15/2023, 3:13 PM

## 2023-01-15 NOTE — Care Management Obs Status (Signed)
MEDICARE OBSERVATION STATUS NOTIFICATION   Patient Details  Name: Alice Hickman MRN: [id number] Date of Birth: 02/16/1950   Medicare Observation Status Notification Given:  Yes    Adrian Prows, RN 01/15/2023, 3:27 PM

## 2023-01-15 NOTE — TOC Initial Note (Signed)
Transition of Care [facility]) - Initial/Assessment Note    Patient Details  Name: Alice Hickman MRN: [id number] Date of Birth: 1950-05-08  Transition of Care [facility]) CM/SW Contact:    Adrian Prows, RN Phone Number: 01/15/2023, 3:33 PM  Clinical Narrative:                 Spoke w/ pt and dttr Margrett Rud [phone]) in room; pt lives at home w/ her dtr; they paln for her to return at d/c; pt has transportation; her dtr verified insurance and PCP; she denies pt experiencing SDOH risks; pt has cane, walker, and BSC; she does not receive HH services or home oxygen; no PT follow up recc; no TOC needs.  Expected Discharge Plan: Home/Self Care Barriers to Discharge: Continued Medical Work up   Patient Goals and CMS Choice Patient states their goals for this hospitalization and ongoing recovery are:: per [employer] (dtr) patient will return home CMS Medicare.gov Compare Post Acute Care list provided to:: Patient Represenative (must comment) (Summer Benjiman Core (dtr)) Choice offered to / list presented to : Adult Children (Summer [place]) [place] ownership interest in [facility].provided to:: Adult Children    Expected Discharge Plan and Services   Discharge Planning Services: CM Consult   Living arrangements for the past 2 months: Single Family Home Expected Discharge Date: 01/15/23                                    Prior Living Arrangements/Services Living arrangements for the past 2 months: Single Family Home Lives with:: Adult Children Patient language and need for interpreter reviewed:: Yes Do you feel safe going back to the place where you live?: Yes      Need for Family Participation in Patient Care: Yes (Comment) Care giver support system in place?: Yes (comment) Current home services: DME (cane, walker, BSC) Criminal Activity/Legal Involvement Pertinent to Current Situation/Hospitalization: No - Comment as needed  Activities of Daily Living    ADL Screening (condition at time of admission) Independently performs ADLs?: No Does the patient have a NEW difficulty with bathing/dressing/toileting/self-feeding that is expected to last >3 days?: No Does the patient have a NEW difficulty with getting in/out of bed, walking, or climbing stairs that is expected to last >3 days?: No Does the patient have a NEW difficulty with communication that is expected to last >3 days?: No Is the patient deaf or have difficulty hearing?: No Does the patient have difficulty seeing, even when wearing glasses/contacts?: No Does the patient have difficulty concentrating, remembering, or making decisions?: No  Permission Sought/Granted Permission sought to share information with : Case Manager Permission granted to share information with : Yes, Verbal Permission Granted  Share Information with NAME: Case Manager     Permission granted to share info w Relationship: Summer Dault (dtr) [phone]     Emotional Assessment Appearance:: Other (Comment Required (unable to assess) Attitude/Demeanor/Rapport: Unable to Assess Affect (typically observed): Unable to Assess   Alcohol / Substance Use: Other (comment) (unable to assess) Psych Involvement: No (comment)  Admission diagnosis:  Rectal bleeding [K62.5] Patient Active Problem List   Diagnosis Date Noted   Rectal bleeding 01/14/2023   Pancytopenia, acquired (HCC) 01/07/2023   Mucositis due to antineoplastic therapy 01/07/2023   Itchy scalp 01/07/2023   Chronic constipation 11/30/2022   Endometrial cancer (HCC) 11/18/2022   Prediabetes 12/07/2019   Abnormal stress test 09/14/2018  Abnormal EKG 07/27/2018   SOB (shortness of breath) 07/06/2018   Cardiomegaly 07/06/2018   Peripheral neuropathy 11/14/2014   Gout 11/14/2014   Right foot pain 07/18/2014   Cellulitis of left foot 07/18/2014   Hypokalemia 05/08/2014   Pes planus of both feet 05/01/2013   Osteoarthritis of both knees 12/01/2012    Swelling of both ankles 10/31/2012   Generalized osteoarthritis 10/27/2012   Recurrent falls 09/11/2012   Preop cardiovascular exam 05/12/2010   ANXIETY, SITUATIONAL 09/02/2009   OSTEOPENIA 09/02/2009   GLAUCOMA 07/04/2008   Mixed hyperlipidemia 05/01/2007   ALLERGIC RHINITIS 05/01/2007   Diabetes mellitus with neuropathy (HCC) 11/15/2006   CARPAL TUNNEL SYNDROME, RIGHT 11/15/2006   Essential hypertension 11/15/2006   GERD 11/15/2006   PCP:  Diamantina Providence, FNP Pharmacy:   [id number] - [address] ([place]), [place] - [address] [place] ([place]) [place] [address] Phone: [phone] Fax: [phone]  [facility] - [place], [place] - [address] [place] [address] Phone: [phone] Fax: [phone]     Social Drivers of Health (SDOH) Social History: SDOH Screenings   Food Insecurity: No Food Insecurity (01/15/2023)  Housing: Low Risk  (01/15/2023)  Transportation Needs: No Transportation Needs (01/15/2023)  Utilities: Not At Risk (01/15/2023)  Depression (PHQ2-9): Low Risk  (12/06/2022)  Tobacco Use: Low Risk  (01/15/2023)   SDOH Interventions: Food Insecurity Interventions: Intervention Not Indicated, Inpatient TOC Housing Interventions: Intervention Not Indicated, Inpatient TOC Transportation Interventions: Intervention Not Indicated, Inpatient TOC Utilities Interventions: Intervention Not Indicated, Inpatient TOC   Readmission Risk Interventions     No data to display

## 2023-01-17 ENCOUNTER — Telehealth: Payer: Self-pay | Admitting: *Deleted

## 2023-01-17 ENCOUNTER — Telehealth: Payer: Self-pay

## 2023-01-17 ENCOUNTER — Other Ambulatory Visit (HOSPITAL_COMMUNITY): Payer: Self-pay

## 2023-01-17 NOTE — Telephone Encounter (Signed)
Called daughter to see how her Mom is doing after being d/ced home from the hospital. Summer said that her Mom is doing good and she will call the office back for questions/concerns.

## 2023-01-17 NOTE — ED Provider Notes (Signed)
[facility] 4TH FLOOR PROGRESSIVE CARE AND UROLOGY Provider Note   CSN: [id number] Arrival date & time: 01/14/23  0840     History  Chief Complaint  Patient presents with   Constipation   Abdominal Pain    Alice Hickman is a 72 y.o. female.  Patient presents with some bright red blood per rectum.  Patient has history of diabetes hypertension  The history is provided by the patient and medical records. No language interpreter was used.  Rectal Bleeding Quality:  Bright red Amount:  Scant Timing:  Intermittent Chronicity:  New Context: not anal fissures   Similar prior episodes: no   Relieved by:  Nothing Worsened by:  Nothing Associated symptoms: no abdominal pain and no fever   Risk factors: no anticoagulant use        Home Medications Prior to Admission medications   Medication Sig Start Date End Date Taking? Authorizing Provider  allopurinol (ZYLOPRIM) 100 MG tablet Take 100 mg by mouth daily.   Yes [provider]  Colchicine 0.6 MG CAPS Take 0.6 mg by mouth 2 (two) times daily as needed (gout flare pain).   Yes [provider]  dexamethasone (DECADRON) 4 MG tablet Take 2 tabs at the night before and 2 tab the morning of chemotherapy, every 3 weeks, by mouth x 6 cycles Patient taking differently: Take 8 mg by mouth See admin instructions. Take 2 tabs at the night before and 2 tab the morning of chemotherapy, every 3 weeks, by mouth x 6 cycles 11/30/22  Yes Gorsuch, Ni, MD  glimepiride (AMARYL) 2 MG tablet TAKE ONE TABLET BY MOUTH ONCE DAILY BEFORE BREAKFAST Patient taking differently: Take 2 mg by mouth daily. 11/06/15  Yes Corwin Levins, MD  lidocaine-prilocaine (EMLA) cream Apply to affected area once 12/16/22  Yes Gorsuch, Ni, MD  olmesartan (BENICAR) 40 MG tablet Take 1 tablet (40 mg total) by mouth daily. 01/15/23 01/15/24 Yes Sheikh, Omair Latif, DO  ondansetron (ZOFRAN) 8 MG tablet Take 1 tablet (8 mg total) by mouth every 8 (eight) hours  as needed for nausea or vomiting. Start on the third day after chemotherapy. 12/16/22  Yes Artis Delay, MD  prochlorperazine (COMPAZINE) 10 MG tablet Take 1 tablet (10 mg total) by mouth every 6 (six) hours as needed for nausea or vomiting. 12/16/22  Yes Gorsuch, Ni, MD  senna-docusate (SENOKOT-S) 8.6-50 MG tablet Take 2 tablets by mouth at bedtime. For AFTER surgery, do not take if having diarrhea 10/22/22  Yes Carver Fila, MD  traMADol (ULTRAM) 50 MG tablet Take 1 tablet (50 mg total) by mouth every 6 (six) hours as needed for severe pain (pain score 7-10). 12/21/22  Yes Artis Delay, MD  acetaminophen (TYLENOL) 325 MG tablet Take 2 tablets (650 mg total) by mouth every 6 (six) hours as needed for mild pain (pain score 1-3) (or Fever >/= 101). 01/15/23   Marguerita Merles Latif, DO  aspirin 81 MG chewable tablet Chew 81 mg by mouth daily. Patient not taking: Reported on 01/14/2023    [provider]  docusate sodium (COLACE) 100 MG capsule Take 1 capsule (100 mg total) by mouth 2 (two) times daily. 01/15/23   Marguerita Merles Latif, DO  ferrous sulfate 325 (65 FE) MG tablet Take 325 mg by mouth daily with breakfast. Patient not taking: Reported on 01/14/2023    [provider]  hydrocortisone (ANUSOL-HC) 25 MG suppository Place 1 suppository (25 mg total) rectally 2 (two) times daily. 01/15/23  Sheikh, Omair Latif, DO  latanoprost (XALATAN) 0.005 % ophthalmic solution Place 1 drop into both eyes at bedtime. Patient not taking: Reported on 01/14/2023    [provider]  magic mouthwash (nystatin, lidocaine, diphenhydrAMINE, alum & mag hydroxide) suspension Swish and spit 5 mls by mouth 4 times a day for 7 days 01/07/23   Artis Delay, MD  polyethylene glycol (MIRALAX / GLYCOLAX) 17 g packet Take 17 g by mouth at bedtime. 01/15/23   Marguerita Merles Latif, DO  rosuvastatin (CRESTOR) 20 MG tablet Take 1 tablet (20 mg total) by mouth daily. 11/05/22 02/03/23  PatwardhanAnabel Bene, MD       Allergies    Penicillins, Lovastatin, and Metformin    Review of Systems   Review of Systems  Constitutional:  Negative for appetite change, fatigue and fever.  HENT:  Negative for congestion, ear discharge and sinus pressure.   Eyes:  Negative for discharge.  Respiratory:  Negative for cough.   Cardiovascular:  Negative for chest pain.  Gastrointestinal:  Positive for hematochezia. Negative for abdominal pain and diarrhea.       Rectal bleeding  Genitourinary:  Negative for frequency and hematuria.  Musculoskeletal:  Negative for back pain.  Skin:  Negative for rash.  Neurological:  Negative for seizures and headaches.  Psychiatric/Behavioral:  Negative for hallucinations.     Physical Exam Updated Vital Signs BP (!) 146/64 (BP Location: Right Arm)   Pulse 78   Temp 98.1 F (36.7 C) (Oral)   Resp 19   Ht 5\' 3"  (1.6 m)   Wt 76.7 kg   SpO2 100%   BMI 29.94 kg/m  Physical Exam Vitals and nursing note reviewed.  Constitutional:      Appearance: She is well-developed.  HENT:     Head: Normocephalic.     Nose: Nose normal.  Eyes:     General: No scleral icterus.    Conjunctiva/sclera: Conjunctivae normal.  Neck:     Thyroid: No thyromegaly.  Cardiovascular:     Rate and Rhythm: Normal rate and regular rhythm.     Heart sounds: No murmur heard.    No friction rub. No gallop.  Pulmonary:     Breath sounds: No stridor. No wheezing or rales.  Chest:     Chest wall: No tenderness.  Abdominal:     General: There is no distension.     Tenderness: There is no abdominal tenderness. There is no rebound.  Genitourinary:    Comments: Brown stool heme positive Musculoskeletal:        General: Normal range of motion.     Cervical back: Neck supple.  Lymphadenopathy:     Cervical: No cervical adenopathy.  Skin:    Findings: No erythema or rash.  Neurological:     Mental Status: She is alert and oriented to person, place, and time.     Motor: No abnormal muscle  tone.     Coordination: Coordination normal.  Psychiatric:        Behavior: Behavior normal.     ED Results / Procedures / Treatments   Labs (all labs ordered are listed, but only abnormal results are displayed) Labs Reviewed  CBC WITH DIFFERENTIAL/PLATELET - Abnormal; Notable for the following components:      Result Value   WBC 1.8 (*)    RBC 3.57 (*)    Hemoglobin 9.8 (*)    HCT 31.1 (*)    Neutro Abs 0.6 (*)    All other components within  normal limits  BASIC METABOLIC PANEL - Abnormal; Notable for the following components:   Potassium 3.4 (*)    Creatinine, Ser 1.09 (*)    GFR, Estimated 55 (*)    All other components within normal limits  APTT - Abnormal; Notable for the following components:   aPTT 22 (*)    All other components within normal limits  HEMOGLOBIN AND HEMATOCRIT, BLOOD - Abnormal; Notable for the following components:   Hemoglobin 9.8 (*)    HCT 29.9 (*)    All other components within normal limits  IRON AND TIBC - Abnormal; Notable for the following components:   Iron 175 (*)    Saturation Ratios 59 (*)    All other components within normal limits  FERRITIN - Abnormal; Notable for the following components:   Ferritin 725 (*)    All other components within normal limits  CBC WITH DIFFERENTIAL/PLATELET - Abnormal; Notable for the following components:   WBC 1.9 (*)    RBC 2.98 (*)    Hemoglobin 8.2 (*)    HCT 26.5 (*)    Platelets 129 (*)    Neutro Abs 0.4 (*)    All other components within normal limits  GLUCOSE, CAPILLARY - Abnormal; Notable for the following components:   Glucose-Capillary 66 (*)    All other components within normal limits  BASIC METABOLIC PANEL - Abnormal; Notable for the following components:   Sodium 134 (*)    Potassium 3.3 (*)    Glucose, Bld 117 (*)    Calcium 8.8 (*)    All other components within normal limits  COMPREHENSIVE METABOLIC PANEL - Abnormal; Notable for the following components:   Sodium 134 (*)     Potassium 3.3 (*)    Glucose, Bld 103 (*)    Calcium 8.4 (*)    Total Protein 5.8 (*)    Albumin 3.1 (*)    All other components within normal limits  MAGNESIUM - Abnormal; Notable for the following components:   Magnesium 1.5 (*)    All other components within normal limits  GLUCOSE, CAPILLARY - Abnormal; Notable for the following components:   Glucose-Capillary 66 (*)    All other components within normal limits  GLUCOSE, CAPILLARY - Abnormal; Notable for the following components:   Glucose-Capillary 110 (*)    All other components within normal limits  GLUCOSE, CAPILLARY - Abnormal; Notable for the following components:   Glucose-Capillary 156 (*)    All other components within normal limits  GLUCOSE, CAPILLARY - Abnormal; Notable for the following components:   Glucose-Capillary 69 (*)    All other components within normal limits  GLUCOSE, CAPILLARY - Abnormal; Notable for the following components:   Glucose-Capillary 126 (*)    All other components within normal limits  CBC WITH DIFFERENTIAL/PLATELET - Abnormal; Notable for the following components:   WBC 1.9 (*)    RBC 3.09 (*)    Hemoglobin 8.9 (*)    HCT 27.3 (*)    Platelets 124 (*)    Neutro Abs 0.4 (*)    All other components within normal limits  POC OCCULT BLOOD, ED - Abnormal; Notable for the following components:   Fecal Occult Bld POSITIVE (*)    All other components within normal limits  I-STAT CHEM 8, ED - Abnormal; Notable for the following components:   Potassium 5.2 (*)    Creatinine, Ser 1.10 (*)    Calcium, Ion 0.91 (*)    Hemoglobin 10.5 (*)  HCT 31.0 (*)    All other components within normal limits  PROTIME-INR  HIV ANTIBODY (ROUTINE TESTING W REFLEX)  VITAMIN B12  FOLATE  PHOSPHORUS  GLUCOSE, CAPILLARY  TYPE AND SCREEN    EKG None  Radiology No results found.  Procedures Procedures    Medications Ordered in ED Medications  sodium chloride 0.9 % bolus 1,000 mL (0 mLs  Intravenous Stopped 01/15/23 1604)  magnesium sulfate IVPB 4 g 100 mL (0 g Intravenous Stopped 01/15/23 1605)  heparin lock flush 100 unit/mL (500 Units Intracatheter Given 01/15/23 1612)    ED Course/ Medical Decision Making/ A&P                                 Medical Decision Making Amount and/or Complexity of Data Reviewed Labs: ordered.  Risk Decision regarding hospitalization.   Patient with an positive stool.  Patient also has bright red blood per rectum.  She will be admitted to medicine with GI consult        Final Clinical Impression(s) / ED Diagnoses Final diagnoses:  Rectal bleeding    Rx / DC Orders ED Discharge Orders          Ordered    olmesartan (BENICAR) 40 MG tablet  Daily        01/15/23 1526    polyethylene glycol (MIRALAX / GLYCOLAX) 17 g packet  Daily at bedtime        01/15/23 1526    hydrocortisone (ANUSOL-HC) 25 MG suppository  2 times daily        01/15/23 1526    docusate sodium (COLACE) 100 MG capsule  2 times daily        01/15/23 1526    acetaminophen (TYLENOL) 325 MG tablet  Every 6 hours PRN        01/15/23 1526    Increase activity slowly        01/15/23 1526    Diet - low sodium heart healthy        01/15/23 1526    Call MD for:  temperature >100.4        01/15/23 1526    Call MD for:  persistant nausea and vomiting        01/15/23 1526    Call MD for:  severe uncontrolled pain        01/15/23 1526    Call MD for:  redness, tenderness, or signs of infection (pain, swelling, redness, odor or green/yellow discharge around incision site)        01/15/23 1526    Call MD for:  difficulty breathing, headache or visual disturbances        01/15/23 1526    Call MD for:  hives        01/15/23 1526    Call MD for:  persistant dizziness or light-headedness        01/15/23 1526    Call MD for:  extreme fatigue        01/15/23 1526    Discharge instructions       Comments: You were cared for by a hospitalist during your hospital  stay. If you have any questions about your discharge medications or the care you received while you were in the hospital after you are discharged, you can call the unit and ask to speak with the hospitalist on call if the hospitalist that took care of you is not available. Once you  are discharged, your primary care physician will handle any further medical issues. Please note that NO REFILLS for any discharge medications will be authorized once you are discharged, as it is imperative that you return to your primary care physician (or establish a relationship with a primary care physician if you do not have one) for your aftercare needs so that they can reassess your need for medications and monitor your lab values.  Follow up with PCP, Medical Oncology, and Gastroenterology in the outpatient setting. Take all medications as prescribed. If symptoms change or worsen please return to the ED for evaluation   01/15/23 1526              Bethann Berkshire, MD 01/17/23 1651

## 2023-01-17 NOTE — Hospital Course (Addendum)
he patient is a 72 year old overweight African-American female with a past medical history significant for Manson Passey to hypertension, gout, diabetes mellitus type 2, with dyslipidemia, history of endometrial cancer with associated chemotherapy-induced pancytopenia mucositis as well as other comorbidities who has been having longstanding issues with constipation after receiving chemotherapy doses.  Specifically she gets dexamethasone each chemotherapy dose and was initiated on a trial of stool softeners and the day of chemotherapy without success.  On 1212 she was very constipated and took MiraLAX without any improvement and took a small dose of calcium citrate which caused her to have abdominal cramping.  While trying to defecate the patient was straining tried to have a bowel movement and had a near syncopal event and eventually passed 1 bowel movement is normal within the second bowel movement is reddish.  Subsequently that night she is not able to eat redeveloped abdominal cramps and was unable to have another bowel movement.  She was eventually able to bear down to have another bowel movement and brown stool because it was runny but with wiping there is noted blood on the tissue and some of the toilet.  Another attempt at a bowel movement this morning because another near syncopal event and EMS was called given that the patient started staring.   Is brought to the ED and noted that she is actually hypertensive and orthostatic vital signs were obtained and she did drop her orthostatics.  Given her ongoing constipation as well as mucositis she has had poor oral intake especially after her chemotherapy rounds.  She underwent CT scan of the abdomen pelvis which revealed colonic diverticulosis without diverticulitis.  GI was consulted given her rectal bleeding.  She was also noted to visit the ED on 01/13/2023 and hemoglobin at that time was 10 and she was noted to have FOBT positive.  She was admitted for constipation  abdominal pain and near syncopal episode.  **Interim History  He was rehydrated and orthostatic vital signs were done and are negative.  Repeat blood count was stable.  She is medically stable for discharge at this time and she was given a bowel regimen and will need outpatient follow up with GI.  She will also follow-up with medical oncology in outpatient setting given that she is now improved and stable for discharge  Assessment and Plan:  Rectal bleeding and hematochezia -Likely in the setting of constipation -FOBT was positive -EDP is already consulted gastroenterology and appreciate their further evaluation -Will need to bowel regimen and monitoring her hemoglobin carefully -GI is recommending hemorrhoidal therapies with hydrocortisone suppositories and treating symptomatically for now and have signed off the case   Hyperkalemia -Likely hemolyzed sample -K+ trend: Recent Labs  Lab 01/07/23 0841 01/13/23 1200 01/14/23 1150 01/14/23 1230 01/14/23 1824 01/15/23 0158  K 4.1 3.6 3.4* 5.2* 3.3* 3.3*  -Monitor and trend and repeat CMP in the a.m.   Constipation -Bowel regimen adjusted by GI -Patient and daughter reports worsens after each chemotherapy treatment -Was initiated on Colace 100 mg twice daily along with MiraLAX at bedtime -Patient has a difficult time getting in enough fluids -Patient also has thiazide diuretic added to her antihypertensive medication and this will be discontinued given her poor oral intake   Near syncopal episode/syncope -Found to have positive orthostatic vital signs and suspected volume depletion secondary to poor p.o. intake as well as diuretic use. -Already given 1 L normal saline bolus and initiating maintenance IV fluids at 75 cc for now -Repeat orthostatic vital signs  in the a.m. and will need PT OT to further evaluate and treat -Therapy signed off the case and she was hydrated and was not orthostatic.   Hyperlipidemia -Continue  Rosuvastatin 20 mg p.o. daily  Hypomagnesemia -Patient's Mag Level Trend: Recent Labs  Lab 01/15/23 0158  MG 1.5*  -Replete with prior to discharge -Continue to Monitor and Replete as Necessary -Repeat Mag in the AM   Diabetes mellitus type 2 -Continue with sensitive NovoLog sliding scale insulin AC -Check hemoglobin A1c in a.m.  Endometrial cancer with chemotherapy related pancytopenia Appreciate oncology input Recent chemotherapy complicated by mucositis, pancytopenia constipation Guards to the pancytopenia oncology documents will not need G-CSF support unless she develops neutropenic fever    Hypertension On olmesartan/HCT combo at home Recommend discontinuing hydrochlorothiazide Supine blood pressures are elevated but patient developed orthostatic hypotension with syncope therefore holding antihypertensive medications for now  Pancytopenia -CBC Trend: Recent Labs  Lab 01/07/23 0841 01/07/23 0841 01/13/23 1200 01/14/23 1150 01/14/23 1230 01/15/23 0158 01/15/23 1456  WBC 2.9*  --  1.9* 1.8*  --  1.9* 1.9*  HGB 10.3*   < > 10.0* 9.8*   < > 8.2* 8.9*  HCT 31.5*  --  31.6* 31.1*   < > 26.5* 27.3*  MCV 86.5  --  88.8 87.1  --  88.9 88.3  PLT 186   < > 172 160  --  129* 124*   < > = values in this interval not displayed.  -Anemia panel done and showed an iron level 135, UIBC 122, TIBC 297, saturation of 59%, B12 725, folate level 10.5, vitamin B12 300 -Has outpatient follow-up with medical oncology and is likely in setting of chemotherapy  Hypoalbuminemia -Patient's Albumin Trend: Recent Labs  Lab 01/07/23 0841 01/13/23 1200 01/15/23 0158  ALBUMIN 3.9 3.5 3.1*  -Continue to Monitor and Trend and repeat CMP in the AM

## 2023-01-17 NOTE — Progress Notes (Signed)
Radiation Oncology         (336) [phone] ________________________________  Name: Alice Hickman MRN: [id number]  Date: 01/18/2023  DOB: 10/29/1950  Vaginal Brachytherapy Procedure Note  CC: Diamantina Providence, FNP Carver Fila, MD    ICD-10-CM   1. Endometrial cancer (HCC)  C54.1 CBC with Differential (Cancer Center Only)    CMP (Cancer Center only)      Diagnosis: The encounter diagnosis was Endometrial cancer (HCC).   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   Since her consultation date of 12/06/22, the patient's case was presented at the gyn-onc tumor board held on 12/13/22. Disposition concluded at that time was to adjuvant treatment with chemotherapy and vaginal brachytherapy. Her2 testing and a referral for genetic testing were also recommended.    Upon further discussion with Dr. Bertis Ruddy, the patient opted to proceed with adjuvant chemotherapy consisting of carboplatin and taxol on 12/17/22. Chemo toxicities encountered by the patient thus far have included: mucositis, pancytopenia, and constipation. In light of her side effects, her most recent cycle on 01/07/23 was administered at a reduced dose.   In the setting of her chemo inducted constipation, the patient presented to the ED on 01/13/23 following a syncopal episode attributed to straining while on the toilet. She also endorsed new onset of abdominal pain and low PO intake. ED work-up included a CT AP which showed not acute findings, and colonic  diverticulosis, without radiographic evidence of diverticulitis. It seems as though she left following her triage evaluation. She however presented to the the ED the following day with the same complaints in addition to rectal bleeding following a strained bowel movement. She was ultimately admitted based on positive FOBT. GI was accordingly consulted and she was started on Colace 100 mg twice daily, MiraLAX, and hydrocortisone suppositories with improvement achieved at discharge on 12/14. She was also given IV fluids for dehydration.        On evaluation today she denies any further rectal bleeding since her hospital admission.  Her constipation issues have cleared up with the use of MiraLAX and Colace.  She denies any vaginal bleeding or hematuria.  ALLERGIES: is allergic to penicillins, lovastatin, and metformin.  Meds: Current Outpatient Medications  Medication Sig Dispense Refill   acetaminophen (TYLENOL) 325 MG tablet Take 2 tablets (650 mg total) by mouth every 6 (six) hours as needed for mild pain (pain score 1-3) (or Fever >/= 101). 20 tablet 0   allopurinol (ZYLOPRIM) 100 MG tablet Take 100 mg by mouth daily.     aspirin 81 MG chewable tablet Chew 81 mg by mouth daily. (Patient not taking: Reported on 01/14/2023)     Colchicine 0.6 MG CAPS Take 0.6 mg by mouth 2 (two) times daily as needed (gout flare pain).     dexamethasone (DECADRON) 4 MG tablet Take 2 tabs at the night before and 2 tab the morning of chemotherapy, every 3 weeks, by mouth x 6 cycles (Patient taking differently: Take 8 mg by mouth See admin instructions. Take 2 tabs at the night before and  2 tab the morning of chemotherapy, every 3 weeks, by mouth x 6 cycles) 24 tablet 6   docusate sodium (COLACE) 100 MG capsule Take 1 capsule (100 mg total) by mouth 2 (two) times daily. 10 capsule 0   ferrous sulfate 325 (65 FE) MG tablet Take 325 mg by mouth daily with breakfast. (Patient not taking: Reported on 01/14/2023)      glimepiride (AMARYL) 2 MG tablet TAKE ONE TABLET BY MOUTH ONCE DAILY BEFORE BREAKFAST (Patient taking differently: Take 2 mg by mouth daily.) 90 tablet 0   hydrocortisone (ANUSOL-HC) 25 MG suppository Place 1 suppository (25 mg total) rectally 2 (two) times daily. 12 suppository 0   latanoprost (XALATAN) 0.005 % ophthalmic solution Place 1 drop into both eyes at bedtime. (Patient not taking: Reported on 01/14/2023)     lidocaine-prilocaine (EMLA) cream Apply to affected area once 30 g 3   magic mouthwash (nystatin, lidocaine, diphenhydrAMINE, alum & mag hydroxide) suspension Swish and spit 5 mls by mouth 4 times a day for 7 days 160 mL 0   olmesartan (BENICAR) 40 MG tablet Take 1 tablet (40 mg total) by mouth daily. 30 tablet 11   ondansetron (ZOFRAN) 8 MG tablet Take 1 tablet (8 mg total) by mouth every 8 (eight) hours as needed for nausea or vomiting. Start on the third day after chemotherapy. 30 tablet 1   polyethylene glycol (MIRALAX / GLYCOLAX) 17 g packet Take 17 g by mouth at bedtime. 14 each 0   prochlorperazine (COMPAZINE) 10 MG tablet Take 1 tablet (10 mg total) by mouth every 6 (six) hours as needed for nausea or vomiting. 30 tablet 1   rosuvastatin (CRESTOR) 20 MG tablet Take 1 tablet (20 mg total) by mouth daily. 90 tablet 3   senna-docusate (SENOKOT-S) 8.6-50 MG tablet Take 2 tablets by mouth at bedtime. For AFTER surgery, do not take if having diarrhea 30 tablet 0   traMADol (ULTRAM) 50 MG tablet Take 1 tablet (50 mg total) by mouth every 6 (six) hours as needed for severe pain (pain score 7-10). 60 tablet 0   No current facility-administered medications for this encounter.    Physical Findings: The patient is in no acute distress. Patient is alert and oriented.  height is 5\' 3"  (1.6 m) and weight is 174 lb 6 oz (79.1 kg). Her temporal temperature is 97.7 F (36.5 C). Her blood pressure is 129/67 and her pulse is 79. Her respiration is 18 and oxygen saturation is 100%.   No  palpable cervical, supraclavicular or axillary lymphoadenopathy. The heart has a regular rate and rhythm. The lungs are clear to auscultation. Abdomen soft and non-tender.  On pelvic examination the external genitalia were unremarkable. A speculum exam was performed. Vaginal cuff intact, no mucosal lesions. On bimanual exam there were no pelvic masses appreciated.  No signs of infection within the vaginal vault.  Lab Findings: Lab Results  Component Value Date   WBC 1.9 (L) 01/15/2023   HGB 8.9 (L) 01/15/2023   HCT 27.3 (L) 01/15/2023   MCV 88.3 01/15/2023   PLT 124 (L) 01/15/2023    Radiographic Findings: CT ABDOMEN PELVIS W CONTRAST Result Date: 01/13/2023 CLINICAL DATA:  Acute abdominal pain. Constipation. High-grade endometrial carcinoma. * Tracking Code: BO * EXAM: CT ABDOMEN AND PELVIS WITH CONTRAST TECHNIQUE: Multidetector CT imaging of the abdomen and pelvis was performed using the standard protocol following bolus administration of intravenous contrast. RADIATION DOSE REDUCTION: This exam was performed according to the departmental dose-optimization program  which includes automated exposure control, adjustment of the mA and/or kV according to patient size and/or use of iterative reconstruction technique. CONTRAST:  OMNIPAQUE IOHEXOL 300 MG/ML  SOLN COMPARISON:  10/28/2022 FINDINGS: Lower Chest: No acute findings. Hepatobiliary: No suspicious hepatic masses identified. Gallbladder is unremarkable. No evidence of biliary ductal dilatation. Pancreas:  No mass or inflammatory changes. Spleen: Within normal limits in size and appearance. Adrenals/Urinary Tract: No suspicious masses identified. No evidence of ureteral calculi or hydronephrosis. Stomach/Bowel: No evidence of obstruction, inflammatory process or abnormal fluid collections. Normal appendix visualized. Diverticulosis is seen mainly involving the descending and sigmoid colon, however there is no evidence of diverticulitis.  Vascular/Lymphatic: No pathologically enlarged lymph nodes. No acute vascular findings. Reproductive: Prior hysterectomy noted. Vaginal cuff and adnexal regions are unremarkable in appearance. No No evidence of mass or ascites. Other:  None. Musculoskeletal:  No suspicious bone lesions identified. IMPRESSION: No acute findings. Colonic diverticulosis, without radiographic evidence of diverticulitis. Prior hysterectomy. No evidence of recurrent or metastatic carcinoma. Electronically Signed   By: Danae Orleans M.D.   On: 01/13/2023 15:11    Impression: The encounter diagnosis was Endometrial cancer (HCC).   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 The patient will be treated with a 2.5 cm diameter cylinder with a treatment length of 3.0 cm. This distended the vaginal vault without undue discomfort. The patient tolerated the procedure well.  The patient was successfully fitted for a vaginal cylinder. The patient is appropriate to begin vaginal brachytherapy.   Plan: The patient will proceed with CT simulation and vaginal brachytherapy today.    _______________________________   Billie Lade, PhD, MD  This document serves as a record of services personally performed by Antony Blackbird, MD. It was created on his behalf by Neena Rhymes, a trained medical scribe. The creation of this record is based on the scribe's personal observations and the provider's statements to them. This document has been checked and approved by the attending provider.

## 2023-01-17 NOTE — Telephone Encounter (Signed)
Called patient to remind of New HDR [facility], spoke with patient's daughter- Summer and she is aware of these appts.

## 2023-01-17 NOTE — Progress Notes (Signed)
  Radiation Oncology         (336) [phone] ________________________________  Name: Alice Hickman MRN: [id number]  Date: 01/18/2023  DOB: 09-27-1950  CC: Diamantina Providence, FNP  Carver Fila, MD  HDR BRACHYTHERAPY NOTE  DIAGNOSIS: The encounter diagnosis was Endometrial cancer [facility]).   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 She will be treated with a 2.5 cm diameter segmented cylinder. This conforms to her anatomy without undue discomfort.  Vaginal brachytherapy procedure node: The patient was brought to the HDR suite. Identity was confirmed. All relevant records and images related to the planned course of therapy were reviewed. The patient freely provided informed written consent to proceed with treatment after reviewing the details related to the planned course of therapy. The consent form was witnessed and verified by the simulation staff. Then, the patient was set-up in a stable reproducible supine position for radiation therapy. Pelvic exam revealed the vaginal cuff to be intact . The patient's custom vaginal cylinder was placed in the proximal vagina. This was affixed to the CT/MR stabilization plate to prevent slippage. Patient tolerated the placement well.  Verification simulation note:  A fiducial marker was placed within the vaginal cylinder. An AP and lateral film was then obtained through the pelvis area. This documented accurate position of the vaginal cylinder for treatment.  HDR  BRACHYTHERAPY TREATMENT  The remote afterloading device was affixed to the vaginal cylinder by catheter. Patient then proceeded to undergo her first high-dose-rate treatment directed at the proximal vagina. The patient was prescribed a dose of 6.0 gray to be delivered to the mucosal surface. Treatment length was 3.0 cm. Patient was treated with 1 channel using 7 dwell positions. Treatment time was 242.8 seconds. Iridium 192 was the high-dose-rate source for treatment. The patient tolerated the treatment well. After completion of her therapy, a radiation survey was performed documenting return of the iridium source into the GammaMed safe.   PLAN: The patient will return later this  week for her second high-dose-rate treatment.  ________________________________  -----------------------------------  Billie Lade, PhD, MD  This document serves as a record of services personally performed by Antony Blackbird, MD. It was created on his behalf by Neena Rhymes, a trained medical scribe. The creation of this record is based on the scribe's personal observations and the provider's statements to them. This document has been checked and approved by the attending provider.

## 2023-01-18 ENCOUNTER — Ambulatory Visit
Admission: RE | Admit: 2023-01-18 | Discharge: 2023-01-18 | Disposition: A | Discharge status: Home / Self Care | Payer: Medicare HMO | Source: Ambulatory Visit | Attending: Radiation Oncology | Admitting: Radiation Oncology

## 2023-01-18 ENCOUNTER — Other Ambulatory Visit: Payer: Self-pay

## 2023-01-18 ENCOUNTER — Encounter: Payer: Self-pay | Admitting: Radiation Oncology

## 2023-01-18 ENCOUNTER — Telehealth: Payer: Self-pay

## 2023-01-18 VITALS — BP 129/67 | HR 79 | Temp 97.7°F | Resp 18 | Ht 63.0 in | Wt 174.4 lb

## 2023-01-18 DIAGNOSIS — Z7982 Long term (current) use of aspirin: Secondary | ICD-10-CM | POA: Insufficient documentation

## 2023-01-18 DIAGNOSIS — K5730 Diverticulosis of large intestine without perforation or abscess without bleeding: Secondary | ICD-10-CM | POA: Diagnosis not present

## 2023-01-18 DIAGNOSIS — C541 Malignant neoplasm of endometrium: Secondary | ICD-10-CM | POA: Insufficient documentation

## 2023-01-18 DIAGNOSIS — Z79899 Other long term (current) drug therapy: Secondary | ICD-10-CM | POA: Diagnosis not present

## 2023-01-18 DIAGNOSIS — K5900 Constipation, unspecified: Secondary | ICD-10-CM | POA: Diagnosis not present

## 2023-01-18 DIAGNOSIS — Z510 Encounter for antineoplastic radiation therapy: Secondary | ICD-10-CM | POA: Insufficient documentation

## 2023-01-18 DIAGNOSIS — Z7984 Long term (current) use of oral hypoglycemic drugs: Secondary | ICD-10-CM | POA: Diagnosis not present

## 2023-01-18 LAB — RAD ONC ARIA SESSION SUMMARY
Course Elapsed Days: 0
Plan Fractions Treated to Date: 1
Plan Prescribed Dose Per Fraction: 6 Gy
Plan Total Fractions Prescribed: 5
Plan Total Prescribed Dose: 30 Gy
Reference Point Dosage Given to Date: 6 Gy
Reference Point Session Dosage Given: 6 Gy
Session Number: 1

## 2023-01-18 LAB — CBC WITH DIFFERENTIAL (CANCER CENTER ONLY)
Abs Immature Granulocytes: 0 K/uL (ref 0.00–0.07)
Basophils Absolute: 0 K/uL (ref 0.0–0.1)
Basophils Relative: 0 %
Eosinophils Absolute: 0 K/uL (ref 0.0–0.5)
Eosinophils Relative: 2 %
HCT: 26.8 % — ABNORMAL LOW (ref 36.0–46.0)
Hemoglobin: 8.8 g/dL — ABNORMAL LOW (ref 12.0–15.0)
Immature Granulocytes: 0 %
Lymphocytes Relative: 74 %
Lymphs Abs: 1.5 K/uL (ref 0.7–4.0)
MCH: 28.2 pg (ref 26.0–34.0)
MCHC: 32.8 g/dL (ref 30.0–36.0)
MCV: 85.9 fL (ref 80.0–100.0)
Monocytes Absolute: 0.3 K/uL (ref 0.1–1.0)
Monocytes Relative: 15 %
Neutro Abs: 0.2 K/uL — CL (ref 1.7–7.7)
Neutrophils Relative %: 9 %
Platelet Count: 102 K/uL — ABNORMAL LOW (ref 150–400)
RBC: 3.12 MIL/uL — ABNORMAL LOW (ref 3.87–5.11)
RDW: 13.6 % (ref 11.5–15.5)
WBC Count: 2.1 K/uL — ABNORMAL LOW (ref 4.0–10.5)
nRBC: 0 % (ref 0.0–0.2)

## 2023-01-18 LAB — CMP (CANCER CENTER ONLY)
ALT: 25 U/L (ref 0–44)
AST: 17 U/L (ref 15–41)
Albumin: 3.6 g/dL (ref 3.5–5.0)
Alkaline Phosphatase: 45 U/L (ref 38–126)
Anion gap: 5 (ref 5–15)
BUN: 13 mg/dL (ref 8–23)
CO2: 28 mmol/L (ref 22–32)
Calcium: 9.3 mg/dL (ref 8.9–10.3)
Chloride: 110 mmol/L (ref 98–111)
Creatinine: 0.86 mg/dL (ref 0.44–1.00)
GFR, Estimated: >60 mL/min (ref >60)
Glucose, Bld: 77 mg/dL (ref 70–99)
Potassium: 4 mmol/L (ref 3.5–5.1)
Sodium: 143 mmol/L (ref 135–145)
Total Bilirubin: 0.3 mg/dL (ref <1.2)
Total Protein: 6.1 g/dL — ABNORMAL LOW (ref 6.5–8.1)

## 2023-01-18 NOTE — Progress Notes (Signed)
Alice Hickman is here today for follow up new vaginal cylinder fitting.    Does the patient complain of any of the following:  Pain:No Abdominal bloating: No Diarrhea/Constipation: Constipation, patient now taking colace and Miralax daily. Nausea/Vomiting: No Vaginal Discharge: No Blood in Urine or Stool: Blood in stool due to hemorrhoids. Urinary Issues (dysuria/incomplete emptying/ incontinence/ increased frequency/urgency): No  Additional comments if applicable:   BP 129/67 (BP Location: Left Arm, Patient Position: Sitting)   Pulse 79   Temp 97.7 F (36.5 C) (Temporal)   Resp 18   Ht 5\' 3"  (1.6 m)   Wt 174 lb 6 oz (79.1 kg)   SpO2 100%   BMI 30.89 kg/m

## 2023-01-18 NOTE — Telephone Encounter (Signed)
Alice Hickman from lab called with a critical lab. ANC was 0.2. Dr. Roselind Messier was made aware.

## 2023-01-18 NOTE — Telephone Encounter (Signed)
Called and made patients daughter Jacquilyn Benoit aware of ANC results and was advised to wear mask for treatment today.

## 2023-01-19 ENCOUNTER — Telehealth: Payer: Self-pay | Admitting: *Deleted

## 2023-01-19 NOTE — Progress Notes (Signed)
  Radiation Oncology         [phone] ________________________________  Name: Alice Hickman MRN: [id number]  Date: 01/20/2023  DOB: Apr 10, 1950  CC: Diamantina Providence, FNP  Carver Fila, MD  HDR BRACHYTHERAPY NOTE  DIAGNOSIS: The encounter diagnosis was Endometrial cancer [facility]).   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 She will be treated with a 2.5 cm diameter segmented cylinder. This conforms to her anatomy without undue discomfort.  Vaginal brachytherapy procedure node: The patient was brought to the HDR suite. Identity was confirmed. All relevant records and images related to the planned course of therapy were reviewed. The patient freely provided informed written consent to proceed with treatment after reviewing the details related to the planned course of therapy. The consent form was witnessed and verified by the simulation staff. Then, the patient was set-up in a stable reproducible supine position for radiation therapy. Pelvic exam revealed the vaginal cuff to be intact . The patient's custom vaginal cylinder was placed in the proximal vagina. This was affixed to the CT/MR stabilization plate to prevent slippage. Patient tolerated the placement well.  Verification simulation note:  A fiducial marker was placed within the vaginal cylinder. An AP and lateral film was then obtained through the pelvis area. This documented accurate position of the vaginal cylinder for treatment.  HDR  BRACHYTHERAPY TREATMENT  The remote afterloading device was affixed to the vaginal cylinder by catheter. Patient then proceeded to undergo her second high-dose-rate treatment directed at the proximal vagina. The patient was prescribed a dose of 6.0 gray to be delivered to the mucosal surface. Treatment length was 3.0 cm. Patient was treated with 1 channel using 7 dwell positions. Treatment time was 247.3 seconds. Iridium 192 was the high-dose-rate source for treatment. The patient tolerated the treatment well. After completion of her therapy, a radiation survey was performed documenting return of the iridium source into the GammaMed safe.   PLAN: The patient will return in several days for her third high-dose-rate treatment.  ________________________________  -----------------------------------  Billie Lade, PhD, MD  This document serves as a record of services personally performed by Antony Blackbird, MD. It was created on his behalf by Neena Rhymes, a trained medical scribe. The creation of this record is based on the scribe's personal observations and the provider's statements to them. This document has been checked and approved by the attending provider.

## 2023-01-19 NOTE — Telephone Encounter (Signed)
CALLED PATIENT TO REMIND OF HDR TX. FOR 01-20-23 @ 2 PM, SPOKE WITH PATIENT'S DAUGHTER- SUMMER AND SHE IS AWARE OF THIS APPT.

## 2023-01-20 ENCOUNTER — Ambulatory Visit
Admission: RE | Admit: 2023-01-20 | Discharge: 2023-01-20 | Disposition: A | Discharge status: Home / Self Care | Payer: Medicare HMO | Source: Ambulatory Visit | Attending: Radiation Oncology | Admitting: Radiation Oncology

## 2023-01-20 ENCOUNTER — Other Ambulatory Visit: Payer: Self-pay

## 2023-01-20 DIAGNOSIS — Z510 Encounter for antineoplastic radiation therapy: Secondary | ICD-10-CM | POA: Diagnosis not present

## 2023-01-20 DIAGNOSIS — C541 Malignant neoplasm of endometrium: Secondary | ICD-10-CM

## 2023-01-20 LAB — RAD ONC ARIA SESSION SUMMARY
Course Elapsed Days: 2
Plan Fractions Treated to Date: 2
Plan Prescribed Dose Per Fraction: 6 Gy
Plan Total Fractions Prescribed: 5
Plan Total Prescribed Dose: 30 Gy
Reference Point Dosage Given to Date: 12 Gy
Reference Point Session Dosage Given: 6 Gy
Session Number: 2

## 2023-01-28 ENCOUNTER — Telehealth: Payer: Self-pay | Admitting: *Deleted

## 2023-01-28 NOTE — Telephone Encounter (Signed)
CALLED PATIENT TO REMIND OF HDR TX. FOR 01-31-23 @ 3 PM, SPOKE WITH PATIENT'S DAUGHTER- SUMMER AND SHE IS AWARE OF THIS TX.

## 2023-01-31 ENCOUNTER — Other Ambulatory Visit: Payer: Self-pay

## 2023-01-31 ENCOUNTER — Ambulatory Visit
Admission: RE | Admit: 2023-01-31 | Discharge: 2023-01-31 | Disposition: A | Discharge status: Home / Self Care | Payer: Medicare HMO | Source: Ambulatory Visit | Attending: Radiation Oncology | Admitting: Radiation Oncology

## 2023-01-31 DIAGNOSIS — Z510 Encounter for antineoplastic radiation therapy: Secondary | ICD-10-CM | POA: Diagnosis not present

## 2023-01-31 DIAGNOSIS — C541 Malignant neoplasm of endometrium: Secondary | ICD-10-CM

## 2023-01-31 LAB — RAD ONC ARIA SESSION SUMMARY
Course Elapsed Days: 13
Plan Fractions Treated to Date: 3
Plan Prescribed Dose Per Fraction: 6 Gy
Plan Total Fractions Prescribed: 5
Plan Total Prescribed Dose: 30 Gy
Reference Point Dosage Given to Date: 18 Gy
Reference Point Session Dosage Given: 6 Gy
Session Number: 3

## 2023-01-31 NOTE — Progress Notes (Signed)
  Radiation Oncology         [phone] ________________________________  Name: Alice Hickman MRN: [id number]  Date: 01/31/2023  DOB: [birth year]  CC: Diamantina Providence, FNP  Carver Fila, MD  HDR BRACHYTHERAPY NOTE  DIAGNOSIS: The encounter diagnosis was Endometrial cancer [facility]).   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 She will be treated with a 2.5 cm diameter segmented cylinder. This conforms to her anatomy without undue discomfort.  Vaginal brachytherapy procedure node: The patient was brought to the HDR suite. Identity was confirmed. All relevant records and images related to the planned course of therapy were reviewed. The patient freely provided informed written consent to proceed with treatment after reviewing the details related to the planned course of therapy. The consent form was witnessed and verified by the simulation staff. Then, the patient was set-up in a stable reproducible supine position for radiation therapy. Pelvic exam revealed the vaginal cuff to be intact . The patient's custom vaginal cylinder was placed in the proximal vagina. This was affixed to the CT/MR stabilization plate to prevent slippage. Patient tolerated the placement well.  Verification simulation note:  A fiducial marker was placed within the vaginal cylinder. An AP and lateral film was then obtained through the pelvis area. This documented accurate position of the vaginal cylinder for treatment.  HDR  BRACHYTHERAPY TREATMENT  The remote afterloading device was affixed to the vaginal cylinder by catheter. Patient then proceeded to undergo her third high-dose-rate treatment directed at the proximal vagina. The patient was prescribed a dose of 6.0 gray to be delivered to the mucosal surface. Treatment length was 3.0 cm. Patient was treated with 1 channel using 7 dwell positions. Treatment time was 274.3 seconds. Iridium 192 was the high-dose-rate source for treatment. The patient tolerated the treatment well. After completion of her therapy, a radiation survey was performed documenting return of the iridium source into the GammaMed safe.   PLAN: The patient will return on 02/06/22 for her fourth high-dose-rate treatment.  ________________________________  -----------------------------------  Billie Lade, PhD, MD  This document serves as a record of services personally performed by Antony Blackbird, MD. It was created on his behalf by Neena Rhymes, a trained medical scribe. The creation of this record is based on the scribe's personal observations and the provider's statements to them. This document has been checked and approved by the attending provider.

## 2023-02-01 ENCOUNTER — Telehealth: Payer: Self-pay | Admitting: *Deleted

## 2023-02-01 NOTE — Telephone Encounter (Signed)
Called patient to remind of HDR Tx. for 02-03-23 @ 11 am, spoke with patient's daughter Summer and she is aware of this tx.

## 2023-02-02 NOTE — Progress Notes (Signed)
  Radiation Oncology         [phone] ________________________________  Name: Alice Hickman MRN: [id number]  Date: 02/03/2023  DOB: 1950/08/07  CC: [name], FNP  [name], MD  HDR BRACHYTHERAPY NOTE  DIAGNOSIS: The encounter diagnosis was Endometrial cancer [facility]).   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 She will be treated with a 2.5 cm diameter segmented cylinder. This conforms to her anatomy without undue discomfort.  Vaginal brachytherapy procedure node: The patient was brought to the HDR suite. Identity was confirmed. All relevant records and images related to the planned course of therapy were reviewed. The patient freely provided informed written consent to proceed with treatment after reviewing the details related to the planned course of therapy. The consent form was witnessed and verified by the simulation staff. Then, the patient was set-up in a stable reproducible supine position for radiation therapy. Pelvic exam revealed the vaginal cuff to be intact . The patient's custom vaginal cylinder was placed in the proximal vagina. This was affixed to the CT/MR stabilization plate to prevent slippage. Patient tolerated the placement well.  Verification simulation note:  A fiducial marker was placed within the vaginal cylinder. An AP and lateral film was then obtained through the pelvis area. This documented accurate position of the vaginal cylinder for treatment.  HDR  BRACHYTHERAPY TREATMENT  The remote afterloading device was affixed to the vaginal cylinder by catheter. Patient then proceeded to undergo her fourth high-dose-rate treatment directed at the proximal vagina. The patient was prescribed a dose of 6.0 gray to be delivered to the mucosal surface. Treatment length was 3.0 cm. Patient was treated with 1 channel using 7 dwell positions. Treatment time was 282.0 seconds. Iridium 192 was the high-dose-rate source for treatment. The patient tolerated the treatment well. After completion of her therapy, a radiation survey was performed documenting return of the iridium source into the GammaMed safe.   PLAN: The patient will return on 02/06/22 for her fifth high-dose-rate treatment.  ________________________________  -----------------------------------  Lynwood CHARM Nasuti, PhD, MD  This document serves as a record of services personally performed by Lynwood Nasuti, MD. It was created on his behalf by Dorthy Fuse, a trained medical scribe. The creation of this record is based on the scribe's personal observations and the provider's statements to them. This document has been checked and approved by the attending provider.

## 2023-02-03 ENCOUNTER — Other Ambulatory Visit: Payer: Self-pay

## 2023-02-03 ENCOUNTER — Encounter: Payer: Self-pay | Admitting: Hematology and Oncology

## 2023-02-03 ENCOUNTER — Inpatient Hospital Stay (HOSPITAL_BASED_OUTPATIENT_CLINIC_OR_DEPARTMENT_OTHER): Payer: Medicare HMO | Admitting: Hematology and Oncology

## 2023-02-03 ENCOUNTER — Ambulatory Visit
Admission: RE | Admit: 2023-02-03 | Discharge: 2023-02-03 | Disposition: A | Discharge status: Home / Self Care | Payer: Medicare HMO | Source: Ambulatory Visit | Attending: Radiation Oncology | Admitting: Radiation Oncology

## 2023-02-03 ENCOUNTER — Inpatient Hospital Stay: Payer: Medicare HMO

## 2023-02-03 VITALS — BP 137/67 | HR 80 | Temp 98.0°F | Resp 16 | Ht 63.0 in | Wt 169.6 lb

## 2023-02-03 DIAGNOSIS — Z79899 Other long term (current) drug therapy: Secondary | ICD-10-CM | POA: Insufficient documentation

## 2023-02-03 DIAGNOSIS — K5909 Other constipation: Secondary | ICD-10-CM

## 2023-02-03 DIAGNOSIS — D61818 Other pancytopenia: Secondary | ICD-10-CM | POA: Insufficient documentation

## 2023-02-03 DIAGNOSIS — C541 Malignant neoplasm of endometrium: Secondary | ICD-10-CM

## 2023-02-03 DIAGNOSIS — Z5111 Encounter for antineoplastic chemotherapy: Secondary | ICD-10-CM | POA: Insufficient documentation

## 2023-02-03 LAB — CBC WITH DIFFERENTIAL (CANCER CENTER ONLY)
Abs Immature Granulocytes: 0.02 10*3/uL (ref 0.00–0.07)
Basophils Absolute: 0 10*3/uL (ref 0.0–0.1)
Basophils Relative: 0 %
Eosinophils Absolute: 0.1 10*3/uL (ref 0.0–0.5)
Eosinophils Relative: 4 %
HCT: 25.4 % — ABNORMAL LOW (ref 36.0–46.0)
Hemoglobin: 8.2 g/dL — ABNORMAL LOW (ref 12.0–15.0)
Immature Granulocytes: 1 %
Lymphocytes Relative: 36 %
Lymphs Abs: 1.1 10*3/uL (ref 0.7–4.0)
MCH: 28.4 pg (ref 26.0–34.0)
MCHC: 32.3 g/dL (ref 30.0–36.0)
MCV: 87.9 fL (ref 80.0–100.0)
Monocytes Absolute: 0.3 10*3/uL (ref 0.1–1.0)
Monocytes Relative: 10 %
Neutro Abs: 1.5 10*3/uL — ABNORMAL LOW (ref 1.7–7.7)
Neutrophils Relative %: 49 %
Platelet Count: 198 10*3/uL (ref 150–400)
RBC: 2.89 MIL/uL — ABNORMAL LOW (ref 3.87–5.11)
RDW: 16.8 % — ABNORMAL HIGH (ref 11.5–15.5)
WBC Count: 3 10*3/uL — ABNORMAL LOW (ref 4.0–10.5)
nRBC: 0 % (ref 0.0–0.2)

## 2023-02-03 LAB — CMP (CANCER CENTER ONLY)
ALT: 12 U/L (ref 0–44)
AST: 17 U/L (ref 15–41)
Albumin: 3.6 g/dL (ref 3.5–5.0)
Alkaline Phosphatase: 48 U/L (ref 38–126)
Anion gap: 3 — ABNORMAL LOW (ref 5–15)
BUN: 12 mg/dL (ref 8–23)
CO2: 28 mmol/L (ref 22–32)
Calcium: 9.3 mg/dL (ref 8.9–10.3)
Chloride: 113 mmol/L — ABNORMAL HIGH (ref 98–111)
Creatinine: 0.95 mg/dL (ref 0.44–1.00)
GFR, Estimated: >60 mL/min (ref >60)
Glucose, Bld: 75 mg/dL (ref 70–99)
Potassium: 3.7 mmol/L (ref 3.5–5.1)
Sodium: 144 mmol/L (ref 135–145)
Total Bilirubin: 0.4 mg/dL (ref 0.0–1.2)
Total Protein: 6.5 g/dL (ref 6.5–8.1)

## 2023-02-03 LAB — RAD ONC ARIA SESSION SUMMARY
Course Elapsed Days: 16
Plan Fractions Treated to Date: 4
Plan Prescribed Dose Per Fraction: 6 Gy
Plan Total Fractions Prescribed: 5
Plan Total Prescribed Dose: 30 Gy
Reference Point Dosage Given to Date: 24 Gy
Reference Point Session Dosage Given: 6 Gy
Session Number: 4

## 2023-02-03 MED ORDER — SODIUM CHLORIDE 0.9% FLUSH
10.0000 mL | Freq: Once | INTRAVENOUS | Status: AC
  Administered 2023-02-03: 10 mL

## 2023-02-03 MED ORDER — HEPARIN SOD (PORK) LOCK FLUSH 100 UNIT/ML IV SOLN
500.0000 [IU] | Freq: Once | INTRAVENOUS | Status: AC
  Administered 2023-02-03: 500 [IU]

## 2023-02-03 MED FILL — Fosaprepitant Dimeglumine For IV Infusion 150 MG (Base Eq): INTRAVENOUS | Qty: 5 | Status: AC

## 2023-02-03 NOTE — Assessment & Plan Note (Signed)
 She has experienced multiple side effects including mucositis, worsening pancytopenia, constipation etc. including admission to the hospital with recent chemotherapy Plan minor dose reduction due to significant side effects Will continue aggressive supportive care

## 2023-02-03 NOTE — Assessment & Plan Note (Signed)
 I warned her about risk of constipation while on treatment She will continue to take aggressive laxatives

## 2023-02-03 NOTE — Progress Notes (Signed)
 [facility] OFFICE PROGRESS NOTE  Patient Care Team: [name], FNP as PCP - General (Nurse Practitioner)  ASSESSMENT & PLAN:  Endometrial cancer [facility]) She has experienced multiple side effects including mucositis, worsening pancytopenia, constipation etc. including admission to the hospital with recent chemotherapy Plan minor dose reduction due to significant side effects Will continue aggressive supportive care  Chronic constipation I warned her about risk of constipation while on treatment She will continue to take aggressive laxatives  Pancytopenia, acquired (HCC) She had recent severe pancytopenia She had vitamin levels checked and they were adequate I suspect the cause of her severe pancytopenia is due to side effects of treatment I plan further dose reduction She does not need transfusion support  No orders of the defined types were placed in this encounter.   All questions were answered. The patient knows to call the clinic with any problems, questions or concerns. The total time spent in the appointment was 40 minutes encounter with patients including review of chart and various tests results, discussions about plan of care and coordination of care plan   Almarie Bedford, MD 02/03/2023 3:22 PM  INTERVAL HISTORY: Please see below for problem oriented charting. she returns for chemotherapy follow-up She was hospitalized with last treatment and had significant side effects Her constipation has resolved.  She denies mucositis.  No worsening peripheral neuropathy.  No recent bleeding. We discussed test results and plan for dose reduction  REVIEW OF SYSTEMS:   Constitutional: Denies fevers, chills or abnormal weight loss Eyes: Denies blurriness of vision Respiratory: Denies cough, dyspnea or wheezes Cardiovascular: Denies palpitation, chest discomfort or lower extremity swelling Skin: Denies abnormal skin rashes Lymphatics: Denies new lymphadenopathy or  easy bruising Neurological:Denies numbness, tingling or new weaknesses Behavioral/Psych: Mood is stable, no new changes  All other systems were reviewed with the patient and are negative.  I have reviewed the past medical history, past surgical history, social history and family history with the patient and they are unchanged from previous note.  ALLERGIES:  is allergic to penicillins, lovastatin, and metformin.  MEDICATIONS:  Current Outpatient Medications  Medication Sig Dispense Refill   acetaminophen  (TYLENOL ) 325 MG tablet Take 2 tablets (650 mg total) by mouth every 6 (six) hours as needed for mild pain (pain score 1-3) (or Fever >/= 101). 20 tablet 0   allopurinol  (ZYLOPRIM ) 100 MG tablet Take 100 mg by mouth daily.     aspirin  81 MG chewable tablet Chew 81 mg by mouth daily. (Patient not taking: Reported on 01/14/2023)     Colchicine  0.6 MG CAPS Take 0.6 mg by mouth 2 (two) times daily as needed (gout flare pain).     dexamethasone  (DECADRON ) 4 MG tablet Take 2 tabs at the night before and 2 tab the morning of chemotherapy, every 3 weeks, by mouth x 6 cycles (Patient taking differently: Take 8 mg by mouth See admin instructions. Take 2 tabs at the night before and 2 tab the morning of chemotherapy, every 3 weeks, by mouth x 6 cycles) 24 tablet 6   docusate sodium  (COLACE) 100 MG capsule Take 1 capsule (100 mg total) by mouth 2 (two) times daily. 10 capsule 0   glimepiride  (AMARYL ) 2 MG tablet TAKE ONE TABLET BY MOUTH ONCE DAILY BEFORE BREAKFAST (Patient taking differently: Take 2 mg by mouth daily.) 90 tablet 0   hydrocortisone  (ANUSOL -HC) 25 MG suppository Place 1 suppository (25 mg total) rectally 2 (two) times daily. 12 suppository 0   latanoprost (  XALATAN) 0.005 % ophthalmic solution Place 1 drop into both eyes at bedtime. (Patient not taking: Reported on 01/14/2023)     lidocaine -prilocaine  (EMLA ) cream Apply to affected area once 30 g 3   magic mouthwash (nystatin , lidocaine ,  diphenhydrAMINE, alum & mag hydroxide) suspension Swish and spit 5 mls by mouth 4 times a day for 7 days 160 mL 0   olmesartan  (BENICAR ) 40 MG tablet Take 1 tablet (40 mg total) by mouth daily. 30 tablet 11   ondansetron  (ZOFRAN ) 8 MG tablet Take 1 tablet (8 mg total) by mouth every 8 (eight) hours as needed for nausea or vomiting. Start on the third day after chemotherapy. 30 tablet 1   polyethylene glycol (MIRALAX  / GLYCOLAX ) 17 g packet Take 17 g by mouth at bedtime. 14 each 0   prochlorperazine  (COMPAZINE ) 10 MG tablet Take 1 tablet (10 mg total) by mouth every 6 (six) hours as needed for nausea or vomiting. 30 tablet 1   rosuvastatin  (CRESTOR ) 20 MG tablet Take 1 tablet (20 mg total) by mouth daily. 90 tablet 3   senna-docusate (SENOKOT-S) 8.6-50 MG tablet Take 2 tablets by mouth at bedtime. For AFTER surgery, do not take if having diarrhea 30 tablet 0   traMADol  (ULTRAM ) 50 MG tablet Take 1 tablet (50 mg total) by mouth every 6 (six) hours as needed for severe pain (pain score 7-10). 60 tablet 0   No current facility-administered medications for this visit.    SUMMARY OF ONCOLOGIC HISTORY: Oncology History Overview Note  High grade serous, MMR normal, 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    10/28/2022 Imaging   CT C/A/P: 1. Abnormally thickened endometrium (27 mm thickness), compatible with known primary endometrial malignancy. 2. No evidence of metastatic disease in the chest, abdomen or pelvis. 3. Moderate diffuse colonic diverticulosis. 4. One vessel coronary atherosclerosis. 5.  Aortic Atherosclerosis (ICD10-I70.0).   11/18/2022 Initial Diagnosis   Endometrial cancer (HCC)   11/18/2022 Surgery   Robotic-assisted laparoscopic total hysterectomy with bilateral salpingoophorectomy, SLN biopsy bilaterally, omentectomy, cystoscopy, vaginal repair   Findings: On  EUA, enlarged mobile uterus. On intra-abdominal entry, normal upper abodminal survey. Normal omentum, small and large bowel. Uterus 12 cm with 4-5 cm calcified anterior fundal fibroid, mildly bulbous. Normal apeparing bilateral adnexa. Mapping successful to bilateral obturator SLNs, no obvious adenopathy. Right sulcal and right sidewall vaginal lacerations due to vaginal delivery of specimen, repaired. On cystoscopy, intact dome, good efflux from bilateral ureteral orifices.    11/25/2022 Cancer Staging   Staging form: Corpus Uteri - Carcinoma and Carcinosarcoma, AJCC 8th Edition - Pathologic stage from 11/25/2022: FIGO Stage II, calculated as Stage IB (pT1b, pN0, cM0) - Signed by [name], MD on 11/25/2022 Stage prefix: Initial diagnosis   12/10/2022 Procedure   Successful placement of a right IJ approach Power Port with ultrasound and fluoroscopic guidance. The catheter is ready for use.   12/17/2022 -  Chemotherapy   Patient is on Treatment Plan : UTERINE Carboplatin  AUC 6 + Paclitaxel  q21d       PHYSICAL EXAMINATION: ECOG PERFORMANCE STATUS: 1 - Symptomatic but completely ambulatory  Vitals:   02/03/23 1308  BP: 137/67  Pulse: 80  Resp: 16  Temp: 98 F (36.7 C)  SpO2: 100%   Filed Weights   02/03/23 1308  Weight: 169 lb 9.6 oz (76.9 kg)    GENERAL:alert, no distress and comfortable  LABORATORY DATA:  I have reviewed the data as listed    Component Value Date/Time   NA 144 02/03/2023 1247   NA 137 12/14/2019 0908   K 3.7 02/03/2023 1247   CL 113 (H) 02/03/2023 1247   CO2 28 02/03/2023 1247   GLUCOSE 75 02/03/2023 1247   BUN 12 02/03/2023 1247   BUN 25 12/14/2019 0908   CREATININE 0.95 02/03/2023 1247   CALCIUM  9.3 02/03/2023 1247   PROT 6.5 02/03/2023 1247   ALBUMIN 3.6 02/03/2023 1247   AST 17 02/03/2023 1247   ALT 12 02/03/2023 1247   ALKPHOS 48 02/03/2023 1247   BILITOT 0.4 02/03/2023 1247   GFRNONAA >60 02/03/2023 1247   GFRAA 52 (L) 12/14/2019 0908     No results found for: SPEP, UPEP  Lab Results  Component Value Date   WBC 3.0 (L) 02/03/2023   NEUTROABS 1.5 (L) 02/03/2023   HGB 8.2 (L) 02/03/2023   HCT 25.4 (L) 02/03/2023   MCV 87.9 02/03/2023   PLT 198 02/03/2023      Chemistry      Component Value Date/Time   NA 144 02/03/2023 1247   NA 137 12/14/2019 0908   K 3.7 02/03/2023 1247   CL 113 (H) 02/03/2023 1247   CO2 28 02/03/2023 1247   BUN 12 02/03/2023 1247   BUN 25 12/14/2019 0908   CREATININE 0.95 02/03/2023 1247      Component Value Date/Time   CALCIUM  9.3 02/03/2023 1247   ALKPHOS 48 02/03/2023 1247   AST 17 02/03/2023 1247   ALT 12 02/03/2023 1247   BILITOT 0.4 02/03/2023 1247       RADIOGRAPHIC STUDIES: I have personally reviewed the radiological images as listed and agreed with the findings in the report. CT ABDOMEN PELVIS W CONTRAST Result Date: 01/13/2023 CLINICAL DATA:  Acute abdominal pain. Constipation. High-grade endometrial carcinoma. * Tracking Code: BO * EXAM: CT ABDOMEN AND PELVIS WITH CONTRAST TECHNIQUE: Multidetector CT imaging of the abdomen and pelvis was performed using the standard protocol following bolus administration of intravenous contrast. RADIATION DOSE REDUCTION: This exam was performed according to the departmental dose-optimization program which includes automated exposure control, adjustment of the mA and/or kV according to patient size and/or use of iterative reconstruction technique. CONTRAST:  OMNIPAQUE  IOHEXOL  300 MG/ML  SOLN COMPARISON:  10/28/2022 FINDINGS: Lower Chest: No acute findings. Hepatobiliary: No suspicious hepatic masses identified. Gallbladder is unremarkable. No evidence of biliary ductal dilatation. Pancreas:  No mass or inflammatory changes. Spleen: Within normal limits in size and appearance. Adrenals/Urinary Tract: No suspicious masses identified. No evidence of ureteral calculi or hydronephrosis. Stomach/Bowel: No evidence of obstruction,  inflammatory process or abnormal fluid collections. Normal appendix visualized. Diverticulosis is seen mainly involving the descending and sigmoid colon, however there is no evidence of diverticulitis. Vascular/Lymphatic: No pathologically enlarged lymph nodes. No acute vascular findings. Reproductive: Prior hysterectomy noted. Vaginal cuff and adnexal regions are unremarkable in appearance. No No evidence of mass or ascites. Other:  None. Musculoskeletal:  No suspicious bone lesions identified. IMPRESSION: No acute findings. Colonic diverticulosis, without radiographic evidence of diverticulitis. Prior hysterectomy. No evidence of recurrent or metastatic carcinoma. Electronically Signed   By: Norleen DELENA Kil M.D.   On: 01/13/2023 15:11

## 2023-02-03 NOTE — Assessment & Plan Note (Signed)
 She had recent severe pancytopenia She had vitamin levels checked and they were adequate I suspect the cause of her severe pancytopenia is due to side effects of treatment I plan further dose reduction She does not need transfusion support

## 2023-02-04 ENCOUNTER — Telehealth: Payer: Self-pay | Admitting: *Deleted

## 2023-02-04 ENCOUNTER — Ambulatory Visit: Payer: Medicare HMO | Admitting: Hematology and Oncology

## 2023-02-04 ENCOUNTER — Encounter: Payer: Self-pay | Admitting: Hematology and Oncology

## 2023-02-04 ENCOUNTER — Other Ambulatory Visit: Payer: Medicare HMO

## 2023-02-04 ENCOUNTER — Inpatient Hospital Stay: Payer: Medicare HMO

## 2023-02-04 ENCOUNTER — Other Ambulatory Visit: Payer: Self-pay | Admitting: Hematology and Oncology

## 2023-02-04 VITALS — BP 120/56 | HR 74 | Temp 98.3°F | Resp 16

## 2023-02-04 DIAGNOSIS — C541 Malignant neoplasm of endometrium: Secondary | ICD-10-CM | POA: Diagnosis not present

## 2023-02-04 MED ORDER — PALONOSETRON HCL INJECTION 0.25 MG/5ML
0.2500 mg | Freq: Once | INTRAVENOUS | Status: AC
  Administered 2023-02-04: 0.25 mg via INTRAVENOUS
  Filled 2023-02-04: qty 5

## 2023-02-04 MED ORDER — HEPARIN SOD (PORK) LOCK FLUSH 100 UNIT/ML IV SOLN
500.0000 [IU] | Freq: Once | INTRAVENOUS | Status: AC | PRN
  Administered 2023-02-04: 500 [IU]

## 2023-02-04 MED ORDER — SODIUM CHLORIDE 0.9 % IV SOLN
150.0000 mg | Freq: Once | INTRAVENOUS | Status: AC
  Administered 2023-02-04: 150 mg via INTRAVENOUS
  Filled 2023-02-04: qty 150

## 2023-02-04 MED ORDER — SODIUM CHLORIDE 0.9 % IV SOLN: INTRAVENOUS | Status: DC

## 2023-02-04 MED ORDER — FAMOTIDINE IN NACL 20-0.9 MG/50ML-% IV SOLN
20.0000 mg | Freq: Once | INTRAVENOUS | Status: AC
  Administered 2023-02-04: 20 mg via INTRAVENOUS
  Filled 2023-02-04: qty 50

## 2023-02-04 MED ORDER — SODIUM CHLORIDE 0.9 % IV SOLN
140.0000 mg/m2 | Freq: Once | INTRAVENOUS | Status: AC
  Administered 2023-02-04: 252 mg via INTRAVENOUS
  Filled 2023-02-04: qty 42

## 2023-02-04 MED ORDER — CETIRIZINE HCL 10 MG/ML IV SOLN
10.0000 mg | Freq: Once | INTRAVENOUS | Status: AC
  Administered 2023-02-04: 10 mg via INTRAVENOUS
  Filled 2023-02-04: qty 1

## 2023-02-04 MED ORDER — CARBOPLATIN CHEMO INJECTION 450 MG/45ML
350.0000 mg | Freq: Once | INTRAVENOUS | Status: AC
  Administered 2023-02-04: 350 mg via INTRAVENOUS
  Filled 2023-02-04: qty 35

## 2023-02-04 MED ORDER — DEXAMETHASONE SODIUM PHOSPHATE 10 MG/ML IJ SOLN
10.0000 mg | Freq: Once | INTRAMUSCULAR | Status: AC
  Administered 2023-02-04: 10 mg via INTRAVENOUS
  Filled 2023-02-04: qty 1

## 2023-02-04 MED ORDER — SODIUM CHLORIDE 0.9% FLUSH
10.0000 mL | INTRAVENOUS | Status: DC | PRN
Start: 2023-02-04 — End: 2023-02-04
  Administered 2023-02-04: 10 mL

## 2023-02-04 NOTE — Progress Notes (Signed)
 Change carbo dose to 350mg  for this cycle per Dr. Bertis Ruddy.

## 2023-02-04 NOTE — Telephone Encounter (Signed)
 CALLED PATIENT TO REMIND OF HDR TX. FOR 02-07-23 @ 2 PM, SPOKE WITH PATIENT'S DAUGHTER- SUMMER AND SHE IS AWARE OF THIS TX.

## 2023-02-04 NOTE — Patient Instructions (Signed)
 [facility] - A DEPT OF [facility]  Discharge Instructions: Thank you for choosing [facility] to provide your oncology and hematology care.   If you have a lab appointment with the Cancer Center, please go directly to the Cancer Center and check in at the registration area.   Wear comfortable clothing and clothing appropriate for easy access to any Portacath or PICC line.   We strive to give you quality time with your provider. You may need to reschedule your appointment if you arrive late (15 or more minutes).  Arriving late affects you and other patients whose appointments are after yours.  Also, if you miss three or more appointments without notifying the office, you may be dismissed from the clinic at the provider's discretion.      For prescription refill requests, have your pharmacy contact our office and allow 72 hours for refills to be completed.    Today you received the following chemotherapy and/or immunotherapy agents Taxol, Carboplatin      To help prevent nausea and vomiting after your treatment, we encourage you to take your nausea medication as directed.  BELOW ARE SYMPTOMS THAT SHOULD BE REPORTED IMMEDIATELY: *FEVER GREATER THAN 100.4 F (38 C) OR HIGHER *CHILLS OR SWEATING *NAUSEA AND VOMITING THAT IS NOT CONTROLLED WITH YOUR NAUSEA MEDICATION *UNUSUAL SHORTNESS OF BREATH *UNUSUAL BRUISING OR BLEEDING *URINARY PROBLEMS (pain or burning when urinating, or frequent urination) *BOWEL PROBLEMS (unusual diarrhea, constipation, pain near the anus) TENDERNESS IN MOUTH AND THROAT WITH OR WITHOUT PRESENCE OF ULCERS (sore throat, sores in mouth, or a toothache) UNUSUAL RASH, SWELLING OR PAIN  UNUSUAL VAGINAL DISCHARGE OR ITCHING   Items with * indicate a potential emergency and should be followed up as soon as possible or go to the Emergency Department if any problems should occur.  Please show the CHEMOTHERAPY ALERT CARD or  IMMUNOTHERAPY ALERT CARD at check-in to the Emergency Department and triage nurse.  Should you have questions after your visit or need to cancel or reschedule your appointment, please contact [facility] - A DEPT OF [facility]  Dept: [phone]  and follow the prompts.  Office hours are 8:00 a.m. to 4:30 p.m. Monday - Friday. Please note that voicemails left after 4:00 p.m. may not be returned until the following business day.  We are closed weekends and major holidays. You have access to a nurse at all times for urgent questions. Please call the main number to the clinic Dept: [phone] and follow the prompts.   For any non-urgent questions, you may also contact your provider using MyChart. We now offer e-Visits for anyone 64 and older to request care online for non-urgent symptoms. For details visit [email or web address].   Also download the MyChart app! Go to the app store, search "MyChart", open the app, select [facility], and log in with your MyChart username and password.

## 2023-02-06 NOTE — Progress Notes (Signed)
  Radiation Oncology         [phone] ________________________________  Name: PA TENNANT MRN: [id number]  Date: 02/07/2023  DOB: 1950/05/23  CC: [name], FNP  [name], MD  HDR BRACHYTHERAPY NOTE  DIAGNOSIS: The encounter diagnosis was Endometrial cancer [facility]).   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 She will be treated with a 2.5 cm diameter segmented cylinder. This conforms to her anatomy without undue discomfort.  Vaginal brachytherapy procedure node: The patient was brought to the HDR suite. Identity was confirmed. All relevant records and images related to the planned course of therapy were reviewed. The patient freely provided informed written consent to proceed with treatment after reviewing the details related to the planned course of therapy. The consent form was witnessed and verified by the simulation staff. Then, the patient was set-up in a stable reproducible supine position for radiation therapy. Pelvic exam revealed the vaginal cuff to be intact . The patient's custom vaginal cylinder was placed in the proximal vagina. This was affixed to the CT/MR stabilization plate to prevent slippage. Patient tolerated the placement well.  Verification simulation note:  A fiducial marker was placed within the vaginal cylinder. An AP and lateral film was then obtained through the pelvis area. This documented accurate position of the vaginal cylinder for treatment.  HDR  BRACHYTHERAPY TREATMENT  The remote afterloading device was affixed to the vaginal cylinder by catheter. Patient then proceeded to undergo her fifth high-dose-rate treatment directed at the proximal vagina. The patient was prescribed a dose of 6.0 gray to be delivered to the mucosal surface. Treatment length was 3.0 cm. Patient was treated with 1 channel using 7 dwell positions. Treatment time was 138.1 seconds. Iridium 192 was the high-dose-rate source for treatment. The patient tolerated the treatment well. After completion of her therapy, a radiation survey was performed documenting return of the iridium source into the [employer] safe.   PLAN: The patient has completed her fifth and final high-dose-rate treatment. She tolerated treatment relatively well overall without any significant side effects . She will return here for a routine follow-up visit in one month.   ________________________________  -----------------------------------  Lynwood CHARM Nasuti, PhD, MD  This document serves as a record of services personally performed by Lynwood Nasuti, MD. It was created on his behalf by Dorthy Fuse, a trained medical scribe. The creation of this record is based on the scribe's personal observations and the provider's statements to them. This document has been checked and approved by the attending provider.

## 2023-02-07 ENCOUNTER — Other Ambulatory Visit: Payer: Self-pay

## 2023-02-07 ENCOUNTER — Ambulatory Visit
Admission: RE | Admit: 2023-02-07 | Discharge: 2023-02-07 | Disposition: A | Discharge status: Home / Self Care | Payer: Medicare HMO | Source: Ambulatory Visit | Attending: Radiation Oncology | Admitting: Radiation Oncology

## 2023-02-07 DIAGNOSIS — C541 Malignant neoplasm of endometrium: Secondary | ICD-10-CM

## 2023-02-07 LAB — RAD ONC ARIA SESSION SUMMARY
Course Elapsed Days: 20
Plan Fractions Treated to Date: 5
Plan Prescribed Dose Per Fraction: 6 Gy
Plan Total Fractions Prescribed: 5
Plan Total Prescribed Dose: 30 Gy
Reference Point Dosage Given to Date: 30 Gy
Reference Point Session Dosage Given: 6 Gy
Session Number: 5

## 2023-02-08 ENCOUNTER — Telehealth: Payer: Self-pay

## 2023-02-08 ENCOUNTER — Other Ambulatory Visit: Payer: Medicare HMO

## 2023-02-08 ENCOUNTER — Telehealth: Payer: Self-pay | Admitting: *Deleted

## 2023-02-08 ENCOUNTER — Other Ambulatory Visit: Payer: Self-pay | Admitting: Radiation Oncology

## 2023-02-08 DIAGNOSIS — E782 Mixed hyperlipidemia: Secondary | ICD-10-CM

## 2023-02-08 DIAGNOSIS — E1140 Type 2 diabetes mellitus with diabetic neuropathy, unspecified: Secondary | ICD-10-CM

## 2023-02-08 NOTE — Telephone Encounter (Signed)
 Called placed to make patient aware that Dr. Roselind Messier would send in prescription  for antifungal medication to [place] [facility] [place]. Spoke with daughter and she voiced understanding.

## 2023-02-08 NOTE — Telephone Encounter (Signed)
 Spoke with [employer] patient's daughter who called the office to state her mother Shaindel is complaining of vaginal itching. She denies a discharge other than a very faint blood discharge on panty liner. Pt denies fever, chills, and states she can't tell if her vulva is red and asked if it was more vulva itching or vaginal? States more vaginal.  Summer states her mother had her last radiation treatment yesterday and will see Dr. Shannon again on February 6th. She states, the itching feels like a yeast infection  Pt's daughter wasn't sure if this was from her radiation treatments or if she needs to be seen. Advised Summer to call Dr. Colton office and her message would also be relayed to our providers and the office would call back with recommendations.

## 2023-02-08 NOTE — Radiation Completion Notes (Signed)
 Patient Name: ELSE, HABERMANN MRN: [id number] Date of Birth: December 16, 1950 Referring Physician: KATHERINE TUCKER, M.D. Date of Service: 2023-02-08 Radiation Oncologist: Signe Nasuti, M.D. [facility] [place]                             RADIATION ONCOLOGY END OF TREATMENT NOTE     Diagnosis: C54.1 Malignant neoplasm of endometrium Staging on 2022-11-25: Endometrial cancer (HCC) T=pT1b, N=pN0, M=cM0 Intent: Curative     ==========DELIVERED PLANS==========  First Treatment Date: 2023-01-18 Last Treatment Date: 2023-02-07   Plan Name: [id number] Site: Vagina Technique: HDR Ir-192 Mode: Brachytherapy Dose Per Fraction: 6 Gy Prescribed Dose (Delivered / Prescribed): 30 Gy / 30 Gy Prescribed Fxs (Delivered / Prescribed): 5 / 5     ==========ON TREATMENT VISIT DATES========== 2023-01-18, 2023-01-20, 2023-01-31, 2023-02-03, 2023-02-07     ==========UPCOMING VISITS========== 04/27/2023 [facility] OFFICE OFFICE VISIT [place]ira Newman PARAS, MD  03/10/2023 [facility] ONC FOLLOW UP 15 Nasuti Agent, MD  02/25/2023 [facility] PORT FLUSH W/LAB [facility] FLUSH  02/25/2023 [facility] EST PT 20 [name], MD  02/25/2023 [facility] INFUSION 5HR15MIN (315) [facility] INFUSION  02/08/2023 [facility] OFFICE LAB 10 [facility]        ==========APPENDIX - ON TREATMENT VISIT NOTES==========   See weekly On Treatment Notes in Epic for details in the Media tab (listed as Progress notes on the On Treatment Visit Dates listed above).

## 2023-02-08 NOTE — Telephone Encounter (Signed)
 Returned daughters. Her Mom is having some vaginal itching. She called GYN earlier. Told her a message has been forwarded to Dr. Shannon. She verbalized understanding.  Daughter said that her Mom complains of weird sensation to her port with washing/showering at times. Denies injury or redness. Daughter will call the office back if earlier appt needed with Dr. Lonn.

## 2023-02-08 NOTE — Telephone Encounter (Signed)
 I have forwarded to Dr. Roselind Messier. We will follow up with the patient. Thanks

## 2023-02-09 ENCOUNTER — Telehealth: Payer: Self-pay

## 2023-02-09 ENCOUNTER — Other Ambulatory Visit: Payer: Self-pay | Admitting: Radiation Oncology

## 2023-02-09 ENCOUNTER — Emergency Department (HOSPITAL_COMMUNITY)
Admission: EM | Admit: 2023-02-09 | Discharge: 2023-02-10 | Discharge status: Against Medical Advice | Payer: Self-pay | Attending: Emergency Medicine | Admitting: Emergency Medicine

## 2023-02-09 DIAGNOSIS — R309 Painful micturition, unspecified: Secondary | ICD-10-CM | POA: Insufficient documentation

## 2023-02-09 DIAGNOSIS — L292 Pruritus vulvae: Secondary | ICD-10-CM | POA: Insufficient documentation

## 2023-02-09 DIAGNOSIS — Z5321 Procedure and treatment not carried out due to patient leaving prior to being seen by health care provider: Secondary | ICD-10-CM | POA: Insufficient documentation

## 2023-02-09 MED ORDER — FLUCONAZOLE 150 MG PO TABS: 150.0000 mg | ORAL_TABLET | Freq: Every day | ORAL | 0 refills | Status: DC

## 2023-02-09 NOTE — Telephone Encounter (Signed)
 Called patients daughter to ask when was the last time patient took Colchicine  due to medication having an interaction with Diflucan . Per daughter patient has not taken dose of Colchicine  in 2 weeks. Patient to hold colchicine  until completing diflucan . Daughter voiced understanding.

## 2023-02-10 ENCOUNTER — Ambulatory Visit: Payer: Medicare HMO

## 2023-02-10 ENCOUNTER — Other Ambulatory Visit: Payer: Self-pay

## 2023-02-10 ENCOUNTER — Ambulatory Visit
Admission: RE | Admit: 2023-02-10 | Discharge: 2023-02-10 | Disposition: A | Discharge status: Home / Self Care | Payer: Medicare HMO | Source: Ambulatory Visit | Attending: Radiation Oncology | Admitting: Radiation Oncology

## 2023-02-10 ENCOUNTER — Encounter (HOSPITAL_COMMUNITY): Payer: Self-pay

## 2023-02-10 ENCOUNTER — Encounter: Payer: Self-pay | Admitting: Radiation Oncology

## 2023-02-10 ENCOUNTER — Telehealth: Payer: Self-pay | Admitting: *Deleted

## 2023-02-10 VITALS — BP 180/81 | HR 87 | Temp 98.3°F | Resp 16 | Ht 62.0 in | Wt 166.6 lb

## 2023-02-10 DIAGNOSIS — C541 Malignant neoplasm of endometrium: Secondary | ICD-10-CM | POA: Diagnosis not present

## 2023-02-10 LAB — URINALYSIS, ROUTINE W REFLEX MICROSCOPIC
Bilirubin Urine: NEGATIVE
Glucose, UA: NEGATIVE mg/dL
Hgb urine dipstick: NEGATIVE
Ketones, ur: NEGATIVE mg/dL
Nitrite: NEGATIVE
Protein, ur: 100 mg/dL — AB
Specific Gravity, Urine: 1.02 (ref 1.005–1.030)
WBC, UA: >50 WBC/hpf (ref 0–5)
pH: 6 (ref 5.0–8.0)

## 2023-02-10 LAB — WET PREP, GENITAL
Clue Cells Wet Prep HPF POC: NONE SEEN
Sperm: NONE SEEN
Trich, Wet Prep: NONE SEEN
WBC, Wet Prep HPF POC: <10 (ref <10)
Yeast Wet Prep HPF POC: NONE SEEN

## 2023-02-10 MED ORDER — METRONIDAZOLE 500 MG PO TABS: 500.0000 mg | ORAL_TABLET | Freq: Two times a day (BID) | ORAL | 0 refills | Status: DC

## 2023-02-10 NOTE — Progress Notes (Signed)
 Radiation Oncology         [phone] ________________________________  Name: Alice Hickman MRN: [id number]  Date: 02/10/2023  DOB: 1950/02/06  Follow-Up Visit Note  CC: Alice Nell SAILOR, FNP  Alice Comer SAUNDERS, MD    ICD-10-CM   1. Endometrial cancer (HCC)  C54.1 Wet prep, genital    Aerobic Culture w Gram Stain (superficial specimen)    Aerobic Culture w Gram Stain (superficial specimen)      Diagnosis: The encounter diagnosis was Endometrial cancer (HCC).   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; MMR normal; >50% myometrial invasion; negative for nodal involvement: s/p hysterectomy, BSO, and nodal biopsies       Cancer Staging  Endometrial cancer [facility]) Staging form: Corpus Uteri - Carcinoma and Carcinosarcoma, AJCC 8th Edition - Pathologic stage from 11/25/2022: FIGO Stage II, calculated as Stage IB (pT1b, pN0, cM0) - Signed by Alice Hicks, MD on 11/25/2022  Interval Since Last Radiation: 3 days   Indication for treatment: Curative         Radiation treatment dates: First Treatment Date: 2023-01-18 - Last Treatment Date: 2023-02-07   Site/Dose/Technique/Mode:    Site: Vagina Technique: HDR Ir-192 Mode: Brachytherapy Dose Per Fraction: 6 Gy Prescribed Dose (Delivered / Prescribed): 30 Gy / 30 Gy Prescribed Fxs (Delivered / Prescribed): 5 / 5  Narrative:  The patient returns today for a symptom check. She tolerated radiation treatment relatively well without any significant side effects. She however presented to the ED yesterday with pain and burning with urination. She also reported having thick white vaginal discharge. She took diflucan  but did not achieve any relief. She ultimately left the ED before being evaluated due to wait times. UA obtained in the hospital showed proteinuria and positive leukocytes. Urine culture is pending at this time.   Interval history:   Since her consultation date of 12/06/22, the patient's  case was presented at the gyn-onc tumor board held on 12/13/22. Disposition concluded at that time was to adjuvant treatment with chemotherapy and vaginal brachytherapy. Her2 testing and a referral for genetic testing were also recommended.     Upon further discussion with Dr. Lonn, the patient opted to proceed with adjuvant chemotherapy consisting of carboplatin  and taxol  on 12/17/22. Chemo toxicities encountered by the patient thus far have included: mucositis, pancytopenia, and constipation. In light of her side effects, her most recent cycle on 01/07/23 was administered at a reduced dose.    In the setting of her chemo inducted constipation, the patient presented to the ED on 01/13/23 following a syncopal episode attributed to straining while on the toilet. She also endorsed new onset of abdominal pain and low PO intake. ED work-up included a CT AP which showed not acute findings, and colonic diverticulosis, without radiographic evidence of diverticulitis. It seems as though she left following her triage evaluation. She however presented to the the ED the following day with the same complaints in addition to rectal bleeding following a strained bowel movement. She was ultimately admitted based on positive FOBT. GI was accordingly consulted and she was started on Colace 100 mg twice daily, MiraLAX , and hydrocortisone  suppositories with improvement achieved at discharge on 12/14. She was also given IV fluids for dehydration.      During her most recent follow-up visit (prior to her next cycle of treatment) with Dr. Lonn on 02/02/22, the patient was noted to have worsening pancytopenia from chemotherapy and persistence of her other side effects including  constipation and mucositis. In light of her ongoing and progressive symptoms, her chemo dose was reduced again.   Today, Alice Hickman complains of white creamy discharge and took one dose of diflucan  on 02/09/2023. She also endorses dysuria and a small  amount of blood prior to her last HDR treatment on 02/07/2023. She has not had any symptomatic relief since taking the diflucan .                          Allergies:  is allergic to penicillins, lovastatin, and metformin.  Meds: Current Outpatient Medications  Medication Sig Dispense Refill   metroNIDAZOLE  (FLAGYL ) 500 MG tablet Take 1 tablet (500 mg total) by mouth 2 (two) times daily. 14 tablet 0   acetaminophen  (TYLENOL ) 325 MG tablet Take 2 tablets (650 mg total) by mouth every 6 (six) hours as needed for mild pain (pain score 1-3) (or Fever >/= 101). 20 tablet 0   allopurinol  (ZYLOPRIM ) 100 MG tablet Take 100 mg by mouth daily.     aspirin  81 MG chewable tablet Chew 81 mg by mouth daily. (Patient not taking: Reported on 01/14/2023)     Colchicine  0.6 MG CAPS Take 0.6 mg by mouth 2 (two) times daily as needed (gout flare pain).     dexamethasone  (DECADRON ) 4 MG tablet Take 2 tabs at the night before and 2 tab the morning of chemotherapy, every 3 weeks, by mouth x 6 cycles (Patient taking differently: Take 8 mg by mouth See admin instructions. Take 2 tabs at the night before and 2 tab the morning of chemotherapy, every 3 weeks, by mouth x 6 cycles) 24 tablet 6   docusate sodium  (COLACE) 100 MG capsule Take 1 capsule (100 mg total) by mouth 2 (two) times daily. 10 capsule 0   fluconazole  (DIFLUCAN ) 150 MG tablet Take 1 tablet (150 mg total) by mouth daily. Take 2nd tablet 72 hours later 2 tablet 0   glimepiride  (AMARYL ) 2 MG tablet TAKE ONE TABLET BY MOUTH ONCE DAILY BEFORE BREAKFAST (Patient taking differently: Take 2 mg by mouth daily.) 90 tablet 0   hydrocortisone  (ANUSOL -HC) 25 MG suppository Place 1 suppository (25 mg total) rectally 2 (two) times daily. 12 suppository 0   latanoprost (XALATAN) 0.005 % ophthalmic solution Place 1 drop into both eyes at bedtime. (Patient not taking: Reported on 01/14/2023)     lidocaine -prilocaine  (EMLA ) cream Apply to affected area once 30 g 3   magic  mouthwash (nystatin , lidocaine , diphenhydrAMINE, alum & mag hydroxide) suspension Swish and spit 5 mls by mouth 4 times a day for 7 days 160 mL 0   olmesartan  (BENICAR ) 40 MG tablet Take 1 tablet (40 mg total) by mouth daily. 30 tablet 11   ondansetron  (ZOFRAN ) 8 MG tablet Take 1 tablet (8 mg total) by mouth every 8 (eight) hours as needed for nausea or vomiting. Start on the third day after chemotherapy. 30 tablet 1   polyethylene glycol (MIRALAX  / GLYCOLAX ) 17 g packet Take 17 g by mouth at bedtime. 14 each 0   prochlorperazine  (COMPAZINE ) 10 MG tablet Take 1 tablet (10 mg total) by mouth every 6 (six) hours as needed for nausea or vomiting. 30 tablet 1   rosuvastatin  (CRESTOR ) 20 MG tablet Take 1 tablet (20 mg total) by mouth daily. 90 tablet 3   senna-docusate (SENOKOT-S) 8.6-50 MG tablet Take 2 tablets by mouth at bedtime. For AFTER surgery, do not take if having diarrhea 30 tablet 0  traMADol  (ULTRAM ) 50 MG tablet Take 1 tablet (50 mg total) by mouth every 6 (six) hours as needed for severe pain (pain score 7-10). 60 tablet 0   No current facility-administered medications for this encounter.    Physical Findings: The patient is in no acute distress. Patient is alert and oriented.  height is 5' 2 (1.575 m) and weight is 166 lb 9.6 oz (75.6 kg). Her oral temperature is 98.3 F (36.8 C). Her blood pressure is 180/81 (abnormal) and her pulse is 87. Her respiration is 16 and oxygen saturation is 100%. .  In general this is a well appearing female in no acute distress. She's alert and oriented x4 and appropriate throughout the examination. Cardiopulmonary assessment is negative for acute distress and she exhibits normal effort.      On pelvic examination a thick, white vaginal discharge was noted.  Some erythema to the external genitalia. A pediatric speculum exam was performed which elicited exquisite tenderness for the patient. There are no mucosal lesions noted in the vaginal vault.  Thick  white discharge noted in the proximal vagina.    Lab Findings: Lab Results  Component Value Date   WBC 3.0 (L) 02/03/2023   HGB 8.2 (L) 02/03/2023   HCT 25.4 (L) 02/03/2023   MCV 87.9 02/03/2023   PLT 198 02/03/2023    Radiographic Findings: CT ABDOMEN PELVIS W CONTRAST Result Date: 01/13/2023 CLINICAL DATA:  Acute abdominal pain. Constipation. High-grade endometrial carcinoma. * Tracking Code: BO * EXAM: CT ABDOMEN AND PELVIS WITH CONTRAST TECHNIQUE: Multidetector CT imaging of the abdomen and pelvis was performed using the standard protocol following bolus administration of intravenous contrast. RADIATION DOSE REDUCTION: This exam was performed according to the departmental dose-optimization program which includes automated exposure control, adjustment of the mA and/or kV according to patient size and/or use of iterative reconstruction technique. CONTRAST:  OMNIPAQUE  IOHEXOL  300 MG/ML  SOLN COMPARISON:  10/28/2022 FINDINGS: Lower Chest: No acute findings. Hepatobiliary: No suspicious hepatic masses identified. Gallbladder is unremarkable. No evidence of biliary ductal dilatation. Pancreas:  No mass or inflammatory changes. Spleen: Within normal limits in size and appearance. Adrenals/Urinary Tract: No suspicious masses identified. No evidence of ureteral calculi or hydronephrosis. Stomach/Bowel: No evidence of obstruction, inflammatory process or abnormal fluid collections. Normal appendix visualized. Diverticulosis is seen mainly involving the descending and sigmoid colon, however there is no evidence of diverticulitis. Vascular/Lymphatic: No pathologically enlarged lymph nodes. No acute vascular findings. Reproductive: Prior hysterectomy noted. Vaginal cuff and adnexal regions are unremarkable in appearance. No No evidence of mass or ascites. Other:  None. Musculoskeletal:  No suspicious bone lesions identified. IMPRESSION: No acute findings. Colonic diverticulosis, without radiographic  evidence of diverticulitis. Prior hysterectomy. No evidence of recurrent or metastatic carcinoma. Electronically Signed   By: Norleen DELENA Kil M.D.   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 Patient's presentation today is concerning for possible yeast infection. She has taken one dose of fluconazole , but has not yet completed her full prescription. It is likely that she has not had time to respond to this medication.    A culture obtained during the pelvic exam has been sent to the lab. We will treat accordingly.  If urine culture shows bacteria we can also treat her  for UTI.  Plan:  Recommend completing full prescription of fluconazole . If symptoms do not improve patient can try Flagyl . Prescription for Flagyl  sent to her preferred pharmacy today.   Patient knows to call with any questions or concerns. She is scheduled to see us  for routine follow-up on 03/10/2023.  GYN oncology to check with her early next week.   30 minutes of total time was spent for this patient encounter, including preparation, face-to-face counseling with the patient and coordination of care, physical exam, and documentation of the encounter. ____________________________________   Leeroy Due, PA-C   Lynwood CHARM Nasuti, PhD, MD   [facility]  Radiation Oncology Direct Dial: [phone]  Fax: [phone] [email or web address]   This document serves as a record of services personally performed by Lynwood Nasuti, MD and Leeroy Due, PA-C. It was created on his behalf by Dorthy Fuse, a trained medical scribe. The creation of this record is based on the scribe's personal observations and the provider's statements to them. This document has been checked and approved by the attending  provider.

## 2023-02-10 NOTE — Progress Notes (Signed)
  Radiation Oncology         [phone] ________________________________  Name: Alice Hickman MRN: [id number]  Date: 02/10/2023  DOB: April 17, 1950  End of Treatment Note  Diagnosis: The encounter diagnosis was Endometrial cancer (HCC).   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; MMR normal; >50% myometrial invasion; negative for nodal involvement: s/p hysterectomy, BSO, and nodal biopsies       Cancer Staging  Endometrial cancer [facility]) Staging form: Corpus Uteri - Carcinoma and Carcinosarcoma, AJCC 8th Edition - Pathologic stage from 11/25/2022: FIGO Stage II, calculated as Stage IB (pT1b, pN0, cM0) - Signed by [name], MD on 11/25/2022  Indication for treatment: Curative        Radiation treatment dates: First Treatment Date: 2023-01-18 - Last Treatment Date: 2023-02-07  Site/Dose/Technique/Mode:   Site: Vagina Technique: HDR Ir-192 Mode: Brachytherapy Dose Per Fraction: 6 Gy Prescribed Dose (Delivered / Prescribed): 30 Gy / 30 Gy Prescribed Fxs (Delivered / Prescribed): 5 / 5  Narrative: The patient tolerated radiation treatment relatively well without any significant side effects.  Plan: The patient has completed radiation treatment. The patient will return to radiation oncology clinic for routine followup in one month. I advised them to call or return sooner if they have any questions or concerns related to their recovery or treatment.  -----------------------------------  Lynwood CHARM Nasuti, PhD, MD  This document serves as a record of services personally performed by Lynwood Nasuti, MD. It was created on his behalf by Dorthy Fuse, a trained medical scribe. The creation of this record is based on the scribe's personal observations and the provider's statements to them. This document has been checked and approved by the attending provider.

## 2023-02-10 NOTE — ED Triage Notes (Signed)
 Pt received last vaginal radiation for endometrial cancer 4 days ago and since that time she has had pain and burning with urination, as well as a thick white vaginal discharge. Pt took Diflucan today but reports no relief yet.

## 2023-02-10 NOTE — Progress Notes (Addendum)
 Alice Hickman is here today for follow up post radiation to the pelvic.  They completed their radiation on: 02/07/23   Does the patient complain of any of the following:  Pain:No Abdominal bloating: No Diarrhea/Constipation: No Nausea/Vomiting: No Vaginal Discharge: Yes, white creamy discharge. Patient was started on diflucan  on 02/09/23. Blood in Urine or Stool: small amount of blood prior to last HDR treatment on 02/07/23. Urinary Issues (dysuria/incomplete emptying/ incontinence/ increased frequency/urgency): burning on urination.  Post radiation skin changes: Red, and swollen.    Additional comments if applicable:  Patient had UA C&S  performed in the ED this am.   BP (!) 180/81 (BP Location: Right Arm, Patient Position: Sitting)   Pulse 87   Temp 98.3 F (36.8 C) (Oral)   Resp 16   Ht 5' 2 (1.575 m)   Wt 166 lb 9.6 oz (75.6 kg)   SpO2 100%   BMI 30.47 kg/m

## 2023-02-10 NOTE — Telephone Encounter (Signed)
 Left a message for Pt's daughter to schedule a f/u appt on Tuesday.

## 2023-02-10 NOTE — Telephone Encounter (Signed)
 Summer  (Pt's daughter) called back, an appt is  made for 11 am on Tuesday. We will f/u Monday to check to see if her mother is feeling better, if the medication is working and she feels better, we can cancel her appt for Tuesday

## 2023-02-10 NOTE — ED Notes (Signed)
 Pt stated that they can no longer wait and has left the building.

## 2023-02-11 ENCOUNTER — Encounter: Payer: Self-pay | Admitting: Hematology and Oncology

## 2023-02-11 LAB — URINE CULTURE

## 2023-02-12 LAB — AEROBIC CULTURE W GRAM STAIN (SUPERFICIAL SPECIMEN)

## 2023-02-14 ENCOUNTER — Other Ambulatory Visit: Payer: Self-pay | Admitting: Radiation Oncology

## 2023-02-14 ENCOUNTER — Telehealth: Payer: Self-pay

## 2023-02-14 MED ORDER — FLUCONAZOLE 100 MG PO TABS: 100.0000 mg | ORAL_TABLET | Freq: Every day | ORAL | 0 refills | Status: DC

## 2023-02-14 NOTE — Telephone Encounter (Signed)
 I called Alice Hickman to update medications for appointment tomorrow 1/14. Pt's daughter, Summer, states her mom is doing better, the Diflucan  has helped. She is no longer having any vaginal itching or burning, no fever/chills.  Summer has asked us  to cancel appointment with Eleanor Epps NP.   Melissa notified.

## 2023-02-15 ENCOUNTER — Ambulatory Visit: Payer: Medicare HMO | Admitting: Gynecologic Oncology

## 2023-02-16 ENCOUNTER — Encounter: Payer: Self-pay | Admitting: Hematology and Oncology

## 2023-02-24 MED FILL — Fosaprepitant Dimeglumine For IV Infusion 150 MG (Base Eq): INTRAVENOUS | Qty: 5 | Status: AC

## 2023-02-25 ENCOUNTER — Inpatient Hospital Stay (HOSPITAL_BASED_OUTPATIENT_CLINIC_OR_DEPARTMENT_OTHER): Payer: Medicare HMO | Admitting: Hematology and Oncology

## 2023-02-25 ENCOUNTER — Encounter: Payer: Self-pay | Admitting: Hematology and Oncology

## 2023-02-25 ENCOUNTER — Inpatient Hospital Stay: Payer: Medicare HMO

## 2023-02-25 VITALS — BP 152/67 | HR 64 | Temp 98.1°F | Resp 18 | Ht 62.0 in | Wt 170.4 lb

## 2023-02-25 DIAGNOSIS — D61818 Other pancytopenia: Secondary | ICD-10-CM

## 2023-02-25 DIAGNOSIS — C541 Malignant neoplasm of endometrium: Secondary | ICD-10-CM

## 2023-02-25 DIAGNOSIS — K5909 Other constipation: Secondary | ICD-10-CM

## 2023-02-25 LAB — CMP (CANCER CENTER ONLY)
ALT: 11 U/L (ref 0–44)
AST: 15 U/L (ref 15–41)
Albumin: 3.6 g/dL (ref 3.5–5.0)
Alkaline Phosphatase: 51 U/L (ref 38–126)
Anion gap: 9 (ref 5–15)
BUN: 8 mg/dL (ref 8–23)
CO2: 25 mmol/L (ref 22–32)
Calcium: 9.4 mg/dL (ref 8.9–10.3)
Chloride: 110 mmol/L (ref 98–111)
Creatinine: 0.85 mg/dL (ref 0.44–1.00)
GFR, Estimated: >60 mL/min (ref >60)
Glucose, Bld: 145 mg/dL — ABNORMAL HIGH (ref 70–99)
Potassium: 3.5 mmol/L (ref 3.5–5.1)
Sodium: 144 mmol/L (ref 135–145)
Total Bilirubin: 0.5 mg/dL (ref 0.0–1.2)
Total Protein: 6.3 g/dL — ABNORMAL LOW (ref 6.5–8.1)

## 2023-02-25 LAB — CBC WITH DIFFERENTIAL (CANCER CENTER ONLY)
Abs Immature Granulocytes: 0.02 10*3/uL (ref 0.00–0.07)
Basophils Absolute: 0 10*3/uL (ref 0.0–0.1)
Basophils Relative: 0 %
Eosinophils Absolute: 0 10*3/uL (ref 0.0–0.5)
Eosinophils Relative: 0 %
HCT: 26.4 % — ABNORMAL LOW (ref 36.0–46.0)
Hemoglobin: 8.6 g/dL — ABNORMAL LOW (ref 12.0–15.0)
Immature Granulocytes: 1 %
Lymphocytes Relative: 17 %
Lymphs Abs: 0.5 10*3/uL — ABNORMAL LOW (ref 0.7–4.0)
MCH: 28.7 pg (ref 26.0–34.0)
MCHC: 32.6 g/dL (ref 30.0–36.0)
MCV: 88 fL (ref 80.0–100.0)
Monocytes Absolute: 0.1 10*3/uL (ref 0.1–1.0)
Monocytes Relative: 3 %
Neutro Abs: 2.4 10*3/uL (ref 1.7–7.7)
Neutrophils Relative %: 79 %
Platelet Count: 109 10*3/uL — ABNORMAL LOW (ref 150–400)
RBC: 3 MIL/uL — ABNORMAL LOW (ref 3.87–5.11)
RDW: 19.3 % — ABNORMAL HIGH (ref 11.5–15.5)
WBC Count: 3.1 10*3/uL — ABNORMAL LOW (ref 4.0–10.5)
nRBC: 0 % (ref 0.0–0.2)

## 2023-02-25 MED ORDER — DEXAMETHASONE SODIUM PHOSPHATE 10 MG/ML IJ SOLN
10.0000 mg | Freq: Once | INTRAMUSCULAR | Status: AC
  Administered 2023-02-25: 10 mg via INTRAVENOUS
  Filled 2023-02-25: qty 1

## 2023-02-25 MED ORDER — CETIRIZINE HCL 10 MG/ML IV SOLN
10.0000 mg | Freq: Once | INTRAVENOUS | Status: AC
  Administered 2023-02-25: 10 mg via INTRAVENOUS
  Filled 2023-02-25: qty 1

## 2023-02-25 MED ORDER — FAMOTIDINE IN NACL 20-0.9 MG/50ML-% IV SOLN
20.0000 mg | Freq: Once | INTRAVENOUS | Status: AC
  Administered 2023-02-25: 20 mg via INTRAVENOUS
  Filled 2023-02-25: qty 50

## 2023-02-25 MED ORDER — SODIUM CHLORIDE 0.9 % IV SOLN: INTRAVENOUS | Status: DC

## 2023-02-25 MED ORDER — SODIUM CHLORIDE 0.9 % IV SOLN
140.0000 mg/m2 | Freq: Once | INTRAVENOUS | Status: AC
  Administered 2023-02-25: 252 mg via INTRAVENOUS
  Filled 2023-02-25: qty 42

## 2023-02-25 MED ORDER — SODIUM CHLORIDE 0.9% FLUSH
10.0000 mL | Freq: Once | INTRAVENOUS | Status: AC
  Administered 2023-02-25: 10 mL

## 2023-02-25 MED ORDER — HEPARIN SOD (PORK) LOCK FLUSH 100 UNIT/ML IV SOLN
500.0000 [IU] | Freq: Once | INTRAVENOUS | Status: AC | PRN
  Administered 2023-02-25: 500 [IU]

## 2023-02-25 MED ORDER — SODIUM CHLORIDE 0.9 % IV SOLN
354.0000 mg | Freq: Once | INTRAVENOUS | Status: AC
  Administered 2023-02-25: 350 mg via INTRAVENOUS
  Filled 2023-02-25: qty 35

## 2023-02-25 MED ORDER — SODIUM CHLORIDE 0.9 % IV SOLN
150.0000 mg | Freq: Once | INTRAVENOUS | Status: AC
  Administered 2023-02-25: 150 mg via INTRAVENOUS
  Filled 2023-02-25: qty 150

## 2023-02-25 MED ORDER — SODIUM CHLORIDE 0.9% FLUSH
10.0000 mL | INTRAVENOUS | Status: DC | PRN
Start: 2023-02-25 — End: 2023-02-25
  Administered 2023-02-25: 10 mL

## 2023-02-25 MED ORDER — PALONOSETRON HCL INJECTION 0.25 MG/5ML
0.2500 mg | Freq: Once | INTRAVENOUS | Status: AC
Start: 2023-02-25 — End: 2023-02-25
  Administered 2023-02-25: 0.25 mg via INTRAVENOUS
  Filled 2023-02-25: qty 5

## 2023-02-25 NOTE — Assessment & Plan Note (Signed)
Since recent dose reduction, she denies mucositis or severe side effects anymore She remains mildly pancytopenic but not symptomatic She is getting better control of her constipation We will proceed with treatment with similar dose reduction as before

## 2023-02-25 NOTE — Assessment & Plan Note (Signed)
She is doing better with recent dose reduction We will continue similar dose reduction as before She does not need transfusion support

## 2023-02-25 NOTE — Progress Notes (Signed)
[facility] OFFICE PROGRESS NOTE  Patient Care Team: Diamantina Providence, FNP as PCP - General (Nurse Practitioner)  ASSESSMENT & PLAN:  Endometrial cancer [facility]) Since recent dose reduction, she denies mucositis or severe side effects anymore She remains mildly pancytopenic but not symptomatic She is getting better control of her constipation We will proceed with treatment with similar dose reduction as before  Pancytopenia, acquired (HCC) She is doing better with recent dose reduction We will continue similar dose reduction as before She does not need transfusion support  Chronic constipation She will continue aggressive laxatives  No orders of the defined types were placed in this encounter.   All questions were answered. The patient knows to call the clinic with any problems, questions or concerns. The total time spent in the appointment was 25 minutes encounter with patients including review of chart and various tests results, discussions about plan of care and coordination of care plan   Artis Delay, MD 02/25/2023 9:30 AM  INTERVAL HISTORY: Please see below for problem oriented charting. she returns for chemo follow-up She is doing better since her last visit She had recent vaginal itching but with fluconazole, her symptoms resolved She denies severe constipation, mucositis or neuropathy  REVIEW OF SYSTEMS:   Constitutional: Denies fevers, chills or abnormal weight loss Eyes: Denies blurriness of vision Ears, nose, mouth, throat, and face: Denies mucositis or sore throat Respiratory: Denies cough, dyspnea or wheezes Cardiovascular: Denies palpitation, chest discomfort or lower extremity swelling Gastrointestinal:  Denies nausea, heartburn or change in bowel habits Skin: Denies abnormal skin rashes Lymphatics: Denies new lymphadenopathy or easy bruising Neurological:Denies numbness, tingling or new weaknesses Behavioral/Psych: Mood is stable, no new  changes  All other systems were reviewed with the patient and are negative.  I have reviewed the past medical history, past surgical history, social history and family history with the patient and they are unchanged from previous note.  ALLERGIES:  is allergic to penicillins, lovastatin, and metformin.  MEDICATIONS:  Current Outpatient Medications  Medication Sig Dispense Refill   acetaminophen (TYLENOL) 325 MG tablet Take 2 tablets (650 mg total) by mouth every 6 (six) hours as needed for mild pain (pain score 1-3) (or Fever >/= 101). 20 tablet 0   allopurinol (ZYLOPRIM) 100 MG tablet Take 100 mg by mouth daily.     aspirin 81 MG chewable tablet Chew 81 mg by mouth daily. (Patient not taking: Reported on 01/14/2023)     Colchicine 0.6 MG CAPS Take 0.6 mg by mouth 2 (two) times daily as needed (gout flare pain).     dexamethasone (DECADRON) 4 MG tablet Take 2 tabs at the night before and 2 tab the morning of chemotherapy, every 3 weeks, by mouth x 6 cycles (Patient taking differently: Take 8 mg by mouth See admin instructions. Take 2 tabs at the night before and 2 tab the morning of chemotherapy, every 3 weeks, by mouth x 6 cycles) 24 tablet 6   docusate sodium (COLACE) 100 MG capsule Take 1 capsule (100 mg total) by mouth 2 (two) times daily. 10 capsule 0   fluconazole (DIFLUCAN) 100 MG tablet Take 1 tablet (100 mg total) by mouth daily. 5 tablet 0   fluconazole (DIFLUCAN) 150 MG tablet Take 1 tablet (150 mg total) by mouth daily. Take 2nd tablet 72 hours later 2 tablet 0   glimepiride (AMARYL) 2 MG tablet TAKE ONE TABLET BY MOUTH ONCE DAILY BEFORE BREAKFAST (Patient taking differently: Take 2 mg by mouth  daily.) 90 tablet 0   hydrocortisone (ANUSOL-HC) 25 MG suppository Place 1 suppository (25 mg total) rectally 2 (two) times daily. 12 suppository 0   latanoprost (XALATAN) 0.005 % ophthalmic solution Place 1 drop into both eyes at bedtime. (Patient not taking: Reported on 01/14/2023)      lidocaine-prilocaine (EMLA) cream Apply to affected area once 30 g 3   magic mouthwash (nystatin, lidocaine, diphenhydrAMINE, alum & mag hydroxide) suspension Swish and spit 5 mls by mouth 4 times a day for 7 days 160 mL 0   metroNIDAZOLE (FLAGYL) 500 MG tablet Take 1 tablet (500 mg total) by mouth 2 (two) times daily. 14 tablet 0   olmesartan (BENICAR) 40 MG tablet Take 1 tablet (40 mg total) by mouth daily. 30 tablet 11   ondansetron (ZOFRAN) 8 MG tablet Take 1 tablet (8 mg total) by mouth every 8 (eight) hours as needed for nausea or vomiting. Start on the third day after chemotherapy. 30 tablet 1   polyethylene glycol (MIRALAX / GLYCOLAX) 17 g packet Take 17 g by mouth at bedtime. 14 each 0   prochlorperazine (COMPAZINE) 10 MG tablet Take 1 tablet (10 mg total) by mouth every 6 (six) hours as needed for nausea or vomiting. 30 tablet 1   rosuvastatin (CRESTOR) 20 MG tablet Take 1 tablet (20 mg total) by mouth daily. 90 tablet 3   senna-docusate (SENOKOT-S) 8.6-50 MG tablet Take 2 tablets by mouth at bedtime. For AFTER surgery, do not take if having diarrhea 30 tablet 0   traMADol (ULTRAM) 50 MG tablet Take 1 tablet (50 mg total) by mouth every 6 (six) hours as needed for severe pain (pain score 7-10). 60 tablet 0   No current facility-administered medications for this visit.   Facility-Administered Medications Ordered in Other Visits  Medication Dose Route Frequency Provider Last Rate Last Admin   0.9 %  sodium chloride infusion   Intravenous Continuous Bertis Ruddy, Lema Heinkel, MD 10 mL/hr at 02/25/23 0846 New Bag at 02/25/23 0846   CARBOplatin (PARAPLATIN) 350 mg in sodium chloride 0.9 % 100 mL chemo infusion  350 mg Intravenous Once Bertis Ruddy, Lesley Atkin, MD       fosaprepitant (EMEND) 150 mg in sodium chloride 0.9 % 145 mL IVPB  150 mg Intravenous Once Artis Delay, MD 450 mL/hr at 02/25/23 0922 150 mg at 02/25/23 1610   heparin lock flush 100 unit/mL  500 Units Intracatheter Once PRN Artis Delay, MD        PACLitaxel (TAXOL) 252 mg in sodium chloride 0.9 % 250 mL chemo infusion (> 80mg /m2)  140 mg/m2 (Treatment Plan Recorded) Intravenous Once Bertis Ruddy, Quay Simkin, MD       sodium chloride flush (NS) 0.9 % injection 10 mL  10 mL Intracatheter PRN Bertis Ruddy, Ruchama Kubicek, MD        SUMMARY OF ONCOLOGIC HISTORY: Oncology History Overview Note  High grade serous, MMR normal, 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    10/28/2022 Imaging   CT C/A/P: 1. Abnormally thickened endometrium (27 mm thickness), compatible with known primary endometrial malignancy. 2. No evidence of metastatic disease in the chest, abdomen or pelvis. 3. Moderate diffuse colonic diverticulosis. 4. One vessel coronary atherosclerosis. 5.  Aortic Atherosclerosis (ICD10-I70.0).   11/18/2022 Initial Diagnosis   Endometrial cancer (HCC)   11/18/2022 Surgery   Robotic-assisted laparoscopic total hysterectomy with bilateral salpingoophorectomy, SLN biopsy bilaterally, omentectomy, cystoscopy, vaginal repair  Findings: On EUA, enlarged mobile uterus. On intra-abdominal entry, normal upper abodminal survey. Normal omentum, small and large bowel. Uterus 12 cm with 4-5 cm calcified anterior fundal fibroid, mildly bulbous. Normal apeparing bilateral adnexa. Mapping successful to bilateral obturator SLNs, no obvious adenopathy. Right sulcal and right sidewall vaginal lacerations due to vaginal delivery of specimen, repaired. On cystoscopy, intact dome, good efflux from bilateral ureteral orifices.    11/25/2022 Cancer Staging   Staging form: Corpus Uteri - Carcinoma and Carcinosarcoma, AJCC 8th Edition - Pathologic stage from 11/25/2022: FIGO Stage II, calculated as Stage IB (pT1b, pN0, cM0) - Signed by Artis Delay, MD on 11/25/2022 Stage prefix: Initial diagnosis   12/10/2022 Procedure   Successful placement of a right IJ approach Power  Port with ultrasound and fluoroscopic guidance. The catheter is ready for use.   12/17/2022 -  Chemotherapy   Patient is on Treatment Plan : UTERINE Carboplatin AUC 6 + Paclitaxel q21d       PHYSICAL EXAMINATION: ECOG PERFORMANCE STATUS: 1 - Symptomatic but completely ambulatory  Vitals:   02/25/23 0811  BP: (!) 152/67  Pulse: 64  Resp: 18  Temp: 98.1 F (36.7 C)  SpO2: 100%   Filed Weights   02/25/23 0811  Weight: 170 lb 6.4 oz (77.3 kg)    GENERAL:alert, no distress and comfortable  LABORATORY DATA:  I have reviewed the data as listed    Component Value Date/Time   NA 144 02/25/2023 0745   NA 137 12/14/2019 0908   K 3.5 02/25/2023 0745   CL 110 02/25/2023 0745   CO2 25 02/25/2023 0745   GLUCOSE 145 (H) 02/25/2023 0745   BUN 8 02/25/2023 0745   BUN 25 12/14/2019 0908   CREATININE 0.85 02/25/2023 0745   CALCIUM 9.4 02/25/2023 0745   PROT 6.3 (L) 02/25/2023 0745   ALBUMIN 3.6 02/25/2023 0745   AST 15 02/25/2023 0745   ALT 11 02/25/2023 0745   ALKPHOS 51 02/25/2023 0745   BILITOT 0.5 02/25/2023 0745   GFRNONAA >60 02/25/2023 0745   GFRAA 52 (L) 12/14/2019 0908    No results found for: "SPEP", "UPEP"  Lab Results  Component Value Date   WBC 3.1 (L) 02/25/2023   NEUTROABS 2.4 02/25/2023   HGB 8.6 (L) 02/25/2023   HCT 26.4 (L) 02/25/2023   MCV 88.0 02/25/2023   PLT 109 (L) 02/25/2023      Chemistry      Component Value Date/Time   NA 144 02/25/2023 0745   NA 137 12/14/2019 0908   K 3.5 02/25/2023 0745   CL 110 02/25/2023 0745   CO2 25 02/25/2023 0745   BUN 8 02/25/2023 0745   BUN 25 12/14/2019 0908   CREATININE 0.85 02/25/2023 0745      Component Value Date/Time   CALCIUM 9.4 02/25/2023 0745   ALKPHOS 51 02/25/2023 0745   AST 15 02/25/2023 0745   ALT 11 02/25/2023 0745   BILITOT 0.5 02/25/2023 0745

## 2023-02-25 NOTE — Assessment & Plan Note (Signed)
She will continue aggressive laxatives

## 2023-02-25 NOTE — Patient Instructions (Signed)
[facility] - A DEPT OF [facility]  Discharge Instructions: Thank you for choosing [facility] to provide your oncology and hematology care.   If you have a lab appointment with the Cancer Center, please go directly to the Cancer Center and check in at the registration area.   Wear comfortable clothing and clothing appropriate for easy access to any Portacath or PICC line.   We strive to give you quality time with your provider. You may need to reschedule your appointment if you arrive late (15 or more minutes).  Arriving late affects you and other patients whose appointments are after yours.  Also, if you miss three or more appointments without notifying the office, you may be dismissed from the clinic at the provider's discretion.      For prescription refill requests, have your pharmacy contact our office and allow 72 hours for refills to be completed.    Today you received the following chemotherapy and/or immunotherapy agents: Taxol, Carboplatin      To help prevent nausea and vomiting after your treatment, we encourage you to take your nausea medication as directed.  BELOW ARE SYMPTOMS THAT SHOULD BE REPORTED IMMEDIATELY: *FEVER GREATER THAN 100.4 F (38 C) OR HIGHER *CHILLS OR SWEATING *NAUSEA AND VOMITING THAT IS NOT CONTROLLED WITH YOUR NAUSEA MEDICATION *UNUSUAL SHORTNESS OF BREATH *UNUSUAL BRUISING OR BLEEDING *URINARY PROBLEMS (pain or burning when urinating, or frequent urination) *BOWEL PROBLEMS (unusual diarrhea, constipation, pain near the anus) TENDERNESS IN MOUTH AND THROAT WITH OR WITHOUT PRESENCE OF ULCERS (sore throat, sores in mouth, or a toothache) UNUSUAL RASH, SWELLING OR PAIN  UNUSUAL VAGINAL DISCHARGE OR ITCHING   Items with * indicate a potential emergency and should be followed up as soon as possible or go to the Emergency Department if any problems should occur.  Please show the CHEMOTHERAPY ALERT CARD or  IMMUNOTHERAPY ALERT CARD at check-in to the Emergency Department and triage nurse.  Should you have questions after your visit or need to cancel or reschedule your appointment, please contact [facility] - [facility]  Dept: [phone]  and follow the prompts.  Office hours are 8:00 a.m. to 4:30 p.m. Monday - Friday. Please note that voicemails left after 4:00 p.m. may not be returned until the following business day.  We are closed weekends and major holidays. You have access to a nurse at all times for urgent questions. Please call the main number to the clinic Dept: [phone] and follow the prompts.   For any non-urgent questions, you may also contact your provider using MyChart. We now offer e-Visits for anyone 56 and older to request care online for non-urgent symptoms. For details visit [email or web address].   Also download the MyChart app! Go to the app store, search "MyChart", open the app, select [place], and log in with your MyChart username and password.

## 2023-03-09 NOTE — Progress Notes (Signed)
 Radiation Oncology         [phone] ________________________________  Name: Alice Hickman MRN: [id number]  Date: 03/10/2023  DOB: 14-Nov-1950  Follow-Up Visit Note  CC: [name], FNP  [name], MD    ICD-10-CM   1. Endometrial cancer (HCC) [C54.1]  C54.1       Diagnosis:  The encounter diagnosis was Endometrial cancer (HCC).   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; MMR normal; >50% myometrial invasion; negative for nodal involvement: s/p hysterectomy, BSO, and nodal biopsies        Cancer Staging  Endometrial cancer [facility]) Staging form: Corpus Uteri - Carcinoma and Carcinosarcoma, AJCC 8th Edition - Pathologic stage from 11/25/2022: FIGO Stage II, calculated as Stage IB (pT1b, pN0, cM0) - Signed by [name], MD on 11/25/2022  Indication for treatment: Curative        Interval Since Last Radiation:  1 month 1 day  Radiation treatment dates:  First Treatment Date: 2023-01-18 - Last Treatment Date: 2023-02-07   Site/Dose/Technique/Mode:    Site: Vagina Technique: HDR Ir-192 Mode: Brachytherapy Dose Per Fraction: 6 Gy Prescribed Dose (Delivered / Prescribed): 30 Gy / 30 Gy Prescribed Fxs (Delivered / Prescribed): 5 / 5   Narrative:  The patient returns today for routine follow-up. She was last seen in office on 02-10-23 for a follow- up. Since then, she completed radiation therapy without any reported significant side effects.   She presented for a follow up with Dr. Hicks Lonn on 02-25-23. At that time, she reported recent vaginal itchiness but that was resolved with fluconazole . Her constipation continues to be managed with laxatives. Dr. Lonn recommended continuing with chemotherapy at that time.   No other significant oncologic interval history since the patient was last seen.                         Patient reports to be doing well overall today. She denies any vaginal bleeding, abdominal pain, or  changes in her bladder habits. She continues to manage her constipation/diarrhea with laxatives. She was given dilators today and educated on their proper use.   Allergies:  is allergic to penicillins, lovastatin, and metformin.  Meds: Current Outpatient Medications  Medication Sig Dispense Refill   acetaminophen  (TYLENOL ) 325 MG tablet Take 2 tablets (650 mg total) by mouth every 6 (six) hours as needed for mild pain (pain score 1-3) (or Fever >/= 101). 20 tablet 0   allopurinol  (ZYLOPRIM ) 100 MG tablet Take 100 mg by mouth daily.     Colchicine  0.6 MG CAPS Take 0.6 mg by mouth 2 (two) times daily as needed (gout flare pain).     dexamethasone  (DECADRON ) 4 MG tablet Take 2 tabs at the night before and 2 tab the morning of chemotherapy, every 3 weeks, by mouth x 6 cycles (Patient taking differently: Take 8 mg by mouth See admin instructions. Take 2 tabs at the night before and 2 tab the morning of chemotherapy, every 3 weeks, by mouth x 6 cycles) 24 tablet 6   docusate sodium  (COLACE) 100 MG capsule Take 1 capsule (100 mg total) by mouth 2 (two) times daily. 10 capsule 0   glimepiride  (AMARYL ) 2 MG tablet TAKE ONE TABLET BY MOUTH ONCE DAILY BEFORE BREAKFAST (Patient taking differently: Take 2 mg by mouth daily.) 90 tablet 0   hydrocortisone  (ANUSOL -HC) 25 MG suppository Place 1 suppository (25 mg total) rectally 2 (two)  times daily. 12 suppository 0   lidocaine -prilocaine  (EMLA ) cream Apply to affected area once 30 g 3   metroNIDAZOLE  (FLAGYL ) 500 MG tablet Take 1 tablet (500 mg total) by mouth 2 (two) times daily. 14 tablet 0   olmesartan  (BENICAR ) 40 MG tablet Take 1 tablet (40 mg total) by mouth daily. 30 tablet 11   ondansetron  (ZOFRAN ) 8 MG tablet Take 1 tablet (8 mg total) by mouth every 8 (eight) hours as needed for nausea or vomiting. Start on the third day after chemotherapy. 30 tablet 1   polyethylene glycol (MIRALAX  / GLYCOLAX ) 17 g packet Take 17 g by mouth at bedtime. 14 each 0    prochlorperazine  (COMPAZINE ) 10 MG tablet Take 1 tablet (10 mg total) by mouth every 6 (six) hours as needed for nausea or vomiting. 30 tablet 1   senna-docusate (SENOKOT-S) 8.6-50 MG tablet Take 2 tablets by mouth at bedtime. For AFTER surgery, do not take if having diarrhea 30 tablet 0   traMADol  (ULTRAM ) 50 MG tablet Take 1 tablet (50 mg total) by mouth every 6 (six) hours as needed for severe pain (pain score 7-10). 60 tablet 0   aspirin  81 MG chewable tablet Chew 81 mg by mouth daily. (Patient not taking: Reported on 01/14/2023)     latanoprost (XALATAN) 0.005 % ophthalmic solution Place 1 drop into both eyes at bedtime. (Patient not taking: Reported on 01/14/2023)     magic mouthwash (nystatin , lidocaine , diphenhydrAMINE, alum & mag hydroxide) suspension Swish and spit 5 mls by mouth 4 times a day for 7 days (Patient not taking: Reported on 03/10/2023) 160 mL 0   rosuvastatin  (CRESTOR ) 20 MG tablet Take 1 tablet (20 mg total) by mouth daily. (Patient not taking: Reported on 03/10/2023) 90 tablet 3   No current facility-administered medications for this encounter.    Physical Findings: The patient is in no acute distress. Patient is alert and oriented.  height is 5' 3 (1.6 m) and weight is 165 lb 8 oz (75.1 kg). Her temporal temperature is 97.5 F (36.4 C) (abnormal). Her blood pressure is 187/79 (abnormal) and her pulse is 73. Her respiration is 18 and oxygen saturation is 100%. .  No significant changes. Lungs are clear to auscultation bilaterally. Heart has regular rate and rhythm. No palpable cervical, supraclavicular, or axillary adenopathy. Abdomen soft, non-tender, normal bowel sounds.   Lab Findings: Lab Results  Component Value Date   WBC 3.1 (L) 02/25/2023   HGB 8.6 (L) 02/25/2023   HCT 26.4 (L) 02/25/2023   MCV 88.0 02/25/2023   PLT 109 (L) 02/25/2023    Radiographic Findings: No results found.  Impression: The encounter diagnosis was Endometrial cancer (HCC).   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       The patient is doing well overall today and continues to heal from the radiation treatment.    Plan: Patient will continue to chemotherapy under the care of Dr. Lonn. She is scheduled to see her next on 03/18/2023. We will see her back in 2 months for regular follow-up. Patient was encouraged to call back with any questions or concerns in the meantime.    20 minutes of total time was spent for this patient encounter, including preparation, face-to-face counseling with the patient and coordination of care, physical exam, and documentation of the encounter.  ____________________________________   Leeroy Due, PA-C   This document serves as a record of services personally performed by Leeroy Due, PA-C It was created on his behalf by Reymundo Cartwright, a trained medical scribe. The creation of this record is based on the scribe's personal observations and the provider's statements to them. This document has been checked and approved by the attending provider.

## 2023-03-09 NOTE — Progress Notes (Signed)
  Radiation Oncology         [phone] ________________________________  Name: Alice Hickman MRN: [id number]  Date: 03/10/2023  DOB: Jun 12, 1950  End of Treatment Note  Diagnosis: The encounter diagnosis was Endometrial cancer (HCC).   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; MMR normal; >50% myometrial invasion; negative for nodal involvement: s/p hysterectomy, BSO, and nodal biopsies       Cancer Staging  Endometrial cancer [facility]) Staging form: Corpus Uteri - Carcinoma and Carcinosarcoma, AJCC 8th Edition - Pathologic stage from 11/25/2022: FIGO Stage II, calculated as Stage IB (pT1b, pN0, cM0) - Signed by [name], MD on 11/25/2022  Indication for treatment: Curative        Radiation treatment dates: First Treatment Date: 2023-01-18 - Last Treatment Date: 2023-02-07  Site/Dose/Technique/Mode:   Site: Vagina Technique: HDR Ir-192 Mode: Brachytherapy Dose Per Fraction: 6 Gy Prescribed Dose (Delivered / Prescribed): 30 Gy / 30 Gy Prescribed Fxs (Delivered / Prescribed): 5 / 5  Narrative: The patient tolerated radiation treatment relatively well without any significant side effects.  Plan: The patient has completed radiation treatment. The patient will return to radiation oncology clinic for routine followup in one month. I advised them to call or return sooner if they have any questions or concerns related to their recovery or treatment.  -----------------------------------  Lynwood CHARM Nasuti, PhD, MD  This document serves as a record of services personally performed by Lynwood Nasuti, MD. It was created on his behalf by Reymundo Cartwright, a trained medical scribe. The creation of this record is based on the scribe's personal observations and the provider's statements to them. This document has been checked and approved by the attending provider.

## 2023-03-10 ENCOUNTER — Encounter: Payer: Self-pay | Admitting: Radiation Oncology

## 2023-03-10 ENCOUNTER — Ambulatory Visit
Admission: RE | Admit: 2023-03-10 | Discharge: 2023-03-10 | Disposition: A | Discharge status: Home / Self Care | Payer: Medicare HMO | Source: Ambulatory Visit | Attending: Radiation Oncology | Admitting: Radiation Oncology

## 2023-03-10 VITALS — BP 187/79 | HR 73 | Temp 97.5°F | Resp 18 | Ht 63.0 in | Wt 165.5 lb

## 2023-03-10 DIAGNOSIS — Z79899 Other long term (current) drug therapy: Secondary | ICD-10-CM | POA: Insufficient documentation

## 2023-03-10 DIAGNOSIS — Z7982 Long term (current) use of aspirin: Secondary | ICD-10-CM | POA: Diagnosis not present

## 2023-03-10 DIAGNOSIS — C541 Malignant neoplasm of endometrium: Secondary | ICD-10-CM | POA: Insufficient documentation

## 2023-03-10 DIAGNOSIS — Z923 Personal history of irradiation: Secondary | ICD-10-CM | POA: Insufficient documentation

## 2023-03-10 DIAGNOSIS — Z7984 Long term (current) use of oral hypoglycemic drugs: Secondary | ICD-10-CM | POA: Insufficient documentation

## 2023-03-10 NOTE — Progress Notes (Signed)
 Alice Hickman is here today for follow up post radiation to the pelvic.  They completed their radiation on: 02/07/23   Does the patient complain of any of the following:  Pain:No Abdominal bloating: Yes reports increased flatulence  Diarrhea/Constipation: Yes intermittent diarrhea and constipation.  Nausea/Vomiting:  No Vaginal Discharge: No Blood in Urine or Stool: No Urinary Issues (dysuria/incomplete emptying/ incontinence/ increased frequency/urgency): No Does patient report using vaginal dilator 2-3 times a week and/or sexually active 2-3 weeks: Patient given xs+ and s vaginal dilators with instructions and importance of use. Patient and daughter voiced understanding.  Post radiation skin changes: No   Additional comments if applicable:  BP (!) 187/79 (BP Location: Left Arm, Patient Position: Sitting)   Pulse 73   Temp (!) 97.5 F (36.4 C) (Temporal)   Resp 18   Ht 5' 3 (1.6 m)   Wt 165 lb 8 oz (75.1 kg)   SpO2 100%   BMI 29.32 kg/m

## 2023-03-11 ENCOUNTER — Telehealth: Payer: Self-pay | Admitting: Radiation Oncology

## 2023-03-11 NOTE — Telephone Encounter (Signed)
 LVM advising of 37M f/u scheduled for 4/17@9 :30am.

## 2023-03-12 ENCOUNTER — Other Ambulatory Visit: Payer: Self-pay

## 2023-03-17 MED FILL — Fosaprepitant Dimeglumine For IV Infusion 150 MG (Base Eq): INTRAVENOUS | Qty: 5 | Status: AC

## 2023-03-18 ENCOUNTER — Inpatient Hospital Stay (HOSPITAL_BASED_OUTPATIENT_CLINIC_OR_DEPARTMENT_OTHER): Payer: Medicare HMO | Admitting: Hematology and Oncology

## 2023-03-18 ENCOUNTER — Encounter: Payer: Self-pay | Admitting: Hematology and Oncology

## 2023-03-18 ENCOUNTER — Inpatient Hospital Stay: Payer: Medicare HMO | Attending: Gynecologic Oncology

## 2023-03-18 ENCOUNTER — Inpatient Hospital Stay: Payer: Medicare HMO

## 2023-03-18 VITALS — BP 126/53 | HR 68 | Temp 97.5°F | Resp 18 | Ht 63.0 in | Wt 163.8 lb

## 2023-03-18 DIAGNOSIS — Z5111 Encounter for antineoplastic chemotherapy: Secondary | ICD-10-CM | POA: Diagnosis present

## 2023-03-18 DIAGNOSIS — C541 Malignant neoplasm of endometrium: Secondary | ICD-10-CM

## 2023-03-18 DIAGNOSIS — D61818 Other pancytopenia: Secondary | ICD-10-CM | POA: Diagnosis not present

## 2023-03-18 DIAGNOSIS — Z79633 Long term (current) use of mitotic inhibitor: Secondary | ICD-10-CM | POA: Diagnosis not present

## 2023-03-18 DIAGNOSIS — R634 Abnormal weight loss: Secondary | ICD-10-CM | POA: Diagnosis not present

## 2023-03-18 DIAGNOSIS — Z79630 Long term (current) use of alkylating agent: Secondary | ICD-10-CM | POA: Diagnosis not present

## 2023-03-18 LAB — CMP (CANCER CENTER ONLY)
ALT: 12 U/L (ref 0–44)
AST: 15 U/L (ref 15–41)
Albumin: 3.9 g/dL (ref 3.5–5.0)
Alkaline Phosphatase: 56 U/L (ref 38–126)
Anion gap: 7 (ref 5–15)
BUN: 14 mg/dL (ref 8–23)
CO2: 28 mmol/L (ref 22–32)
Calcium: 9.4 mg/dL (ref 8.9–10.3)
Chloride: 108 mmol/L (ref 98–111)
Creatinine: 0.94 mg/dL (ref 0.44–1.00)
GFR, Estimated: >60 mL/min
Glucose, Bld: 225 mg/dL — ABNORMAL HIGH (ref 70–99)
Potassium: 3.6 mmol/L (ref 3.5–5.1)
Sodium: 143 mmol/L (ref 135–145)
Total Bilirubin: 0.6 mg/dL (ref 0.0–1.2)
Total Protein: 7.1 g/dL (ref 6.5–8.1)

## 2023-03-18 LAB — CBC WITH DIFFERENTIAL (CANCER CENTER ONLY)
Abs Immature Granulocytes: 0.01 10*3/uL (ref 0.00–0.07)
Basophils Absolute: 0 10*3/uL (ref 0.0–0.1)
Basophils Relative: 0 %
Eosinophils Absolute: 0 10*3/uL (ref 0.0–0.5)
Eosinophils Relative: 1 %
HCT: 26.6 % — ABNORMAL LOW (ref 36.0–46.0)
Hemoglobin: 8.5 g/dL — ABNORMAL LOW (ref 12.0–15.0)
Immature Granulocytes: 1 %
Lymphocytes Relative: 18 %
Lymphs Abs: 0.4 10*3/uL — ABNORMAL LOW (ref 0.7–4.0)
MCH: 29.2 pg (ref 26.0–34.0)
MCHC: 32 g/dL (ref 30.0–36.0)
MCV: 91.4 fL (ref 80.0–100.0)
Monocytes Absolute: 0.1 10*3/uL (ref 0.1–1.0)
Monocytes Relative: 3 %
Neutro Abs: 1.6 10*3/uL — ABNORMAL LOW (ref 1.7–7.7)
Neutrophils Relative %: 77 %
Platelet Count: 116 10*3/uL — ABNORMAL LOW (ref 150–400)
RBC: 2.91 MIL/uL — ABNORMAL LOW (ref 3.87–5.11)
RDW: 18.5 % — ABNORMAL HIGH (ref 11.5–15.5)
WBC Count: 2.1 10*3/uL — ABNORMAL LOW (ref 4.0–10.5)
nRBC: 0 % (ref 0.0–0.2)

## 2023-03-18 MED ORDER — FOSAPREPITANT DIMEGLUMINE INJECTION 150 MG
150.0000 mg | Freq: Once | INTRAVENOUS | Status: AC
  Administered 2023-03-18: 150 mg via INTRAVENOUS
  Filled 2023-03-18: qty 150

## 2023-03-18 MED ORDER — HEPARIN SOD (PORK) LOCK FLUSH 100 UNIT/ML IV SOLN
500.0000 [IU] | Freq: Once | INTRAVENOUS | Status: AC | PRN
Start: 2023-03-18 — End: 2023-03-18
  Administered 2023-03-18: 500 [IU]

## 2023-03-18 MED ORDER — SODIUM CHLORIDE 0.9 % IV SOLN
345.6000 mg | Freq: Once | INTRAVENOUS | Status: AC
  Administered 2023-03-18: 350 mg via INTRAVENOUS
  Filled 2023-03-18: qty 35

## 2023-03-18 MED ORDER — SODIUM CHLORIDE 0.9% FLUSH
10.0000 mL | INTRAVENOUS | Status: DC | PRN
  Administered 2023-03-18: 10 mL

## 2023-03-18 MED ORDER — CETIRIZINE HCL 10 MG/ML IV SOLN
10.0000 mg | Freq: Once | INTRAVENOUS | Status: AC
  Administered 2023-03-18: 10 mg via INTRAVENOUS
  Filled 2023-03-18: qty 1

## 2023-03-18 MED ORDER — DEXAMETHASONE SODIUM PHOSPHATE 10 MG/ML IJ SOLN
10.0000 mg | Freq: Once | INTRAMUSCULAR | Status: AC
  Administered 2023-03-18: 10 mg via INTRAVENOUS
  Filled 2023-03-18: qty 1

## 2023-03-18 MED ORDER — PALONOSETRON HCL INJECTION 0.25 MG/5ML
0.2500 mg | Freq: Once | INTRAVENOUS | Status: AC
  Administered 2023-03-18: 0.25 mg via INTRAVENOUS
  Filled 2023-03-18: qty 5

## 2023-03-18 MED ORDER — FAMOTIDINE IN NACL 20-0.9 MG/50ML-% IV SOLN
20.0000 mg | Freq: Once | INTRAVENOUS | Status: AC
  Administered 2023-03-18: 20 mg via INTRAVENOUS
  Filled 2023-03-18: qty 50

## 2023-03-18 MED ORDER — SODIUM CHLORIDE 0.9% FLUSH
10.0000 mL | Freq: Once | INTRAVENOUS | Status: AC
  Administered 2023-03-18: 10 mL

## 2023-03-18 MED ORDER — SODIUM CHLORIDE 0.9 % IV SOLN
INTRAVENOUS | Status: DC
Start: 2023-03-18 — End: 2023-03-18

## 2023-03-18 MED ORDER — SODIUM CHLORIDE 0.9 % IV SOLN
140.0000 mg/m2 | Freq: Once | INTRAVENOUS | Status: AC
Start: 2023-03-18 — End: 2023-03-18
  Administered 2023-03-18: 252 mg via INTRAVENOUS
  Filled 2023-03-18: qty 42

## 2023-03-18 NOTE — Progress Notes (Signed)
[facility] OFFICE PROGRESS NOTE  Patient Care Team: Diamantina Providence, FNP as PCP - General (Nurse Practitioner)  ASSESSMENT & PLAN:  Endometrial cancer [facility]) Since recent dose reduction, she denies mucositis or severe side effects anymore She remains mildly pancytopenic but not symptomatic She is getting better control of her constipation She had recent weight loss and I will readjust the dose of her treatment  Pancytopenia, acquired (HCC) She is doing better with recent dose reduction We will continue similar dose reduction as before She does not need transfusion support  Weight loss She had recent unintentional weight loss We discussed importance of frequent small meals  No orders of the defined types were placed in this encounter.   All questions were answered. The patient knows to call the clinic with any problems, questions or concerns. The total time spent in the appointment was 30 minutes encounter with patients including review of chart and various tests results, discussions about plan of care and coordination of care plan   Artis Delay, MD 03/18/2023 4:03 PM  INTERVAL HISTORY: Please see below for problem oriented charting. she returns for chemo follow-up She has lost some weight She states she is eating better Denies peripheral neuropathy No recent bleeding Denies recent infection We discussed test results and future follow-up  REVIEW OF SYSTEMS:   Constitutional: Denies fevers, chills or abnormal weight loss Eyes: Denies blurriness of vision Ears, nose, mouth, throat, and face: Denies mucositis or sore throat Respiratory: Denies cough, dyspnea or wheezes Cardiovascular: Denies palpitation, chest discomfort or lower extremity swelling Gastrointestinal:  Denies nausea, heartburn or change in bowel habits Skin: Denies abnormal skin rashes Lymphatics: Denies new lymphadenopathy or easy bruising Neurological:Denies numbness, tingling or new  weaknesses Behavioral/Psych: Mood is stable, no new changes  All other systems were reviewed with the patient and are negative.  I have reviewed the past medical history, past surgical history, social history and family history with the patient and they are unchanged from previous note.  ALLERGIES:  is allergic to penicillins, lovastatin, and metformin.  MEDICATIONS:  Current Outpatient Medications  Medication Sig Dispense Refill   acetaminophen (TYLENOL) 325 MG tablet Take 2 tablets (650 mg total) by mouth every 6 (six) hours as needed for mild pain (pain score 1-3) (or Fever >/= 101). 20 tablet 0   allopurinol (ZYLOPRIM) 100 MG tablet Take 100 mg by mouth daily.     aspirin 81 MG chewable tablet Chew 81 mg by mouth daily. (Patient not taking: Reported on 01/14/2023)     Colchicine 0.6 MG CAPS Take 0.6 mg by mouth 2 (two) times daily as needed (gout flare pain).     dexamethasone (DECADRON) 4 MG tablet Take 2 tabs at the night before and 2 tab the morning of chemotherapy, every 3 weeks, by mouth x 6 cycles (Patient taking differently: Take 8 mg by mouth See admin instructions. Take 2 tabs at the night before and 2 tab the morning of chemotherapy, every 3 weeks, by mouth x 6 cycles) 24 tablet 6   docusate sodium (COLACE) 100 MG capsule Take 1 capsule (100 mg total) by mouth 2 (two) times daily. 10 capsule 0   glimepiride (AMARYL) 2 MG tablet TAKE ONE TABLET BY MOUTH ONCE DAILY BEFORE BREAKFAST (Patient taking differently: Take 2 mg by mouth daily.) 90 tablet 0   hydrocortisone (ANUSOL-HC) 25 MG suppository Place 1 suppository (25 mg total) rectally 2 (two) times daily. 12 suppository 0   latanoprost (XALATAN) 0.005 % ophthalmic  solution Place 1 drop into both eyes at bedtime. (Patient not taking: Reported on 01/14/2023)     lidocaine-prilocaine (EMLA) cream Apply to affected area once 30 g 3   magic mouthwash (nystatin, lidocaine, diphenhydrAMINE, alum & mag hydroxide) suspension Swish and spit  5 mls by mouth 4 times a day for 7 days (Patient not taking: Reported on 03/10/2023) 160 mL 0   olmesartan (BENICAR) 40 MG tablet Take 1 tablet (40 mg total) by mouth daily. 30 tablet 11   ondansetron (ZOFRAN) 8 MG tablet Take 1 tablet (8 mg total) by mouth every 8 (eight) hours as needed for nausea or vomiting. Start on the third day after chemotherapy. 30 tablet 1   polyethylene glycol (MIRALAX / GLYCOLAX) 17 g packet Take 17 g by mouth at bedtime. 14 each 0   prochlorperazine (COMPAZINE) 10 MG tablet Take 1 tablet (10 mg total) by mouth every 6 (six) hours as needed for nausea or vomiting. 30 tablet 1   rosuvastatin (CRESTOR) 20 MG tablet Take 1 tablet (20 mg total) by mouth daily. (Patient not taking: Reported on 03/10/2023) 90 tablet 3   senna-docusate (SENOKOT-S) 8.6-50 MG tablet Take 2 tablets by mouth at bedtime. For AFTER surgery, do not take if having diarrhea 30 tablet 0   traMADol (ULTRAM) 50 MG tablet Take 1 tablet (50 mg total) by mouth every 6 (six) hours as needed for severe pain (pain score 7-10). 60 tablet 0   No current facility-administered medications for this visit.   Facility-Administered Medications Ordered in Other Visits  Medication Dose Route Frequency Provider Last Rate Last Admin   0.9 %  sodium chloride infusion   Intravenous Continuous Bertis Ruddy, Deasia Chiu, MD 10 mL/hr at 03/18/23 1057 New Bag at 03/18/23 1057   CARBOplatin (PARAPLATIN) 350 mg in sodium chloride 0.9 % 100 mL chemo infusion  350 mg Intravenous Once Bertis Ruddy, Taya Ashbaugh, MD 270 mL/hr at 03/18/23 1545 350 mg at 03/18/23 1545   heparin lock flush 100 unit/mL  500 Units Intracatheter Once PRN Bertis Ruddy, Aryeh Butterfield, MD       sodium chloride flush (NS) 0.9 % injection 10 mL  10 mL Intracatheter PRN Bertis Ruddy, Joleah Kosak, MD        SUMMARY OF ONCOLOGIC HISTORY: Oncology History Overview Note  High grade serous, MMR normal, 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    10/28/2022 Imaging   CT C/A/P: 1. Abnormally thickened endometrium (27 mm thickness), compatible with known primary endometrial malignancy. 2. No evidence of metastatic disease in the chest, abdomen or pelvis. 3. Moderate diffuse colonic diverticulosis. 4. One vessel coronary atherosclerosis. 5.  Aortic Atherosclerosis (ICD10-I70.0).   11/18/2022 Initial Diagnosis   Endometrial cancer (HCC)   11/18/2022 Surgery   Robotic-assisted laparoscopic total hysterectomy with bilateral salpingoophorectomy, SLN biopsy bilaterally, omentectomy, cystoscopy, vaginal repair   Findings: On EUA, enlarged mobile uterus. On intra-abdominal entry, normal upper abodminal survey. Normal omentum, small and large bowel. Uterus 12 cm with 4-5 cm calcified anterior fundal fibroid, mildly bulbous. Normal apeparing bilateral adnexa. Mapping successful to bilateral obturator SLNs, no obvious adenopathy. Right sulcal and right sidewall vaginal lacerations due to vaginal delivery of specimen, repaired. On cystoscopy, intact dome, good efflux from bilateral ureteral orifices.    11/25/2022 Cancer Staging   Staging form: Corpus Uteri - Carcinoma and Carcinosarcoma, AJCC 8th Edition - Pathologic stage from 11/25/2022: FIGO Stage II, calculated as Stage IB (  pT1b, pN0, cM0) - Signed by Artis Delay, MD on 11/25/2022 Stage prefix: Initial diagnosis   12/10/2022 Procedure   Successful placement of a right IJ approach Power Port with ultrasound and fluoroscopic guidance. The catheter is ready for use.   12/17/2022 -  Chemotherapy   Patient is on Treatment Plan : UTERINE Carboplatin AUC 6 + Paclitaxel q21d       PHYSICAL EXAMINATION: ECOG PERFORMANCE STATUS: 1 - Symptomatic but completely ambulatory  Vitals:   03/18/23 1033  BP: (!) 126/53  Pulse: 68  Resp: 18  Temp: (!) 97.5 F (36.4 C)  SpO2: 100%   Filed Weights   03/18/23 1033  Weight: 163 lb 12.8 oz (74.3 kg)     GENERAL:alert, no distress and comfortable  LABORATORY DATA:  I have reviewed the data as listed    Component Value Date/Time   NA 143 03/18/2023 0950   NA 137 12/14/2019 0908   K 3.6 03/18/2023 0950   CL 108 03/18/2023 0950   CO2 28 03/18/2023 0950   GLUCOSE 225 (H) 03/18/2023 0950   BUN 14 03/18/2023 0950   BUN 25 12/14/2019 0908   CREATININE 0.94 03/18/2023 0950   CALCIUM 9.4 03/18/2023 0950   PROT 7.1 03/18/2023 0950   ALBUMIN 3.9 03/18/2023 0950   AST 15 03/18/2023 0950   ALT 12 03/18/2023 0950   ALKPHOS 56 03/18/2023 0950   BILITOT 0.6 03/18/2023 0950   GFRNONAA >60 03/18/2023 0950   GFRAA 52 (L) 12/14/2019 0908    No results found for: "SPEP", "UPEP"  Lab Results  Component Value Date   WBC 2.1 (L) 03/18/2023   NEUTROABS 1.6 (L) 03/18/2023   HGB 8.5 (L) 03/18/2023   HCT 26.6 (L) 03/18/2023   MCV 91.4 03/18/2023   PLT 116 (L) 03/18/2023      Chemistry      Component Value Date/Time   NA 143 03/18/2023 0950   NA 137 12/14/2019 0908   K 3.6 03/18/2023 0950   CL 108 03/18/2023 0950   CO2 28 03/18/2023 0950   BUN 14 03/18/2023 0950   BUN 25 12/14/2019 0908   CREATININE 0.94 03/18/2023 0950      Component Value Date/Time   CALCIUM 9.4 03/18/2023 0950   ALKPHOS 56 03/18/2023 0950   AST 15 03/18/2023 0950   ALT 12 03/18/2023 0950   BILITOT 0.6 03/18/2023 0950

## 2023-03-18 NOTE — Assessment & Plan Note (Signed)
She is doing better with recent dose reduction We will continue similar dose reduction as before She does not need transfusion support

## 2023-03-18 NOTE — Assessment & Plan Note (Signed)
She had recent unintentional weight loss We discussed importance of frequent small meals

## 2023-03-18 NOTE — Patient Instructions (Signed)
[facility]   Discharge Instructions: Thank you for choosing [facility] to provide your oncology and hematology care.   If you have a lab appointment with the Cancer Center, please go directly to the Cancer Center and check in at the registration area.   Wear comfortable clothing and clothing appropriate for easy access to any Portacath or PICC line.   We strive to give you quality time with your provider. You may need to reschedule your appointment if you arrive late (15 or more minutes).  Arriving late affects you and other patients whose appointments are after yours.  Also, if you miss three or more appointments without notifying the office, you may be dismissed from the clinic at the provider's discretion.      For prescription refill requests, have your pharmacy contact our office and allow 72 hours for refills to be completed.    Today you received the following chemotherapy and/or immunotherapy agents: Paclitaxel (Taxol) and Carboplatin       To help prevent nausea and vomiting after your treatment, we encourage you to take your nausea medication as directed.  BELOW ARE SYMPTOMS THAT SHOULD BE REPORTED IMMEDIATELY: *FEVER GREATER THAN 100.4 F (38 C) OR HIGHER *CHILLS OR SWEATING *NAUSEA AND VOMITING THAT IS NOT CONTROLLED WITH YOUR NAUSEA MEDICATION *UNUSUAL SHORTNESS OF BREATH *UNUSUAL BRUISING OR BLEEDING *URINARY PROBLEMS (pain or burning when urinating, or frequent urination) *BOWEL PROBLEMS (unusual diarrhea, constipation, pain near the anus) TENDERNESS IN MOUTH AND THROAT WITH OR WITHOUT PRESENCE OF ULCERS (sore throat, sores in mouth, or a toothache) UNUSUAL RASH, SWELLING OR PAIN  UNUSUAL VAGINAL DISCHARGE OR ITCHING   Items with * indicate a potential emergency and should be followed up as soon as possible or go to the Emergency Department if any problems should occur.  Please show the CHEMOTHERAPY  ALERT CARD or IMMUNOTHERAPY ALERT CARD at check-in to the Emergency Department and triage nurse.  Should you have questions after your visit or need to cancel or reschedule your appointment, please contact [facility]  Dept: [phone]  and follow the prompts.  Office hours are 8:00 a.m. to 4:30 p.m. Monday - Friday. Please note that voicemails left after 4:00 p.m. may not be returned until the following business day.  We are closed weekends and major holidays. You have access to a nurse at all times for urgent questions. Please call the main number to the clinic Dept: [phone] and follow the prompts.   For any non-urgent questions, you may also contact your provider using MyChart. We now offer e-Visits for anyone 27 and older to request care online for non-urgent symptoms. For details visit [email or web address].   Also download the MyChart app! Go to the app store, search "MyChart", open the app, select , and log in with your MyChart username and password.

## 2023-03-18 NOTE — Assessment & Plan Note (Signed)
Since recent dose reduction, she denies mucositis or severe side effects anymore She remains mildly pancytopenic but not symptomatic She is getting better control of her constipation She had recent weight loss and I will readjust the dose of her treatment

## 2023-04-07 MED FILL — Fosaprepitant Dimeglumine For IV Infusion 150 MG (Base Eq): INTRAVENOUS | Qty: 5 | Status: AC

## 2023-04-08 ENCOUNTER — Inpatient Hospital Stay: Payer: Medicare HMO

## 2023-04-08 ENCOUNTER — Inpatient Hospital Stay: Payer: Medicare HMO | Admitting: Dietician

## 2023-04-08 ENCOUNTER — Inpatient Hospital Stay: Payer: Medicare HMO | Attending: Gynecologic Oncology | Admitting: Hematology and Oncology

## 2023-04-08 ENCOUNTER — Encounter: Payer: Self-pay | Admitting: Hematology and Oncology

## 2023-04-08 VITALS — BP 135/49 | HR 85 | Temp 98.0°F | Resp 18 | Ht 63.0 in | Wt 159.0 lb

## 2023-04-08 DIAGNOSIS — Z79630 Long term (current) use of alkylating agent: Secondary | ICD-10-CM | POA: Insufficient documentation

## 2023-04-08 DIAGNOSIS — R634 Abnormal weight loss: Secondary | ICD-10-CM | POA: Diagnosis not present

## 2023-04-08 DIAGNOSIS — C541 Malignant neoplasm of endometrium: Secondary | ICD-10-CM | POA: Insufficient documentation

## 2023-04-08 DIAGNOSIS — Z79633 Long term (current) use of mitotic inhibitor: Secondary | ICD-10-CM | POA: Diagnosis not present

## 2023-04-08 DIAGNOSIS — D61818 Other pancytopenia: Secondary | ICD-10-CM | POA: Insufficient documentation

## 2023-04-08 DIAGNOSIS — Z5111 Encounter for antineoplastic chemotherapy: Secondary | ICD-10-CM | POA: Insufficient documentation

## 2023-04-08 LAB — CMP (CANCER CENTER ONLY)
ALT: 10 U/L (ref 0–44)
AST: 17 U/L (ref 15–41)
Albumin: 4.1 g/dL (ref 3.5–5.0)
Alkaline Phosphatase: 67 U/L (ref 38–126)
Anion gap: 10 (ref 5–15)
BUN: 18 mg/dL (ref 8–23)
CO2: 27 mmol/L (ref 22–32)
Calcium: 8.9 mg/dL (ref 8.9–10.3)
Chloride: 102 mmol/L (ref 98–111)
Creatinine: 1.22 mg/dL — ABNORMAL HIGH (ref 0.44–1.00)
GFR, Estimated: 48 mL/min — ABNORMAL LOW (ref >60)
Glucose, Bld: 276 mg/dL — ABNORMAL HIGH (ref 70–99)
Potassium: 3.2 mmol/L — ABNORMAL LOW (ref 3.5–5.1)
Sodium: 139 mmol/L (ref 135–145)
Total Bilirubin: 0.5 mg/dL (ref 0.0–1.2)
Total Protein: 7.3 g/dL (ref 6.5–8.1)

## 2023-04-08 LAB — CBC WITH DIFFERENTIAL (CANCER CENTER ONLY)
Abs Immature Granulocytes: 0.01 10*3/uL (ref 0.00–0.07)
Basophils Absolute: 0 10*3/uL (ref 0.0–0.1)
Basophils Relative: 0 %
Eosinophils Absolute: 0 10*3/uL (ref 0.0–0.5)
Eosinophils Relative: 1 %
HCT: 24.5 % — ABNORMAL LOW (ref 36.0–46.0)
Hemoglobin: 7.7 g/dL — ABNORMAL LOW (ref 12.0–15.0)
Immature Granulocytes: 0 %
Lymphocytes Relative: 19 %
Lymphs Abs: 0.5 10*3/uL — ABNORMAL LOW (ref 0.7–4.0)
MCH: 29.1 pg (ref 26.0–34.0)
MCHC: 31.4 g/dL (ref 30.0–36.0)
MCV: 92.5 fL (ref 80.0–100.0)
Monocytes Absolute: 0.2 10*3/uL (ref 0.1–1.0)
Monocytes Relative: 6 %
Neutro Abs: 1.9 10*3/uL (ref 1.7–7.7)
Neutrophils Relative %: 74 %
Platelet Count: 75 10*3/uL — ABNORMAL LOW (ref 150–400)
RBC: 2.65 MIL/uL — ABNORMAL LOW (ref 3.87–5.11)
RDW: 17 % — ABNORMAL HIGH (ref 11.5–15.5)
WBC Count: 2.6 10*3/uL — ABNORMAL LOW (ref 4.0–10.5)
nRBC: 0 % (ref 0.0–0.2)

## 2023-04-08 MED ORDER — HEPARIN SOD (PORK) LOCK FLUSH 100 UNIT/ML IV SOLN
500.0000 [IU] | Freq: Once | INTRAVENOUS | Status: AC
Start: 2023-04-08 — End: 2023-04-08
  Administered 2023-04-08: 500 [IU]

## 2023-04-08 MED ORDER — GLIMEPIRIDE 2 MG PO TABS: 2.0000 mg | ORAL_TABLET | Freq: Every day | ORAL | 0 refills | Status: AC

## 2023-04-08 MED ORDER — SODIUM CHLORIDE 0.9% FLUSH
10.0000 mL | Freq: Once | INTRAVENOUS | Status: AC
Start: 2023-04-08 — End: 2023-04-08
  Administered 2023-04-08: 10 mL

## 2023-04-08 MED ORDER — COLCHICINE 0.6 MG PO CAPS
0.6000 mg | ORAL_CAPSULE | Freq: Two times a day (BID) | ORAL | 0 refills | Status: AC | PRN
Start: 2023-04-08 — End: ?

## 2023-04-08 NOTE — Assessment & Plan Note (Addendum)
 She had recent unintentional weight loss We discussed importance of frequent small meals

## 2023-04-08 NOTE — Assessment & Plan Note (Addendum)
 The patient was diagnosed with stage II endometrial cancer status post surgery Pathology: High grade serous, MMR normal, Her2 negative   She returns today for final cycle of chemotherapy Unfortunately, she has developed severe pancytopenia and weight loss We will defer treatment until next week to allow bone marrow recovery We discussed risk and benefits of transfusion support and the patient declined today After completion of treatment next week, she will return next month for repeat blood work and imaging study at the conclusion of her treatment

## 2023-04-08 NOTE — Progress Notes (Signed)
 [facility] OFFICE PROGRESS NOTE  Patient Care Team: Diamantina Providence, FNP as PCP - General (Nurse Practitioner)  Assessment & Plan Endometrial cancer [facility]) The patient was diagnosed with stage II endometrial cancer status post surgery Pathology: High grade serous, MMR normal, Her2 negative   She returns today for final cycle of chemotherapy Unfortunately, she has developed severe pancytopenia and weight loss We will defer treatment until next week to allow bone marrow recovery We discussed risk and benefits of transfusion support and the patient declined today After completion of treatment next week, she will return next month for repeat blood work and imaging study at the conclusion of her treatment Pancytopenia, acquired (HCC) Despite multiple dose reduction, she continues to have severe pancytopenia The cause of her pancytopenia is due to chemotherapy B12 level was checked in December and that came back normal We will defer treatment We discussed risk and benefits of transfusion support but the patient declined Weight loss She had recent unintentional weight loss We discussed importance of frequent small meals  Orders Placed This Encounter  Procedures   CT ABDOMEN PELVIS W CONTRAST    Standing Status:   Future    Expected Date:   05/16/2023    Expiration Date:   04/07/2024    If indicated for the ordered procedure, I authorize the administration of contrast media per Radiology protocol:   Yes    Does the patient have a contrast media/X-ray dye allergy?:   No    Preferred imaging location?:   [facility]    If indicated for the ordered procedure, I authorize the administration of oral contrast media per Radiology protocol:   Yes   CBC with Differential (Cancer Center Only)    Standing Status:   Future    Expected Date:   04/15/2023    Expiration Date:   04/14/2024   CMP (Cancer Center only)    Standing Status:   Future    Expected Date:   04/15/2023     Expiration Date:   04/14/2024     Artis Delay, MD  INTERVAL HISTORY: she returns for treatment follow-up Complications related to previous cycle of chemotherapy included pancytopenia,, constipation,, weight loss,, and peripheral neuropathy, She denies recent bleeding The patient claims she is eating well despite progressive weight loss She denies worsening constipation, laxatives are working No worsening peripheral neuropathy We discussed test results today and the rationale behind deferring treatment and plan for future imaging study  PHYSICAL EXAMINATION: ECOG PERFORMANCE STATUS: 1 - Symptomatic but completely ambulatory  Vitals:   04/08/23 0827  BP: (!) 135/49  Pulse: 85  Resp: 18  Temp: 98 F (36.7 C)  SpO2: 100%   Filed Weights   04/08/23 0827  Weight: 159 lb (72.1 kg)    Relevant data reviewed during this visit included CBC and CMP

## 2023-04-08 NOTE — Assessment & Plan Note (Addendum)
 Despite multiple dose reduction, she continues to have severe pancytopenia The cause of her pancytopenia is due to chemotherapy B12 level was checked in December and that came back normal We will defer treatment We discussed risk and benefits of transfusion support but the patient declined

## 2023-04-09 ENCOUNTER — Other Ambulatory Visit: Payer: Self-pay

## 2023-04-14 MED FILL — Fosaprepitant Dimeglumine For IV Infusion 150 MG (Base Eq): INTRAVENOUS | Qty: 5 | Status: AC

## 2023-04-15 ENCOUNTER — Other Ambulatory Visit: Payer: Self-pay

## 2023-04-15 ENCOUNTER — Inpatient Hospital Stay (HOSPITAL_BASED_OUTPATIENT_CLINIC_OR_DEPARTMENT_OTHER): Admitting: Hematology and Oncology

## 2023-04-15 ENCOUNTER — Inpatient Hospital Stay: Admitting: Dietician

## 2023-04-15 ENCOUNTER — Inpatient Hospital Stay

## 2023-04-15 ENCOUNTER — Encounter: Payer: Self-pay | Admitting: Hematology and Oncology

## 2023-04-15 VITALS — BP 126/60 | HR 63 | Temp 97.7°F | Resp 16 | Wt 162.2 lb

## 2023-04-15 DIAGNOSIS — Z5111 Encounter for antineoplastic chemotherapy: Secondary | ICD-10-CM | POA: Diagnosis not present

## 2023-04-15 DIAGNOSIS — D61818 Other pancytopenia: Secondary | ICD-10-CM

## 2023-04-15 DIAGNOSIS — C541 Malignant neoplasm of endometrium: Secondary | ICD-10-CM

## 2023-04-15 LAB — CBC WITH DIFFERENTIAL (CANCER CENTER ONLY)
Abs Immature Granulocytes: 0.02 10*3/uL (ref 0.00–0.07)
Basophils Absolute: 0 10*3/uL (ref 0.0–0.1)
Basophils Relative: 0 %
Eosinophils Absolute: 0 10*3/uL (ref 0.0–0.5)
Eosinophils Relative: 0 %
HCT: 24.9 % — ABNORMAL LOW (ref 36.0–46.0)
Hemoglobin: 7.8 g/dL — ABNORMAL LOW (ref 12.0–15.0)
Immature Granulocytes: 1 %
Lymphocytes Relative: 20 %
Lymphs Abs: 0.5 10*3/uL — ABNORMAL LOW (ref 0.7–4.0)
MCH: 29.8 pg (ref 26.0–34.0)
MCHC: 31.3 g/dL (ref 30.0–36.0)
MCV: 95 fL (ref 80.0–100.0)
Monocytes Absolute: 0.1 10*3/uL (ref 0.1–1.0)
Monocytes Relative: 3 %
Neutro Abs: 2 10*3/uL (ref 1.7–7.7)
Neutrophils Relative %: 76 %
Platelet Count: 113 10*3/uL — ABNORMAL LOW (ref 150–400)
RBC: 2.62 MIL/uL — ABNORMAL LOW (ref 3.87–5.11)
RDW: 17.6 % — ABNORMAL HIGH (ref 11.5–15.5)
WBC Count: 2.6 10*3/uL — ABNORMAL LOW (ref 4.0–10.5)
nRBC: 0 % (ref 0.0–0.2)

## 2023-04-15 LAB — CMP (CANCER CENTER ONLY)
ALT: 10 U/L (ref 0–44)
AST: 14 U/L — ABNORMAL LOW (ref 15–41)
Albumin: 3.8 g/dL (ref 3.5–5.0)
Alkaline Phosphatase: 54 U/L (ref 38–126)
Anion gap: 6 (ref 5–15)
BUN: 14 mg/dL (ref 8–23)
CO2: 28 mmol/L (ref 22–32)
Calcium: 8.9 mg/dL (ref 8.9–10.3)
Chloride: 109 mmol/L (ref 98–111)
Creatinine: 0.78 mg/dL (ref 0.44–1.00)
GFR, Estimated: >60 mL/min (ref >60)
Glucose, Bld: 178 mg/dL — ABNORMAL HIGH (ref 70–99)
Potassium: 3.4 mmol/L — ABNORMAL LOW (ref 3.5–5.1)
Sodium: 143 mmol/L (ref 135–145)
Total Bilirubin: 0.4 mg/dL (ref 0.0–1.2)
Total Protein: 6.7 g/dL (ref 6.5–8.1)

## 2023-04-15 LAB — PREPARE RBC (CROSSMATCH)

## 2023-04-15 MED ORDER — SODIUM CHLORIDE 0.9% IV SOLUTION: 250.0000 mL | INTRAVENOUS | Status: DC

## 2023-04-15 MED ORDER — SODIUM CHLORIDE 0.9 % IV SOLN: INTRAVENOUS | Status: DC

## 2023-04-15 MED ORDER — PALONOSETRON HCL INJECTION 0.25 MG/5ML
0.2500 mg | Freq: Once | INTRAVENOUS | Status: AC
  Administered 2023-04-15: 0.25 mg via INTRAVENOUS
  Filled 2023-04-15: qty 5

## 2023-04-15 MED ORDER — CETIRIZINE HCL 10 MG/ML IV SOLN
10.0000 mg | Freq: Once | INTRAVENOUS | Status: AC
  Administered 2023-04-15: 10 mg via INTRAVENOUS
  Filled 2023-04-15: qty 1

## 2023-04-15 MED ORDER — SODIUM CHLORIDE 0.9 % IV SOLN
140.0000 mg/m2 | Freq: Once | INTRAVENOUS | Status: AC
  Administered 2023-04-15: 252 mg via INTRAVENOUS
  Filled 2023-04-15: qty 42

## 2023-04-15 MED ORDER — HEPARIN SOD (PORK) LOCK FLUSH 100 UNIT/ML IV SOLN
500.0000 [IU] | Freq: Once | INTRAVENOUS | Status: AC | PRN
  Administered 2023-04-15: 500 [IU]

## 2023-04-15 MED ORDER — SODIUM CHLORIDE 0.9% FLUSH
10.0000 mL | Freq: Once | INTRAVENOUS | Status: AC
  Administered 2023-04-15: 10 mL

## 2023-04-15 MED ORDER — SODIUM CHLORIDE 0.9 % IV SOLN
338.4000 mg | Freq: Once | INTRAVENOUS | Status: AC
  Administered 2023-04-15: 340 mg via INTRAVENOUS
  Filled 2023-04-15: qty 34

## 2023-04-15 MED ORDER — SODIUM CHLORIDE 0.9 % IV SOLN
150.0000 mg | Freq: Once | INTRAVENOUS | Status: AC
  Administered 2023-04-15: 150 mg via INTRAVENOUS
  Filled 2023-04-15: qty 150
  Filled 2023-04-15: qty 5

## 2023-04-15 MED ORDER — DEXAMETHASONE SODIUM PHOSPHATE 10 MG/ML IJ SOLN
10.0000 mg | Freq: Once | INTRAMUSCULAR | Status: AC
Start: 2023-04-15 — End: 2023-04-15
  Administered 2023-04-15: 10 mg via INTRAVENOUS
  Filled 2023-04-15: qty 1

## 2023-04-15 MED ORDER — FAMOTIDINE IN NACL 20-0.9 MG/50ML-% IV SOLN
20.0000 mg | Freq: Once | INTRAVENOUS | Status: AC
  Administered 2023-04-15: 20 mg via INTRAVENOUS
  Filled 2023-04-15: qty 50

## 2023-04-15 MED ORDER — SODIUM CHLORIDE 0.9% FLUSH
10.0000 mL | INTRAVENOUS | Status: DC | PRN
  Administered 2023-04-15: 10 mL

## 2023-04-15 NOTE — Patient Instructions (Addendum)
 [facility]   Discharge Instructions: Thank you for choosing [facility] to provide your oncology and hematology care.   If you have a lab appointment with the Cancer Center, please go directly to the Cancer Center and check in at the registration area.   Wear comfortable clothing and clothing appropriate for easy access to any Portacath or PICC line.   We strive to give you quality time with your provider. You may need to reschedule your appointment if you arrive late (15 or more minutes).  Arriving late affects you and other patients whose appointments are after yours.  Also, if you miss three or more appointments without notifying the office, you may be dismissed from the clinic at the provider's discretion.      For prescription refill requests, have your pharmacy contact our office and allow 72 hours for refills to be completed.    Today you received the following chemotherapy and/or immunotherapy agents: Paclitaxel (Taxol) and Carboplatin       To help prevent nausea and vomiting after your treatment, we encourage you to take your nausea medication as directed.  BELOW ARE SYMPTOMS THAT SHOULD BE REPORTED IMMEDIATELY: *FEVER GREATER THAN 100.4 F (38 C) OR HIGHER *CHILLS OR SWEATING *NAUSEA AND VOMITING THAT IS NOT CONTROLLED WITH YOUR NAUSEA MEDICATION *UNUSUAL SHORTNESS OF BREATH *UNUSUAL BRUISING OR BLEEDING *URINARY PROBLEMS (pain or burning when urinating, or frequent urination) *BOWEL PROBLEMS (unusual diarrhea, constipation, pain near the anus) TENDERNESS IN MOUTH AND THROAT WITH OR WITHOUT PRESENCE OF ULCERS (sore throat, sores in mouth, or a toothache) UNUSUAL RASH, SWELLING OR PAIN  UNUSUAL VAGINAL DISCHARGE OR ITCHING   Items with * indicate a potential emergency and should be followed up as soon as possible or go to the Emergency Department if any problems should occur.  Please show the CHEMOTHERAPY  ALERT CARD or IMMUNOTHERAPY ALERT CARD at check-in to the Emergency Department and triage nurse.  Should you have questions after your visit or need to cancel or reschedule your appointment, please contact [facility]  Dept: [phone]  and follow the prompts.  Office hours are 8:00 a.m. to 4:30 p.m. Monday - Friday. Please note that voicemails left after 4:00 p.m. may not be returned until the following business day.  We are closed weekends and major holidays. You have access to a nurse at all times for urgent questions. Please call the main number to the clinic Dept: [phone] and follow the prompts.   For any non-urgent questions, you may also contact your provider using MyChart. We now offer e-Visits for anyone 90 and older to request care online for non-urgent symptoms. For details visit [email or web address].   Also download the MyChart app! Go to the app store, search "MyChart", open the app, select [place], and log in with your MyChart username and password.  Blood Transfusion, Adult, Care After The following information offers guidance on how to care for yourself after your procedure. Your health care provider may also give you more specific instructions. If you have problems or questions, contact your health care provider. What can I expect after the procedure? After the procedure, it is common to have: Bruising and soreness where the IV was inserted. A headache. Follow these instructions at home: IV insertion site care     Follow instructions from your health care provider about  how to take care of your IV insertion site. Make sure you: Wash your hands with soap and water for at least 20 seconds before and after you change your bandage (dressing). If soap and water are not available, use hand sanitizer. Change your dressing as told by your health care provider. Check your IV insertion site every day for signs of  infection. Check for: Redness, swelling, or pain. Bleeding from the site. Warmth. Pus or a bad smell. General instructions Take over-the-counter and prescription medicines only as told by your health care provider. Rest as told by your health care provider. Return to your normal activities as told by your health care provider. Keep all follow-up visits. Lab tests may need to be done at certain periods to recheck your blood counts. Contact a health care provider if: You have itching or red, swollen areas of skin (hives). You have a fever or chills. You have pain in the head, back, or chest. You feel anxious or you feel weak after doing your normal activities. You have redness, swelling, warmth, or pain around the IV insertion site. You have blood coming from the IV insertion site that does not stop with pressure. You have pus or a bad smell coming from your IV insertion site. If you received your blood transfusion in an outpatient setting, you will be told whom to contact to report any reactions. Get help right away if: You have symptoms of a serious allergic or immune system reaction, including: Trouble breathing or shortness of breath. Swelling of the face, feeling flushed, or widespread rash. Dark urine or blood in the urine. Fast heartbeat. These symptoms may be an emergency. Get help right away. Call 911. Do not wait to see if the symptoms will go away. Do not drive yourself to the hospital. Summary Bruising and soreness around the IV insertion site are common. Check your IV insertion site every day for signs of infection. Rest as told by your health care provider. Return to your normal activities as told by your health care provider. Get help right away for symptoms of a serious allergic or immune system reaction to the blood transfusion. This information is not intended to replace advice given to you by your health care provider. Make sure you discuss any questions you have  with your health care provider. Document Revised: 04/17/2021 Document Reviewed: 04/17/2021 Elsevier Patient Education  2024 [employer].

## 2023-04-15 NOTE — Progress Notes (Signed)
 [facility] OFFICE PROGRESS NOTE  Patient Care Team: Diamantina Providence, FNP as PCP - General (Nurse Practitioner)  Assessment & Plan Pancytopenia, acquired [facility]) We discussed some of the risks, benefits, and alternatives of blood transfusions. The patient is symptomatic from anemia and the hemoglobin level is critically low.  Some of the side-effects to be expected including risks of transfusion reactions, chills, infection, syndrome of volume overload and risk of hospitalization from various reasons and the patient is willing to proceed and went ahead to sign consent today.  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: High grade serous, MMR normal, Her2 negative   She returns today for final cycle of chemotherapy Despite delaying her treatment by 1 week, she remained persistently anemic although thrombocytopenia has improved Leukopenia is stable We discussed risk and benefits of transfusion support with chemotherapy and she ultimately agreed to proceed  Orders Placed This Encounter  Procedures   Informed Consent Details: Physician/Practitioner Attestation; Transcribe to consent form and obtain patient signature    Standing Status:   Future    Expected Date:   04/15/2023    Expiration Date:   04/14/2024    Physician/Practitioner attestation of informed consent for blood and or blood product transfusion:   I, the physician/practitioner, attest that I have discussed with the patient the benefits, risks, side effects, alternatives, likelihood of achieving goals and potential problems during recovery for the procedure that I have provided informed consent.    Product(s):   All Product(s)   Care order/instruction    Transfuse Parameters    Standing Status:   Future    Expiration Date:   04/14/2024   Type and screen         Standing Status:   Future    Number of Occurrences:   1    Expected Date:    04/15/2023    Expiration Date:   04/14/2024     Artis Delay, MD  INTERVAL HISTORY: she returns for treatment follow-up Complications related to previous cycle of chemotherapy included pancytopenia, We discussed risk, benefits, side effects of blood transfusion and chemotherapy today and she agreed for both  PHYSICAL EXAMINATION: ECOG PERFORMANCE STATUS: 1 - Symptomatic but completely ambulatory  There were no vitals filed for this visit. There were no vitals filed for this visit.  Relevant data reviewed during this visit included CBC and CMP

## 2023-04-15 NOTE — Assessment & Plan Note (Addendum)
 The patient was diagnosed with stage II endometrial cancer status post surgery Pathology: High grade serous, MMR normal, Her2 negative   She returns today for final cycle of chemotherapy Despite delaying her treatment by 1 week, she remained persistently anemic although thrombocytopenia has improved Leukopenia is stable We discussed risk and benefits of transfusion support with chemotherapy and she ultimately agreed to proceed

## 2023-04-15 NOTE — Progress Notes (Signed)
 Dr. Bertis Ruddy aware that patient was only able to take one of her steroid pills this morning. IV dexamethasone to be given during tx.- no need for any more per MD.

## 2023-04-15 NOTE — Assessment & Plan Note (Addendum)
We discussed some of the risks, benefits, and alternatives of blood transfusions. The patient is symptomatic from anemia and the hemoglobin level is critically low.  Some of the side-effects to be expected including risks of transfusion reactions, chills, infection, syndrome of volume overload and risk of hospitalization from various reasons and the patient is willing to proceed and went ahead to sign consent today.  

## 2023-04-15 NOTE — Progress Notes (Signed)
 Nutrition Assessment  Reason for Assessment: +MST (wt loss)   ASSESSMENT: 73 year old female stage II endometrial cancer s/p resection (11/18/22) under the care of Dr. Pricilla Holm. Patient receiving adjuvant chemotherapy with carboplatin + paclitaxel q21d. Patient is under the care of Dr. Bertis Ruddy  Past medical history includes HTN, cardiomegaly, GERD, chronic constipation, DM with neuropathy, carpal tunnel syndrome, osteopenia, HLD, anxiety, gout  Met with patient in infusion. She is coloring at time of visit. Daughter is present. Today is pt final chemotherapy. Patient reports appetite is slowly improving. She has altered taste which has affected daily intake. Patient reports eating 3 meals and snacking. Recalls bacon and eggs for breakfast. Had deviled egg sandwich for lunch. Recalls [place] wings, greens, sweet potato cornbread for dinner. Pt likes to snack on cookies, cake, graham crackers per daughter. She endorses drinking a lot of water. Pt adds crystal lite packet for flavoring on occasion. Patient reports chronic constipation s/p surgery. She is on bowel regimen. Patient denies nausea, vomiting, diarrhea.   Nutrition Focused Physical Exam: deferred    Medications: zyloprim, decadron, glimepiride, MMW, compazine, crestor, senna-s, tramadol, zofran   Labs: K 3.4, glucose 178, Hgb 7.8   Anthropometrics: Weights have increased from 159 lb on 3/7  Height: 5'3" Weight: 162 lb 4 oz UBW: 170-175 lb (January) BMI: 28.74    NUTRITION DIAGNOSIS: Unintended wt loss related to cancer and associated treatment side effects as evidenced by altered taste, 7% decrease from usual weight in 8 weeks which is severe for time frame   INTERVENTION:  Educated on strategies for altered taste, suggested trying baking soda salt water rinses prior to intake - handout with tips + recipe provided Continue strategies for increasing calories and protein with small frequent meals/snacks - handout with ideas  provided  Educated on foods with protein, recommend protein source at every meal/snack - list of foods provided  Bowel regimen per MD  Contact information provided    MONITORING, EVALUATION, GOAL: Pt will tolerate increased calories and protein to minimize further wt loss    Next Visit: To be scheduled as needed

## 2023-04-18 LAB — BPAM RBC
Blood Product Expiration Date: 202504092359
ISSUE DATE / TIME: 202503141419
Unit Type and Rh: 5100

## 2023-04-18 LAB — TYPE AND SCREEN
ABO/RH(D): O POS
Antibody Screen: NEGATIVE
Unit division: 0

## 2023-04-27 ENCOUNTER — Ambulatory Visit: Payer: Medicare HMO | Attending: Cardiology | Admitting: Cardiology

## 2023-05-11 NOTE — Assessment & Plan Note (Addendum)
 The patient was diagnosed with stage II endometrial cancer status post surgery, completed adjuvant chemotherapy in March 2025 Pathology: High grade serous, MMR normal, Her2 negative   She has completed chemotherapy recently, complicated by severe pancytopenia She is still recovering from side effects of treatment I reviewed imaging study which showed no evidence of disease She has appointment pending to see radiation oncologist in the future I will see her in 3 months with repeat labs We discussed port removal and she agreed

## 2023-05-16 ENCOUNTER — Other Ambulatory Visit: Payer: Self-pay

## 2023-05-16 ENCOUNTER — Ambulatory Visit (HOSPITAL_COMMUNITY)
Admission: RE | Admit: 2023-05-16 | Discharge: 2023-05-16 | Disposition: A | Discharge status: Home / Self Care | Source: Ambulatory Visit | Attending: Hematology and Oncology | Admitting: Hematology and Oncology

## 2023-05-16 ENCOUNTER — Inpatient Hospital Stay: Attending: Gynecologic Oncology

## 2023-05-16 DIAGNOSIS — T451X5A Adverse effect of antineoplastic and immunosuppressive drugs, initial encounter: Secondary | ICD-10-CM | POA: Insufficient documentation

## 2023-05-16 DIAGNOSIS — D61810 Antineoplastic chemotherapy induced pancytopenia: Secondary | ICD-10-CM | POA: Insufficient documentation

## 2023-05-16 DIAGNOSIS — C541 Malignant neoplasm of endometrium: Secondary | ICD-10-CM | POA: Diagnosis present

## 2023-05-16 LAB — CBC WITH DIFFERENTIAL (CANCER CENTER ONLY)
Abs Immature Granulocytes: 0.02 10*3/uL (ref 0.00–0.07)
Basophils Absolute: 0 10*3/uL (ref 0.0–0.1)
Basophils Relative: 0 %
Eosinophils Absolute: 0.1 10*3/uL (ref 0.0–0.5)
Eosinophils Relative: 4 %
HCT: 26.6 % — ABNORMAL LOW (ref 36.0–46.0)
Hemoglobin: 8.4 g/dL — ABNORMAL LOW (ref 12.0–15.0)
Immature Granulocytes: 1 %
Lymphocytes Relative: 33 %
Lymphs Abs: 1.1 10*3/uL (ref 0.7–4.0)
MCH: 29.2 pg (ref 26.0–34.0)
MCHC: 31.6 g/dL (ref 30.0–36.0)
MCV: 92.4 fL (ref 80.0–100.0)
Monocytes Absolute: 0.2 10*3/uL (ref 0.1–1.0)
Monocytes Relative: 7 %
Neutro Abs: 1.8 10*3/uL (ref 1.7–7.7)
Neutrophils Relative %: 55 %
Platelet Count: 51 10*3/uL — ABNORMAL LOW (ref 150–400)
RBC: 2.88 MIL/uL — ABNORMAL LOW (ref 3.87–5.11)
RDW: 17.5 % — ABNORMAL HIGH (ref 11.5–15.5)
WBC Count: 3.3 10*3/uL — ABNORMAL LOW (ref 4.0–10.5)
nRBC: 0 % (ref 0.0–0.2)

## 2023-05-16 LAB — CMP (CANCER CENTER ONLY)
ALT: 7 U/L (ref 0–44)
AST: 12 U/L — ABNORMAL LOW (ref 15–41)
Albumin: 3.7 g/dL (ref 3.5–5.0)
Alkaline Phosphatase: 59 U/L (ref 38–126)
Anion gap: 5 (ref 5–15)
BUN: 11 mg/dL (ref 8–23)
CO2: 29 mmol/L (ref 22–32)
Calcium: 9.2 mg/dL (ref 8.9–10.3)
Chloride: 110 mmol/L (ref 98–111)
Creatinine: 0.96 mg/dL (ref 0.44–1.00)
GFR, Estimated: >60 mL/min (ref >60)
Glucose, Bld: 132 mg/dL — ABNORMAL HIGH (ref 70–99)
Potassium: 3.5 mmol/L (ref 3.5–5.1)
Sodium: 144 mmol/L (ref 135–145)
Total Bilirubin: 0.3 mg/dL (ref 0.0–1.2)
Total Protein: 6.7 g/dL (ref 6.5–8.1)

## 2023-05-16 MED ORDER — SODIUM CHLORIDE 0.9% FLUSH
10.0000 mL | Freq: Once | INTRAVENOUS | Status: AC
  Administered 2023-05-16: 10 mL

## 2023-05-16 MED ORDER — HEPARIN SOD (PORK) LOCK FLUSH 100 UNIT/ML IV SOLN
INTRAVENOUS | Status: AC
  Filled 2023-05-16: qty 5

## 2023-05-16 MED ORDER — IOHEXOL 300 MG/ML  SOLN
100.0000 mL | Freq: Once | INTRAMUSCULAR | Status: AC | PRN
  Administered 2023-05-16: 100 mL via INTRAVENOUS

## 2023-05-16 MED ORDER — SODIUM CHLORIDE (PF) 0.9 % IJ SOLN
INTRAMUSCULAR | Status: AC
  Filled 2023-05-16: qty 50

## 2023-05-16 MED ORDER — HEPARIN SOD (PORK) LOCK FLUSH 100 UNIT/ML IV SOLN
500.0000 [IU] | Freq: Once | INTRAVENOUS | Status: AC
  Administered 2023-05-16: 500 [IU] via INTRAVENOUS

## 2023-05-18 NOTE — Progress Notes (Signed)
 Alice Hickman is here today for follow up post radiation to the pelvic.  They completed their radiation on: 2023-02-07   Does the patient complain of any of the following:  Pain:Denies any  Abdominal bloating: denies Diarrhea/Constipation: denies Nausea/Vomiting: denies Vaginal Discharge: denies Blood in Urine or Stool: denies Urinary Issues (dysuria/incomplete emptying/ incontinence/ increased frequency/urgency): denies Does patient report using vaginal dilator 2-3 times a week and/or sexually active 2-3 weeks: States that she uses them sometime but not often. Post radiation skin changes: denies   Additional comments if applicable:  Vitals:   05/19/23 0948  BP: (!) 157/68  Pulse: 87  Resp: 18  Temp: (!) 97.5 F (36.4 C)  TempSrc: Temporal  SpO2: 100%  Weight: 158 lb 4 oz (71.8 kg)  Height: 5\' 3"  (1.6 m)

## 2023-05-18 NOTE — Progress Notes (Signed)
 Radiation Oncology         ([phone]) [phone] ________________________________  Name: Alice Hickman MRN: [id number]  Date: 05/19/2023  DOB: [birth year]  Follow-Up Visit Note  CC: Bari Boos, FNP  Bari Boos, FNP  No diagnosis found.  Diagnosis:  The encounter diagnosis was Endometrial cancer (HCC).   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; MMR normal; >50% myometrial invasion; negative for nodal involvement: s/p hysterectomy, BSO, and nodal biopsies        Cancer Staging  Endometrial cancer [facility]) Staging form: Corpus Uteri - Carcinoma and Carcinosarcoma, AJCC 8th Edition - Pathologic stage from 11/25/2022: FIGO Stage II, calculated as Stage IB (pT1b, pN0, cM0) - Signed by Almeda Jacobs, MD on 11/25/2022   Indication for treatment: Curative         Interval Since Last Radiation:  3 month 10 day   Radiation treatment dates:  First Treatment Date: 2023-01-18 - Last Treatment Date: 2023-02-07   Site/Dose/Technique/Mode:    Site: Vagina Technique: HDR Ir-192 Mode: Brachytherapy Dose Per Fraction: 6 Gy Prescribed Dose (Delivered / Prescribed): 30 Gy / 30 Gy Prescribed Fxs (Delivered / Prescribed): 5 / 5   Narrative:  The patient returns today for routine follow-up. She was last seen in office on 03/10/23 for a follow- up. Patient continued to follow up with their specialists to manage their chronic conditions.   She presented for a follow up with Dr. Marton Sleeper on 04/08/23 for her final cycle of chemotherapy; however, due to severe pancytopenia and weight loss, treatment was deferred to the following week. Patient also declined transfusions at that time.    During her most recent visit with Dr. Marton Sleeper on 04/15/23, she proceeded with her final cycle of chemotherapy. At that time, she agreed to under go iron infusions as her hemoglobin levels were critically low.                          CT a/p preformed on 05/16/23 showing ***  No  other significant oncologic interval history since the patient was last seen.    Allergies:  is allergic to penicillins, lovastatin, and metformin.  Meds: Current Outpatient Medications  Medication Sig Dispense Refill   acetaminophen (TYLENOL) 325 MG tablet Take 2 tablets (650 mg total) by mouth every 6 (six) hours as needed for mild pain (pain score 1-3) (or Fever >/= 101). 20 tablet 0   allopurinol (ZYLOPRIM) 100 MG tablet Take 100 mg by mouth daily.     aspirin 81 MG chewable tablet Chew 81 mg by mouth daily. (Patient not taking: Reported on 01/14/2023)     Colchicine 0.6 MG CAPS Take 1 capsule (0.6 mg total) by mouth 2 (two) times daily as needed (gout flare pain). 30 capsule 0   dexamethasone (DECADRON) 4 MG tablet Take 2 tabs at the night before and 2 tab the morning of chemotherapy, every 3 weeks, by mouth x 6 cycles (Patient taking differently: Take 8 mg by mouth See admin instructions. Take 2 tabs at the night before and 2 tab the morning of chemotherapy, every 3 weeks, by mouth x 6 cycles) 24 tablet 6   docusate sodium (COLACE) 100 MG capsule Take 1 capsule (100 mg total) by mouth 2 (two) times daily. 10 capsule 0   glimepiride (AMARYL) 2 MG tablet Take 1 tablet (2 mg total) by mouth daily with breakfast. TAKE ONE TABLET BY MOUTH ONCE DAILY BEFORE BREAKFAST  90 tablet 0   latanoprost (XALATAN) 0.005 % ophthalmic solution Place 1 drop into both eyes at bedtime. (Patient not taking: Reported on 01/14/2023)     lidocaine-prilocaine (EMLA) cream Apply to affected area once 30 g 3   magic mouthwash (nystatin, lidocaine, diphenhydrAMINE, alum & mag hydroxide) suspension Swish and spit 5 mls by mouth 4 times a day for 7 days (Patient not taking: Reported on 03/10/2023) 160 mL 0   olmesartan (BENICAR) 40 MG tablet Take 1 tablet (40 mg total) by mouth daily. 30 tablet 11   ondansetron (ZOFRAN) 8 MG tablet Take 1 tablet (8 mg total) by mouth every 8 (eight) hours as needed for nausea or vomiting.  Start on the third day after chemotherapy. 30 tablet 1   polyethylene glycol (MIRALAX / GLYCOLAX) 17 g packet Take 17 g by mouth at bedtime. 14 each 0   prochlorperazine (COMPAZINE) 10 MG tablet Take 1 tablet (10 mg total) by mouth every 6 (six) hours as needed for nausea or vomiting. 30 tablet 1   rosuvastatin (CRESTOR) 20 MG tablet Take 1 tablet (20 mg total) by mouth daily. (Patient not taking: Reported on 03/10/2023) 90 tablet 3   senna-docusate (SENOKOT-S) 8.6-50 MG tablet Take 2 tablets by mouth at bedtime. For AFTER surgery, do not take if having diarrhea 30 tablet 0   traMADol (ULTRAM) 50 MG tablet Take 1 tablet (50 mg total) by mouth every 6 (six) hours as needed for severe pain (pain score 7-10). 60 tablet 0   No current facility-administered medications for this encounter.    Physical Findings: The patient is in no acute distress. Patient is alert and oriented.  vitals were not taken for this visit. .  No significant changes. Lungs are clear to auscultation bilaterally. Heart has regular rate and rhythm. No palpable cervical, supraclavicular, or axillary adenopathy. Abdomen soft, non-tender, normal bowel sounds.   Lab Findings: Lab Results  Component Value Date   WBC 3.3 (L) 05/16/2023   HGB 8.4 (L) 05/16/2023   HCT 26.6 (L) 05/16/2023   MCV 92.4 05/16/2023   PLT 51 (L) 05/16/2023    Radiographic Findings: No results found.  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  ***  Plan:  ***   *** minutes of total time was spent for this patient encounter, including preparation, face-to-face counseling with the patient and coordination of care, physical exam, and documentation of the encounter. ____________________________________  Noralee Beam, PhD,  MD  This document serves as a record of services personally performed by Retta Caster, MD. It was created on his behalf by Lucky Sable, a trained medical scribe. The creation of this record is based on the scribe's personal observations and the provider's statements to them. This document has been checked and approved by the attending provider.

## 2023-05-19 ENCOUNTER — Encounter: Payer: Self-pay | Admitting: Radiation Oncology

## 2023-05-19 ENCOUNTER — Ambulatory Visit
Admission: RE | Admit: 2023-05-19 | Discharge: 2023-05-19 | Disposition: A | Discharge status: Home / Self Care | Payer: Medicare HMO | Source: Ambulatory Visit | Attending: Radiation Oncology | Admitting: Radiation Oncology

## 2023-05-19 VITALS — BP 157/68 | HR 87 | Temp 97.5°F | Resp 18 | Ht 63.0 in | Wt 158.2 lb

## 2023-05-19 DIAGNOSIS — C541 Malignant neoplasm of endometrium: Secondary | ICD-10-CM

## 2023-05-20 ENCOUNTER — Other Ambulatory Visit: Payer: Self-pay

## 2023-05-23 ENCOUNTER — Encounter: Payer: Self-pay | Admitting: Hematology and Oncology

## 2023-05-23 ENCOUNTER — Inpatient Hospital Stay (HOSPITAL_BASED_OUTPATIENT_CLINIC_OR_DEPARTMENT_OTHER): Admitting: Hematology and Oncology

## 2023-05-23 VITALS — BP 139/53 | HR 70 | Temp 97.3°F | Resp 18 | Ht 63.0 in | Wt 161.0 lb

## 2023-05-23 DIAGNOSIS — C541 Malignant neoplasm of endometrium: Secondary | ICD-10-CM

## 2023-05-23 DIAGNOSIS — D61818 Other pancytopenia: Secondary | ICD-10-CM | POA: Diagnosis not present

## 2023-05-23 DIAGNOSIS — D61810 Antineoplastic chemotherapy induced pancytopenia: Secondary | ICD-10-CM | POA: Diagnosis not present

## 2023-05-23 DIAGNOSIS — K5909 Other constipation: Secondary | ICD-10-CM

## 2023-05-23 DIAGNOSIS — T451X5A Adverse effect of antineoplastic and immunosuppressive drugs, initial encounter: Secondary | ICD-10-CM | POA: Diagnosis not present

## 2023-05-23 DIAGNOSIS — G63 Polyneuropathy in diseases classified elsewhere: Secondary | ICD-10-CM

## 2023-05-23 NOTE — Assessment & Plan Note (Addendum)
 Recent pancytopenia due to chemotherapy I anticipate recovery in the future I will repeat labs again in 3 months

## 2023-05-23 NOTE — Assessment & Plan Note (Addendum)
 I am hopeful her peripheral neuropathy will improve slowly in the future

## 2023-05-23 NOTE — Progress Notes (Signed)
 [facility] OFFICE PROGRESS NOTE  Patient Care Team: Bari Boos, FNP as PCP - General (Nurse Practitioner)  Assessment & Plan Endometrial cancer [facility]) The patient was diagnosed with stage II endometrial cancer status post surgery, completed adjuvant chemotherapy in March 2025 Pathology: High grade serous, MMR normal, Her2 negative   She has completed chemotherapy recently, complicated by severe pancytopenia She is still recovering from side effects of treatment I reviewed imaging study which showed no evidence of disease She has appointment pending to see radiation oncologist in the future I will see her in 3 months with repeat labs We discussed port removal and she agreed Pancytopenia, acquired (HCC) Recent pancytopenia due to chemotherapy I anticipate recovery in the future I will repeat labs again in 3 months  Polyneuropathy associated with underlying disease (HCC) I am hopeful her peripheral neuropathy will improve slowly in the future Chronic constipation We discussed importance of adequate laxative therapy  Orders Placed This Encounter  Procedures   IR REMOVAL TUN ACCESS W/ PORT W/O FL MOD SED    Standing Status:   Future    Expected Date:   05/30/2023    Expiration Date:   05/22/2024    Reason for exam::   no need port anymore    Preferred Imaging Location?:   [facility]     Almeda Jacobs, MD  INTERVAL HISTORY: she returns for surveillance follow-up and review test results She has some mild neuropathy She denies constipation although CT imaging indicated signs of constipation I reviewed blood work and imaging studies with the patient and her daughter  PHYSICAL EXAMINATION: ECOG PERFORMANCE STATUS: 1 - Symptomatic but completely ambulatory  Vitals:   05/23/23 0857  BP: (!) 139/53  Pulse: 70  Resp: 18  Temp: (!) 97.3 F (36.3 C)  SpO2: 100%   Filed Weights   05/23/23 0857  Weight: 161 lb (73 kg)    Relevant data reviewed  during this visit included CBC, CMP and CT imaging

## 2023-05-23 NOTE — Assessment & Plan Note (Addendum)
 We discussed importance of adequate laxative therapy

## 2023-05-25 ENCOUNTER — Telehealth: Payer: Self-pay | Admitting: *Deleted

## 2023-05-25 NOTE — Telephone Encounter (Signed)
 Per Dr Marton Sleeper patient scheduled for a follow up appt with Dr Orvil Bland on 7/25 at 8:45 am. Patient's daughter aware of date/time

## 2023-05-31 ENCOUNTER — Telehealth: Payer: Self-pay | Admitting: Hematology and Oncology

## 2023-05-31 NOTE — Telephone Encounter (Signed)
 Left vm about added appt in July

## 2023-06-06 ENCOUNTER — Ambulatory Visit (HOSPITAL_COMMUNITY)
Admission: RE | Admit: 2023-06-06 | Discharge: 2023-06-06 | Disposition: A | Discharge status: Home / Self Care | Source: Ambulatory Visit | Attending: Hematology and Oncology

## 2023-06-06 DIAGNOSIS — C541 Malignant neoplasm of endometrium: Secondary | ICD-10-CM | POA: Diagnosis not present

## 2023-06-06 DIAGNOSIS — Z452 Encounter for adjustment and management of vascular access device: Secondary | ICD-10-CM | POA: Diagnosis present

## 2023-06-06 HISTORY — PX: IR REMOVAL TUN ACCESS W/ PORT W/O FL MOD SED: IMG2290

## 2023-06-06 MED ORDER — LIDOCAINE HCL 1 % IJ SOLN
20.0000 mL | Freq: Once | INTRAMUSCULAR | Status: AC
  Administered 2023-06-06: 10 mL via INTRADERMAL

## 2023-06-06 MED ORDER — LIDOCAINE HCL 1 % IJ SOLN
INTRAMUSCULAR | Status: AC
  Filled 2023-06-06: qty 20

## 2023-08-24 ENCOUNTER — Telehealth: Payer: Self-pay

## 2023-08-24 NOTE — Telephone Encounter (Signed)
 Left message for patient to call back and have appointment from 7/25 moved to 7/24.SABRASABRA

## 2023-08-24 NOTE — Telephone Encounter (Signed)
 Spoke with patient and had appointment changed from 7/25 to 7/24.. patient confirmed.SABRA

## 2023-08-25 ENCOUNTER — Inpatient Hospital Stay: Attending: Gynecologic Oncology | Admitting: Gynecologic Oncology

## 2023-08-25 ENCOUNTER — Encounter: Payer: Self-pay | Admitting: Gynecologic Oncology

## 2023-08-25 VITALS — BP 146/86 | HR 64 | Temp 98.1°F | Resp 19 | Wt 167.4 lb

## 2023-08-25 DIAGNOSIS — Z9221 Personal history of antineoplastic chemotherapy: Secondary | ICD-10-CM | POA: Diagnosis not present

## 2023-08-25 DIAGNOSIS — Z90710 Acquired absence of both cervix and uterus: Secondary | ICD-10-CM | POA: Insufficient documentation

## 2023-08-25 DIAGNOSIS — Z8542 Personal history of malignant neoplasm of other parts of uterus: Secondary | ICD-10-CM | POA: Diagnosis not present

## 2023-08-25 DIAGNOSIS — Z9079 Acquired absence of other genital organ(s): Secondary | ICD-10-CM | POA: Insufficient documentation

## 2023-08-25 DIAGNOSIS — G620 Drug-induced polyneuropathy: Secondary | ICD-10-CM | POA: Diagnosis not present

## 2023-08-25 DIAGNOSIS — C55 Malignant neoplasm of uterus, part unspecified: Secondary | ICD-10-CM

## 2023-08-25 DIAGNOSIS — Z90722 Acquired absence of ovaries, bilateral: Secondary | ICD-10-CM | POA: Diagnosis not present

## 2023-08-25 NOTE — Patient Instructions (Signed)
 It was good to see you today.  I do not see or feel any evidence of cancer recurrence on your exam.  I will see you for follow-up in 6 months.  As always, if you develop any new and concerning symptoms before your next visit, please call to see me sooner.  Please keep using your vaginal dilator!

## 2023-08-25 NOTE — Progress Notes (Signed)
 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    10/28/2022 Imaging   CT C/A/P: 1. Abnormally thickened endometrium (27 mm thickness), compatible with known primary endometrial malignancy. 2. No evidence of metastatic disease in the chest, abdomen or pelvis. 3. Moderate diffuse colonic diverticulosis. 4. One vessel coronary atherosclerosis. 5.  Aortic Atherosclerosis (ICD10-I70.0).   11/18/2022 Initial Diagnosis   Endometrial cancer (HCC)   11/18/2022 Surgery   Robotic-assisted laparoscopic total hysterectomy with bilateral salpingoophorectomy, SLN biopsy bilaterally, omentectomy, cystoscopy, vaginal repair   Findings: On EUA, enlarged mobile uterus. On intra-abdominal entry, normal upper abodminal survey. Normal omentum, small and large bowel. Uterus 12 cm with 4-5 cm calcified anterior fundal fibroid, mildly bulbous. Normal apeparing bilateral adnexa. Mapping successful to bilateral obturator SLNs, no obvious adenopathy. Right sulcal and right sidewall vaginal lacerations due to vaginal delivery of specimen, repaired. On cystoscopy, intact dome, good efflux from bilateral ureteral orifices.    11/25/2022 Cancer Staging   Staging form: Corpus Uteri - Carcinoma and Carcinosarcoma, AJCC 8th Edition - Pathologic stage from 11/25/2022: FIGO Stage II, calculated as Stage IB (pT1b, pN0, cM0) - Signed by [name], MD on 11/25/2022 Stage prefix: Initial diagnosis   12/10/2022 Procedure   Successful placement of a right IJ approach Power Port with ultrasound and fluoroscopic guidance. The catheter is ready for use.   12/17/2022 - 04/15/2023 Chemotherapy   Patient is on Treatment Plan : UTERINE Carboplatin  AUC 6 +  Paclitaxel  q21d     05/16/2023 Imaging   *No acute findings in the abdomen or pelvis. *Status post hysterectomy. No adnexal masses. No evidence of metastatic bone disease no evidence of metastatic disease. *Moderate amount of residual fecal material throughout the colon without obstruction or constipation.     06/06/2023 Procedure   Removal of implanted Port-A-Cath utilizing sharp and blunt dissection. The procedure was uncomplicated.     Interval History: Doing well.  Energy is back to pretreatment level.  Appetite has improved recently.  Denies any abdominal or pelvic pain.  Denies any vaginal bleeding.  Using her dilator.  Reports normal bowel and bladder function.  Developed some neuropathy after finishing chemotherapy.  Uses gabapentin as needed in the evening.  Does not always have symptoms.  Denies any issues with walking or her balance.  Past Medical/Surgical History: Past Medical History:  Diagnosis Date   ALLERGIC RHINITIS 05/01/2007   Allergy    Anemia    ANKLE PAIN, RIGHT 09/02/2009   ANXIETY, SITUATIONAL 09/02/2009   Cancer (HCC)    CARPAL TUNNEL SYNDROME, RIGHT 11/15/2006   Depression    DM w/o Complication Type II 11/15/2006   Generalized osteoarthritis 10/27/2012   bilat ankles, knees, hands and wrists   GERD 11/15/2006   GLAUCOMA 07/04/2008   Gout 11/14/2014   HYPERLIPIDEMIA 05/01/2007   HYPERTENSION 11/15/2006   HYPOKALEMIA 10/02/2008   Low back pain    OSTEOPENIA 09/02/2009   Peripheral neuropathy 11/14/2014   THUMB PAIN, RIGHT 04/02/2009    Past Surgical History:  Procedure Laterality Date   COLONOSCOPY     CYSTOSCOPY  11/18/2022   Procedure: CYSTOSCOPY;  Surgeon: [name], MD;  Location: [facility];  Service: Gynecology;;   IR IMAGING GUIDED PORT INSERTION  12/10/2022   IR REMOVAL TUN  ACCESS W/ PORT W/O FL MOD SED  06/06/2023   NO PAST SURGERIES     ROBOTIC ASSISTED TOTAL HYSTERECTOMY WITH BILATERAL SALPINGO OOPHERECTOMY N/A 11/18/2022    Procedure: XI ROBOTIC ASSISTED TOTAL HYSTERECTOMY WITH BILATERAL SALPINGO OOPHORECTOMY WITH OMENTECTOMY;  Surgeon: [name], MD;  Location: [facility];  Service: Gynecology;  Laterality: N/A;   SENTINEL NODE BIOPSY N/A 11/18/2022   Procedure: SENTINEL NODE BIOPSY;  Surgeon: [name], MD;  Location: [facility];  Service: Gynecology;  Laterality: N/A;    Family History  Problem Relation Age of Onset   Heart disease Mother    Hypertension Mother    Cancer Brother        throat cancer, smoker   Cancer Brother    Breast cancer Granddaughter    Cancer Niece    Breast cancer Niece    Heart disease Other    Hypertension Other    Colon cancer Neg Hx    Colon polyps Neg Hx    Esophageal cancer Neg Hx    Stomach cancer Neg Hx    Rectal cancer Neg Hx     Social History   Socioeconomic History   Marital status: Single    Spouse name: Not on file   Number of children: 3   Years of education: Not on file   Highest education level: Not on file  Occupational History   Occupation: FLOATER    Employer: [employer]   Occupation: Education officer, environmental, as well as Statistician   Occupation: retired  Tobacco Use   Smoking status: Never   Smokeless tobacco: Never  Vaping Use   Vaping status: Never Used  Substance and Sexual Activity   Alcohol use: No   Drug use: No   Sexual activity: Not on file  Other Topics Concern   Not on file  Social History Narrative   Not on file   Social Drivers of Health   Financial Resource Strain: Not on file  Food Insecurity: No Food Insecurity (01/15/2023)   Hunger Vital Sign    Worried About Running Out of Food in the Last Year: Never true    Ran Out of Food in the Last Year: Never true  Transportation Needs: No Transportation Needs (01/15/2023)   PRAPARE - Administrator, Civil Service (Medical): No    Lack of Transportation (Non-Medical): No  Physical Activity: Not on file  Stress: Not on file  Social Connections: Not on  file    Current Medications:  Current Outpatient Medications:    acetaminophen  (TYLENOL ) 325 MG tablet, Take 2 tablets (650 mg total) by mouth every 6 (six) hours as needed for mild pain (pain score 1-3) (or Fever >/= 101)., Disp: 20 tablet, Rfl: 0   allopurinol  (ZYLOPRIM ) 100 MG tablet, Take 100 mg by mouth daily., Disp: , Rfl:    aspirin  81 MG chewable tablet, Chew 81 mg by mouth daily. (Patient not taking: Reported on 08/24/2023), Disp: , Rfl:    Colchicine  0.6 MG CAPS, Take 1 capsule (0.6 mg total) by mouth 2 (two) times daily as needed (gout flare pain)., Disp: 30 capsule, Rfl: 0   docusate sodium  (COLACE) 100 MG capsule, Take 1 capsule (100 mg total) by mouth 2 (two) times daily., Disp: 10 capsule, Rfl: 0   gabapentin (NEURONTIN) 100 MG capsule, Take 100 mg by mouth at bedtime., Disp: , Rfl:    glimepiride  (AMARYL ) 2 MG tablet, Take 1 tablet (2 mg total) by mouth daily with breakfast.  TAKE ONE TABLET BY MOUTH ONCE DAILY BEFORE BREAKFAST, Disp: 90 tablet, Rfl: 0   lidocaine -prilocaine  (EMLA ) cream, Apply to affected area once, Disp: 30 g, Rfl: 3   olmesartan  (BENICAR ) 40 MG tablet, Take 1 tablet (40 mg total) by mouth daily., Disp: 30 tablet, Rfl: 11   polyethylene glycol (MIRALAX  / GLYCOLAX ) 17 g packet, Take 17 g by mouth at bedtime., Disp: 14 each, Rfl: 0   rosuvastatin  (CRESTOR ) 20 MG tablet, Take 1 tablet (20 mg total) by mouth daily. (Patient not taking: Reported on 08/24/2023), Disp: 90 tablet, Rfl: 3  Review of Systems: Denies appetite changes, fevers, chills, fatigue, unexplained weight changes. Denies hearing loss, neck lumps or masses, mouth sores, ringing in ears or voice changes. Denies cough or wheezing.  Denies shortness of breath. Denies chest pain or palpitations. Denies leg swelling. Denies abdominal distention, pain, blood in stools, constipation, diarrhea, nausea, vomiting, or early satiety. Denies pain with intercourse, dysuria, frequency, hematuria or  incontinence. Denies hot flashes, pelvic pain, vaginal bleeding or vaginal discharge.   Denies joint pain, back pain or muscle pain/cramps. Denies itching, rash, or wounds. Denies dizziness, headaches, numbness or seizures. Denies swollen lymph nodes or glands, denies easy bruising or bleeding. Denies anxiety, depression, confusion, or decreased concentration.  Physical Exam: There were no vitals taken for this visit. General: Alert, oriented, no acute distress. HEENT: Posterior oropharynx clear, sclera anicteric. Chest: Clear to auscultation bilaterally.  No wheezes or rhonchi. Cardiovascular: Regular rate and rhythm, no murmurs. Abdomen: soft, nontender.  Normoactive bowel sounds.  No masses or hepatosplenomegaly appreciated.  Well-healed incisions. Extremities: Grossly normal range of motion.  Warm, well perfused.  No edema bilaterally. Skin: No rashes or lesions noted. Lymphatics: No cervical, supraclavicular, or inguinal adenopathy. GU: Normal appearing external genitalia without erythema, excoriation, or lesions.  Speculum exam reveals cuff intact, mild radiation changes noted, no visible lesions.  Bimanual exam reveals no nodularity or masses.  Rectovaginal exam confirms findings.  Laboratory & Radiologic Studies: CT A/P 05/16/23: *No acute findings in the abdomen or pelvis. *Status post hysterectomy. No adnexal masses. No evidence of metastatic bone disease no evidence of metastatic disease. *Moderate amount of residual fecal material throughout the colon without obstruction or constipation.  Assessment & Plan: Alice Hickman is a 73 y.o. 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 MMRp. P53 abn. HER2 2+. Completed adjuvant chemotherapy and VBT in 04/2023. Post treatment CT in 05/2023 was negative for metastatic disease.  Doing well.  NED on exam today.  Encouraged continued vaginal dilator use.  Symptoms related to neuropathy are intermittent,  managed well with low-dose gabapentin at night.  In the setting of high risk histology, we will continue with visits every 3 months alternating between our office and radiation oncology.  I will see the patient back for follow-up in 6 months.  I reviewed signs and symptoms that should prompt a phone call before her next scheduled visit.  20 minutes of total time was spent for this patient encounter, including preparation, face-to-face counseling with the patient and coordination of care, and documentation of the encounter.  Comer Dollar, MD  Division of Gynecologic Oncology  Department of Obstetrics and Gynecology  [facility]

## 2023-08-26 ENCOUNTER — Inpatient Hospital Stay

## 2023-08-26 ENCOUNTER — Inpatient Hospital Stay: Admitting: Hematology and Oncology

## 2023-08-26 ENCOUNTER — Ambulatory Visit: Admitting: Gynecologic Oncology

## 2023-11-08 ENCOUNTER — Inpatient Hospital Stay

## 2023-11-08 ENCOUNTER — Encounter: Payer: Self-pay | Admitting: Hematology and Oncology

## 2023-11-08 ENCOUNTER — Inpatient Hospital Stay: Attending: Gynecologic Oncology | Admitting: Hematology and Oncology

## 2023-11-08 VITALS — BP 136/68 | HR 63 | Temp 98.3°F | Resp 18 | Ht 63.0 in | Wt 167.8 lb

## 2023-11-08 DIAGNOSIS — Z8542 Personal history of malignant neoplasm of other parts of uterus: Secondary | ICD-10-CM | POA: Diagnosis present

## 2023-11-08 DIAGNOSIS — Z9221 Personal history of antineoplastic chemotherapy: Secondary | ICD-10-CM | POA: Insufficient documentation

## 2023-11-08 DIAGNOSIS — D61818 Other pancytopenia: Secondary | ICD-10-CM | POA: Insufficient documentation

## 2023-11-08 DIAGNOSIS — C541 Malignant neoplasm of endometrium: Secondary | ICD-10-CM | POA: Diagnosis not present

## 2023-11-08 LAB — CBC WITH DIFFERENTIAL (CANCER CENTER ONLY)
Abs Immature Granulocytes: 0.01 K/uL (ref 0.00–0.07)
Basophils Absolute: 0 K/uL (ref 0.0–0.1)
Basophils Relative: 1 %
Eosinophils Absolute: 0.2 K/uL (ref 0.0–0.5)
Eosinophils Relative: 6 %
HCT: 34.4 % — ABNORMAL LOW (ref 36.0–46.0)
Hemoglobin: 11 g/dL — ABNORMAL LOW (ref 12.0–15.0)
Immature Granulocytes: 0 %
Lymphocytes Relative: 34 %
Lymphs Abs: 1.2 K/uL (ref 0.7–4.0)
MCH: 28.1 pg (ref 26.0–34.0)
MCHC: 32 g/dL (ref 30.0–36.0)
MCV: 87.8 fL (ref 80.0–100.0)
Monocytes Absolute: 0.3 K/uL (ref 0.1–1.0)
Monocytes Relative: 9 %
Neutro Abs: 1.9 K/uL (ref 1.7–7.7)
Neutrophils Relative %: 50 %
Platelet Count: 182 K/uL (ref 150–400)
RBC: 3.92 MIL/uL (ref 3.87–5.11)
RDW: 15 % (ref 11.5–15.5)
WBC Count: 3.6 K/uL — ABNORMAL LOW (ref 4.0–10.5)
nRBC: 0 % (ref 0.0–0.2)

## 2023-11-08 LAB — CMP (CANCER CENTER ONLY)
ALT: 11 U/L (ref 0–44)
AST: 15 U/L (ref 15–41)
Albumin: 3.7 g/dL (ref 3.5–5.0)
Alkaline Phosphatase: 57 U/L (ref 38–126)
Anion gap: 4 — ABNORMAL LOW (ref 5–15)
BUN: 14 mg/dL (ref 8–23)
CO2: 31 mmol/L (ref 22–32)
Calcium: 9.7 mg/dL (ref 8.9–10.3)
Chloride: 109 mmol/L (ref 98–111)
Creatinine: 1 mg/dL (ref 0.44–1.00)
GFR, Estimated: >60 mL/min (ref >60)
Glucose, Bld: 77 mg/dL (ref 70–99)
Potassium: 4 mmol/L (ref 3.5–5.1)
Sodium: 144 mmol/L (ref 135–145)
Total Bilirubin: 0.5 mg/dL (ref 0.0–1.2)
Total Protein: 7 g/dL (ref 6.5–8.1)

## 2023-11-08 NOTE — Assessment & Plan Note (Addendum)
 The patient was diagnosed with stage II endometrial cancer status post surgery, completed adjuvant chemotherapy in March 2025 Pathology: High grade serous, MMR normal, Her2 negative   She has completed chemotherapy recently in March 2025, completed by severe pancytopenia  Last imaging study from April 2025 showed no evidence of disease The patient is educated to watch out for signs and symptoms of disease recurrence  I plan to see her again in 6 months with repeat labs and imaging study

## 2023-11-08 NOTE — Progress Notes (Signed)
 [facility] OFFICE PROGRESS NOTE  Patient Care Team: [name], FNP as PCP - General (Nurse Practitioner)  Assessment & Plan Endometrial cancer [facility]) The patient was diagnosed with stage II endometrial cancer status post surgery, completed adjuvant chemotherapy in March 2025 Pathology: High grade serous, MMR normal, Her2 negative   She has completed chemotherapy recently in March 2025, completed by severe pancytopenia  Last imaging study from April 2025 showed no evidence of disease The patient is educated to watch out for signs and symptoms of disease recurrence  I plan to see her again in 6 months with repeat labs and imaging study Pancytopenia, acquired (HCC) She had pancytopenia from treatment Overall, her blood counts have improved dramatically We will repeated again in 6 months I will order vitamin B12 in her next visit  Orders Placed This Encounter  Procedures   CT ABDOMEN PELVIS W CONTRAST    Standing Status:   Future    Expected Date:   05/08/2024    Expiration Date:   11/07/2024    Scheduling Instructions:     No oral contrast    If indicated for the ordered procedure, I authorize the administration of contrast media per Radiology protocol:   Yes    Does the patient have a contrast media/X-ray dye allergy?:   No    Preferred imaging location?:   [facility]    If indicated for the ordered procedure, I authorize the administration of oral contrast media per Radiology protocol:   No    Reason for no oral contrast::   No need oral contrast   CMP (Cancer Center only)    Standing Status:   Future    Expiration Date:   11/07/2024   CBC with Differential (Cancer Center Only)    Standing Status:   Future    Expiration Date:   11/07/2024   Vitamin B12    Standing Status:   Future    Expiration Date:   11/07/2024     Almarie Bedford, MD  INTERVAL HISTORY: she returns for surveillance follow-up for treated uterine cancer She is here accompanied by  her daughter She is doing well Denies abdominal bloating or changes in bowel habits No signs or symptoms of anemia Denies recent infection No bruising or bleeding  PHYSICAL EXAMINATION: ECOG PERFORMANCE STATUS: 1 - Symptomatic but completely ambulatory  Vitals:   11/08/23 1009  BP: 136/68  Pulse: 63  Resp: 18  Temp: 98.3 F (36.8 C)  SpO2: 100%   Filed Weights   11/08/23 1009  Weight: 167 lb 12.8 oz (76.1 kg)    Relevant data reviewed during this visit included CBC and CMP

## 2023-11-08 NOTE — Assessment & Plan Note (Addendum)
 She had pancytopenia from treatment Overall, her blood counts have improved dramatically We will repeated again in 6 months I will order vitamin B12 in her next visit

## 2023-11-23 NOTE — Progress Notes (Signed)
 Radiation Oncology         [phone] ________________________________  Name: Alice Hickman MRN: [id number]  Date: 11/24/2023  DOB: 05-23-1950  Follow-Up Visit Note  CC: [name], FNP  [name], FNP  No diagnosis found.  Diagnosis: The encounter diagnosis was Endometrial cancer (HCC).   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; MMR normal; >50% myometrial invasion; negative for nodal involvement: s/p hysterectomy, BSO, and nodal biopsies        Cancer Staging  Endometrial cancer [facility]) Staging form: Corpus Uteri - Carcinoma and Carcinosarcoma, AJCC 8th Edition - Pathologic stage from 11/25/2022: FIGO Stage II, calculated as Stage IB (pT1b, pN0, cM0) - Signed by [name], MD on 11/25/2022  Interval Since Last Radiation:  10 months 18 days  Intent: curative  First Treatment Date: 2023-01-18 - Last Treatment Date: 2023-02-07 Site/Dose/Technique/Mode:  Site: Vagina Technique: HDR Ir-192 Mode: Brachytherapy Dose Per Fraction: 6 Gy Prescribed Dose (Delivered / Prescribed): 30 Gy / 30 Gy Prescribed Fxs (Delivered / Prescribed): 5 / 5  Narrative:  The patient returns today for routine follow-up. She was last seen in office on 05/19/23 for a follow up visit. Patient continued to follow up with their specialists to manage their chronic conditions.   In the interval since she was last seen, she presented for a follow up visit with Dr. Lonn on 05/23/23 during which she reported still recovering from side effects of treatment.    She followed up with Dr. Viktoria on 08/25/23 during which she reported developing minor neuropathy following chemotherapy for which she uses gabapentin for. Otherwise. She was noted NED on exam with no symptoms or concerns indication disease recurrence.                     Most recent visit with Dr. Lonn on 10/7, she was noted NED on exam with no symptoms or concerns indication disease recurrence.                      No other significant oncologic interval history since the patient was last seen.                                 Allergies:  is allergic to penicillins, lovastatin, and metformin.  Meds: Current Outpatient Medications  Medication Sig Dispense Refill   acetaminophen  (TYLENOL ) 325 MG tablet Take 2 tablets (650 mg total) by mouth every 6 (six) hours as needed for mild pain (pain score 1-3) (or Fever >/= 101). 20 tablet 0   allopurinol  (ZYLOPRIM ) 100 MG tablet Take 100 mg by mouth daily.     aspirin  81 MG chewable tablet Chew 81 mg by mouth daily. (Patient not taking: Reported on 08/24/2023)     Colchicine  0.6 MG CAPS Take 1 capsule (0.6 mg total) by mouth 2 (two) times daily as needed (gout flare pain). 30 capsule 0   docusate sodium  (COLACE) 100 MG capsule Take 1 capsule (100 mg total) by mouth 2 (two) times daily. 10 capsule 0   gabapentin (NEURONTIN) 100 MG capsule Take 100 mg by mouth at bedtime.     glimepiride  (AMARYL ) 2 MG tablet Take 1 tablet (2 mg total) by mouth daily with breakfast. TAKE ONE TABLET BY MOUTH ONCE DAILY BEFORE BREAKFAST 90 tablet 0   lidocaine -prilocaine  (EMLA ) cream Apply to affected area once 30 g 3  olmesartan  (BENICAR ) 40 MG tablet Take 1 tablet (40 mg total) by mouth daily. 30 tablet 11   polyethylene glycol (MIRALAX  / GLYCOLAX ) 17 g packet Take 17 g by mouth at bedtime. 14 each 0   rosuvastatin  (CRESTOR ) 20 MG tablet Take 1 tablet (20 mg total) by mouth daily. (Patient not taking: Reported on 08/24/2023) 90 tablet 3   No current facility-administered medications for this visit.    Physical Findings: The patient is in no acute distress. Patient is alert and oriented.  vitals were not taken for this visit. .  No significant changes. Lungs are clear to auscultation bilaterally. Heart has regular rate and rhythm. No palpable cervical, supraclavicular, or axillary adenopathy. Abdomen soft, non-tender, normal bowel sounds.   Lab Findings: Lab  Results  Component Value Date   WBC 3.6 (L) 11/08/2023   HGB 11.0 (L) 11/08/2023   HCT 34.4 (L) 11/08/2023   MCV 87.8 11/08/2023   PLT 182 11/08/2023    Radiographic Findings: No results found.  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  ***  Plan:  ***   *** minutes of total time was spent for this patient encounter, including preparation, face-to-face counseling with the patient and coordination of care, physical exam, and documentation of the encounter. ____________________________________  Lynwood CHARM Nasuti, PhD, MD  This document serves as a record of services personally performed by Lynwood Nasuti, MD. It was created on his behalf by Reymundo Cartwright, a trained medical scribe. The creation of this record is based on the scribe's personal observations and the provider's statements to them. This document has been checked and approved by the attending provider.

## 2023-11-24 ENCOUNTER — Encounter: Payer: Self-pay | Admitting: Radiation Oncology

## 2023-11-24 ENCOUNTER — Ambulatory Visit
Admission: RE | Admit: 2023-11-24 | Discharge: 2023-11-24 | Disposition: A | Discharge status: Home / Self Care | Payer: Self-pay | Source: Ambulatory Visit | Attending: Radiation Oncology | Admitting: Radiation Oncology

## 2023-11-24 VITALS — BP 163/84 | HR 85 | Temp 97.6°F | Resp 20 | Ht 60.0 in | Wt 171.2 lb

## 2023-11-24 DIAGNOSIS — Z7984 Long term (current) use of oral hypoglycemic drugs: Secondary | ICD-10-CM | POA: Insufficient documentation

## 2023-11-24 DIAGNOSIS — Z7982 Long term (current) use of aspirin: Secondary | ICD-10-CM | POA: Insufficient documentation

## 2023-11-24 DIAGNOSIS — Z9221 Personal history of antineoplastic chemotherapy: Secondary | ICD-10-CM | POA: Insufficient documentation

## 2023-11-24 DIAGNOSIS — G620 Drug-induced polyneuropathy: Secondary | ICD-10-CM | POA: Insufficient documentation

## 2023-11-24 DIAGNOSIS — Z79899 Other long term (current) drug therapy: Secondary | ICD-10-CM | POA: Diagnosis not present

## 2023-11-24 DIAGNOSIS — C541 Malignant neoplasm of endometrium: Secondary | ICD-10-CM | POA: Diagnosis present

## 2023-11-24 NOTE — Progress Notes (Signed)
 Bentleigh J Bittinger is here today for follow up post radiation to the pelvis.  They completed their radiation on: 02/07/23   Does the patient complain of any of the following:  Pain:No Abdominal bloating: No Diarrhea/Constipation: No Nausea/Vomiting: No Vaginal Discharge: No Blood in Urine or Stool: NO Urinary Issues (dysuria/incomplete emptying/ incontinence/ increased frequency/urgency): No Does patient report using vaginal dilator 2-3 times a week and/or sexually active 2-3 weeks: Yes  Post radiation skin changes: No    Additional comments if applicable:   BP (!) 163/84 (BP Location: Right Arm, Patient Position: Sitting, Cuff Size: Large)   Pulse 85   Temp 97.6 F (36.4 C)   Resp 20   Ht 5' (1.524 m)   Wt 171 lb 3.2 oz (77.7 kg)   SpO2 100%   BMI 33.44 kg/m

## 2024-02-24 ENCOUNTER — Inpatient Hospital Stay: Attending: Gynecologic Oncology | Admitting: Gynecologic Oncology

## 2024-02-24 ENCOUNTER — Encounter: Payer: Self-pay | Admitting: Gynecologic Oncology

## 2024-02-24 VITALS — BP 140/65 | HR 85 | Temp 98.6°F | Resp 19 | Wt 176.8 lb

## 2024-02-24 DIAGNOSIS — Z90710 Acquired absence of both cervix and uterus: Secondary | ICD-10-CM | POA: Insufficient documentation

## 2024-02-24 DIAGNOSIS — G620 Drug-induced polyneuropathy: Secondary | ICD-10-CM | POA: Insufficient documentation

## 2024-02-24 DIAGNOSIS — Z90722 Acquired absence of ovaries, bilateral: Secondary | ICD-10-CM | POA: Insufficient documentation

## 2024-02-24 DIAGNOSIS — Z9221 Personal history of antineoplastic chemotherapy: Secondary | ICD-10-CM | POA: Insufficient documentation

## 2024-02-24 DIAGNOSIS — Z9079 Acquired absence of other genital organ(s): Secondary | ICD-10-CM | POA: Diagnosis not present

## 2024-02-24 DIAGNOSIS — Z79899 Other long term (current) drug therapy: Secondary | ICD-10-CM | POA: Diagnosis not present

## 2024-02-24 DIAGNOSIS — Z8542 Personal history of malignant neoplasm of other parts of uterus: Secondary | ICD-10-CM | POA: Diagnosis present

## 2024-02-24 DIAGNOSIS — C55 Malignant neoplasm of uterus, part unspecified: Secondary | ICD-10-CM

## 2024-02-24 NOTE — Progress Notes (Signed)
 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    10/28/2022 Imaging   CT C/A/P: 1. Abnormally thickened endometrium (27 mm thickness), compatible with known primary endometrial malignancy. 2. No evidence of metastatic disease in the chest, abdomen or pelvis. 3. Moderate diffuse colonic diverticulosis. 4. One vessel coronary atherosclerosis. 5.  Aortic Atherosclerosis (ICD10-I70.0).   11/18/2022 Initial Diagnosis   Endometrial cancer (HCC)   11/18/2022 Surgery   Robotic-assisted laparoscopic total hysterectomy with bilateral salpingoophorectomy, SLN biopsy bilaterally, omentectomy, cystoscopy, vaginal repair   Findings: On EUA, enlarged mobile uterus. On intra-abdominal entry, normal upper abodminal survey. Normal omentum, small and large bowel. Uterus 12 cm with 4-5 cm calcified anterior fundal fibroid, mildly bulbous. Normal apeparing bilateral adnexa. Mapping successful to bilateral obturator SLNs, no obvious adenopathy. Right sulcal and right sidewall vaginal lacerations due to vaginal delivery of specimen, repaired. On cystoscopy, intact dome, good efflux from bilateral ureteral orifices.    11/25/2022 Cancer Staging   Staging form: Corpus Uteri - Carcinoma and Carcinosarcoma, AJCC 8th Edition - Pathologic stage from 11/25/2022: FIGO Stage II, calculated as Stage IB (pT1b, pN0, cM0) - Signed by [name], MD on 11/25/2022 Stage prefix: Initial diagnosis   12/10/2022 Procedure   Successful placement of a right IJ approach Power Port with ultrasound and fluoroscopic guidance. The catheter is ready for use.   12/17/2022 - 04/15/2023 Chemotherapy   Patient is on Treatment Plan : UTERINE Carboplatin  AUC 6 +  Paclitaxel  q21d     05/16/2023 Imaging   *No acute findings in the abdomen or pelvis. *Status post hysterectomy. No adnexal masses. No evidence of metastatic bone disease no evidence of metastatic disease. *Moderate amount of residual fecal material throughout the colon without obstruction or constipation.     06/06/2023 Procedure   Removal of implanted Port-A-Cath utilizing sharp and blunt dissection. The procedure was uncomplicated.     Interval History: Doing well.  Still has neuropathy, no significant change.  Typically does better when she takes her dose of gabapentin but if she misses 1, she will have increased symptoms such as numbness and tingling.  Has also been taking B12 in the morning which seems to be helping some.  Denies any vaginal bleeding.  Using her vaginal dilator once a week.  Reports normal bowel and bladder function.  Past Medical/Surgical History: Past Medical History:  Diagnosis Date   ALLERGIC RHINITIS 05/01/2007   Allergy    Anemia    ANKLE PAIN, RIGHT 09/02/2009   ANXIETY, SITUATIONAL 09/02/2009   Cancer (HCC)    CARPAL TUNNEL SYNDROME, RIGHT 11/15/2006   Depression    DM w/o Complication Type II 11/15/2006   Generalized osteoarthritis 10/27/2012   bilat ankles, knees, hands and wrists   GERD 11/15/2006   GLAUCOMA 07/04/2008   Gout 11/14/2014   HYPERLIPIDEMIA 05/01/2007   HYPERTENSION 11/15/2006   HYPOKALEMIA 10/02/2008   Low back pain    OSTEOPENIA 09/02/2009   Peripheral neuropathy 11/14/2014   THUMB PAIN, RIGHT 04/02/2009    Past Surgical History:  Procedure Laterality Date   COLONOSCOPY     CYSTOSCOPY  11/18/2022   Procedure: CYSTOSCOPY;  Surgeon: [name], MD;  Location: [facility];  Service: Gynecology;;   IR IMAGING GUIDED PORT INSERTION  12/10/2022  IR REMOVAL TUN ACCESS W/ PORT W/O FL MOD SED  06/06/2023   NO PAST SURGERIES     ROBOTIC ASSISTED TOTAL HYSTERECTOMY WITH BILATERAL SALPINGO OOPHERECTOMY N/A 11/18/2022    Procedure: XI ROBOTIC ASSISTED TOTAL HYSTERECTOMY WITH BILATERAL SALPINGO OOPHORECTOMY WITH OMENTECTOMY;  Surgeon: [name], MD;  Location: [facility];  Service: Gynecology;  Laterality: N/A;   SENTINEL NODE BIOPSY N/A 11/18/2022   Procedure: SENTINEL NODE BIOPSY;  Surgeon: [name], MD;  Location: [facility];  Service: Gynecology;  Laterality: N/A;    Family History  Problem Relation Age of Onset   Heart disease Mother    Hypertension Mother    Cancer Brother        throat cancer, smoker   Cancer Brother    Breast cancer Granddaughter    Cancer Niece    Breast cancer Niece    Heart disease Other    Hypertension Other    Colon cancer Neg Hx    Colon polyps Neg Hx    Esophageal cancer Neg Hx    Stomach cancer Neg Hx    Rectal cancer Neg Hx     Social History   Socioeconomic History   Marital status: Single    Spouse name: Not on file   Number of children: 3   Years of education: Not on file   Highest education level: Not on file  Occupational History   Occupation: FLOATER    Employer: [employer]   Occupation: education officer, environmental, as well as statistician   Occupation: retired  Tobacco Use   Smoking status: Never   Smokeless tobacco: Never  Vaping Use   Vaping status: Never Used  Substance and Sexual Activity   Alcohol use: No   Drug use: No   Sexual activity: Not on file  Other Topics Concern   Not on file  Social History Narrative   Not on file   Social Drivers of Health   Tobacco Use: Low Risk (02/24/2024)   Patient History    Smoking Tobacco Use: Never    Smokeless Tobacco Use: Never    Passive Exposure: Not on file  Financial Resource Strain: Not on file  Food Insecurity: No Food Insecurity (01/15/2023)   Hunger Vital Sign    Worried About Running Out of Food in the Last Year: Never true    Ran Out of Food in the Last Year: Never true  Transportation Needs: No Transportation Needs (01/15/2023)   PRAPARE - Scientist, Research (physical Sciences) (Medical): No    Lack of Transportation (Non-Medical): No  Physical Activity: Not on file  Stress: Not on file  Social Connections: Not on file  Depression (PHQ2-9): Low Risk (12/06/2022)   Depression (PHQ2-9)    PHQ-2 Score: 0  Alcohol Screen: Not on file  Housing: Low Risk (01/15/2023)   Housing Stability Vital Sign    Unable to Pay for Housing in the Last Year: No    Number of Times Moved in the Last Year: 0    Homeless in the Last Year: No  Utilities: Not At Risk (01/15/2023)   [facility] Utilities    Threatened with loss of utilities: No  Health Literacy: Not on file    Current Medications: Current Medications[1]  Review of Systems: + neuropathy Denies appetite changes, fevers, chills, fatigue, unexplained weight changes. Denies hearing loss, neck lumps or masses, mouth sores, ringing in ears or voice changes. Denies cough or wheezing.  Denies shortness of breath. Denies chest  pain or palpitations. Denies leg swelling. Denies abdominal distention, pain, blood in stools, constipation, diarrhea, nausea, vomiting, or early satiety. Denies pain with intercourse, dysuria, frequency, hematuria or incontinence. Denies hot flashes, pelvic pain, vaginal bleeding or vaginal discharge.   Denies joint pain, back pain or muscle pain/cramps. Denies itching, rash, or wounds. Denies dizziness, headaches or seizures. Denies swollen lymph nodes or glands, denies easy bruising or bleeding. Denies anxiety, depression, confusion, or decreased concentration.  Physical Exam: BP (!) 140/65 (BP Location: Left Arm, Patient Position: Sitting)   Pulse 85   Temp 98.6 F (37 C) (Oral)   Resp 19   Wt 176 lb 12.8 oz (80.2 kg)   SpO2 100%   BMI 34.53 kg/m  General: Alert, oriented, no acute distress. HEENT: Posterior oropharynx clear, sclera anicteric. Chest: Clear to auscultation bilaterally.  No wheezes or rhonchi. Cardiovascular: Regular rate and rhythm, no murmurs. Abdomen: soft,  nontender.  Normoactive bowel sounds.  No masses or hepatosplenomegaly appreciated.  Well-healed incisions. Extremities: Grossly normal range of motion.  Warm, well perfused.  No edema bilaterally. Skin: No rashes or lesions noted. Lymphatics: No cervical, supraclavicular, or inguinal adenopathy. GU: Normal appearing external genitalia without erythema, excoriation, or lesions.  Speculum exam reveals cuff intact, mild radiation changes noted with minimal scarring at the apex in the midline, no visible lesions.  Bimanual exam reveals no nodularity or masses.  Rectovaginal exam confirms findings.  Laboratory & Radiologic Studies: None new  Assessment & Plan: Alice Hickman is a 74 y.o. 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 MMRp. P53 abn. HER2 2+ (negative on FISH). Completed adjuvant chemotherapy and VBT in 04/2023. Post treatment CT in 05/2023 was negative for metastatic disease.   Doing well.  NED on exam today.   Encouraged continued vaginal dilator use.   Symptoms related to neuropathy are intermittent, managed well with low-dose gabapentin at night.  Discussed that they could do a trial of 200 mg of gabapentin at night to see if this helps.  She will to see Dr. Lonn in April with imaging around that time.   In the setting of high risk histology, we will continue with visits every 3 months alternating between our office and radiation oncology.  I will see the patient back for follow-up in 6 months.  I reviewed signs and symptoms that should prompt a phone call before her next scheduled visit.  22 minutes of total time was spent for this patient encounter, including preparation, face-to-face counseling with the patient and coordination of care, and documentation of the encounter.  Comer Dollar, MD  Division of Gynecologic Oncology  Department of Obstetrics and Gynecology  [facility]      [1]  Current Outpatient  Medications:    acetaminophen  (TYLENOL ) 325 MG tablet, Take 2 tablets (650 mg total) by mouth every 6 (six) hours as needed for mild pain (pain score 1-3) (or Fever >/= 101)., Disp: 20 tablet, Rfl: 0   allopurinol  (ZYLOPRIM ) 100 MG tablet, Take 100 mg by mouth daily., Disp: , Rfl:    Colchicine  0.6 MG CAPS, Take 1 capsule (0.6 mg total) by mouth 2 (two) times daily as needed (gout flare pain)., Disp: 30 capsule, Rfl: 0   docusate sodium  (COLACE) 100 MG capsule, Take 1 capsule (100 mg total) by mouth 2 (two) times daily., Disp: 10 capsule, Rfl: 0   gabapentin (NEURONTIN) 100 MG capsule, Take 100 mg by mouth at bedtime., Disp: , Rfl:  glimepiride  (AMARYL ) 2 MG tablet, Take 1 tablet (2 mg total) by mouth daily with breakfast. TAKE ONE TABLET BY MOUTH ONCE DAILY BEFORE BREAKFAST, Disp: 90 tablet, Rfl: 0   Glimepiride  3 MG TABS, Glimepiride , Disp: , Rfl:    olmesartan  (BENICAR ) 40 MG tablet, Take 1 tablet (40 mg total) by mouth daily., Disp: 30 tablet, Rfl: 11   polyethylene glycol (MIRALAX  / GLYCOLAX ) 17 g packet, Take 17 g by mouth at bedtime., Disp: 14 each, Rfl: 0

## 2024-02-24 NOTE — Patient Instructions (Signed)
 It was good to see you today.  I do not see or feel any evidence of cancer recurrence on your exam.  I will see you for follow-up in 6 months.  As always, if you develop any new and concerning symptoms before your next visit, please call to see me sooner.

## 2024-05-08 ENCOUNTER — Inpatient Hospital Stay

## 2024-05-08 ENCOUNTER — Inpatient Hospital Stay: Admitting: Hematology and Oncology

## 2024-05-24 ENCOUNTER — Ambulatory Visit: Admitting: Radiation Oncology

## 2024-08-17 ENCOUNTER — Inpatient Hospital Stay: Admitting: Gynecologic Oncology
